# Patient Record
Sex: Male | Born: 1975 | Race: Black or African American | Hispanic: No | Marital: Single | Smoking: Never smoker
Health system: Southern US, Community
[De-identification: ages and names within clinical notes are randomized; demographics above are authoritative.]

## PROBLEM LIST (undated history)

## (undated) DIAGNOSIS — R569 Unspecified convulsions: Secondary | ICD-10-CM

## (undated) DIAGNOSIS — F70 Mild intellectual disabilities: Secondary | ICD-10-CM

## (undated) DIAGNOSIS — I10 Essential (primary) hypertension: Secondary | ICD-10-CM

## (undated) DIAGNOSIS — D332 Benign neoplasm of brain, unspecified: Secondary | ICD-10-CM

## (undated) HISTORY — PX: BRAIN SURGERY: SHX531

## (undated) HISTORY — DX: Essential (primary) hypertension: I10

## (undated) HISTORY — DX: Mild intellectual disabilities: F70

## (undated) HISTORY — DX: Unspecified convulsions: R56.9

## (undated) HISTORY — DX: Benign neoplasm of brain, unspecified: D33.2

---

## 1998-02-04 ENCOUNTER — Encounter: Admission: RE | Admit: 1998-02-04 | Discharge: 1998-02-04 | Payer: Self-pay | Admitting: Family Medicine

## 1998-06-10 ENCOUNTER — Encounter: Admission: RE | Admit: 1998-06-10 | Discharge: 1998-06-10 | Payer: Self-pay | Admitting: Sports Medicine

## 1998-07-05 ENCOUNTER — Emergency Department (HOSPITAL_COMMUNITY): Admission: EM | Admit: 1998-07-05 | Discharge: 1998-07-05 | Payer: Self-pay | Admitting: Emergency Medicine

## 1998-07-09 ENCOUNTER — Inpatient Hospital Stay (HOSPITAL_COMMUNITY): Admission: RE | Admit: 1998-07-09 | Discharge: 1998-07-12 | Payer: Self-pay | Admitting: Otolaryngology

## 1998-10-16 ENCOUNTER — Emergency Department (HOSPITAL_COMMUNITY): Admission: EM | Admit: 1998-10-16 | Discharge: 1998-10-16 | Payer: Self-pay | Admitting: Emergency Medicine

## 1998-11-26 ENCOUNTER — Encounter: Admission: RE | Admit: 1998-11-26 | Discharge: 1998-11-26 | Payer: Self-pay | Admitting: Family Medicine

## 1999-04-17 ENCOUNTER — Encounter: Admission: RE | Admit: 1999-04-17 | Discharge: 1999-04-17 | Payer: Self-pay | Admitting: Family Medicine

## 1999-06-02 ENCOUNTER — Encounter: Admission: RE | Admit: 1999-06-02 | Discharge: 1999-06-02 | Payer: Self-pay | Admitting: Family Medicine

## 1999-06-07 ENCOUNTER — Emergency Department (HOSPITAL_COMMUNITY): Admission: EM | Admit: 1999-06-07 | Discharge: 1999-06-07 | Payer: Self-pay | Admitting: Emergency Medicine

## 1999-06-09 ENCOUNTER — Encounter: Admission: RE | Admit: 1999-06-09 | Discharge: 1999-06-09 | Payer: Self-pay | Admitting: Family Medicine

## 1999-07-21 ENCOUNTER — Encounter: Admission: RE | Admit: 1999-07-21 | Discharge: 1999-07-21 | Payer: Self-pay | Admitting: Family Medicine

## 1999-08-12 ENCOUNTER — Encounter: Payer: Self-pay | Admitting: Pediatrics

## 1999-08-12 ENCOUNTER — Ambulatory Visit (HOSPITAL_COMMUNITY): Admission: RE | Admit: 1999-08-12 | Discharge: 1999-08-12 | Payer: Self-pay | Admitting: Pediatrics

## 2001-10-31 ENCOUNTER — Emergency Department (HOSPITAL_COMMUNITY): Admission: EM | Admit: 2001-10-31 | Discharge: 2001-10-31 | Payer: Self-pay | Admitting: Emergency Medicine

## 2002-02-06 ENCOUNTER — Emergency Department (HOSPITAL_COMMUNITY): Admission: EM | Admit: 2002-02-06 | Discharge: 2002-02-06 | Payer: Self-pay

## 2002-05-02 ENCOUNTER — Emergency Department (HOSPITAL_COMMUNITY): Admission: EM | Admit: 2002-05-02 | Discharge: 2002-05-02 | Payer: Self-pay | Admitting: Emergency Medicine

## 2004-11-11 ENCOUNTER — Ambulatory Visit: Payer: Self-pay | Admitting: Family Medicine

## 2004-11-13 ENCOUNTER — Ambulatory Visit (HOSPITAL_COMMUNITY): Admission: RE | Admit: 2004-11-13 | Discharge: 2004-11-13 | Payer: Self-pay | Admitting: Family Medicine

## 2004-11-25 ENCOUNTER — Ambulatory Visit: Payer: Self-pay | Admitting: Family Medicine

## 2005-05-06 ENCOUNTER — Inpatient Hospital Stay (HOSPITAL_COMMUNITY): Admission: EM | Admit: 2005-05-06 | Discharge: 2005-05-09 | Payer: Self-pay | Admitting: Emergency Medicine

## 2005-05-06 ENCOUNTER — Emergency Department (HOSPITAL_COMMUNITY): Admission: EM | Admit: 2005-05-06 | Discharge: 2005-05-06 | Payer: Self-pay | Admitting: Emergency Medicine

## 2005-05-12 ENCOUNTER — Ambulatory Visit: Payer: Self-pay | Admitting: Infectious Diseases

## 2005-05-12 ENCOUNTER — Inpatient Hospital Stay (HOSPITAL_COMMUNITY): Admission: EM | Admit: 2005-05-12 | Discharge: 2005-06-08 | Payer: Self-pay | Admitting: Emergency Medicine

## 2006-07-01 ENCOUNTER — Emergency Department (HOSPITAL_COMMUNITY): Admission: EM | Admit: 2006-07-01 | Discharge: 2006-07-01 | Payer: Self-pay | Admitting: Emergency Medicine

## 2006-10-21 ENCOUNTER — Emergency Department (HOSPITAL_COMMUNITY): Admission: EM | Admit: 2006-10-21 | Discharge: 2006-10-21 | Payer: Self-pay | Admitting: Emergency Medicine

## 2006-10-27 ENCOUNTER — Emergency Department (HOSPITAL_COMMUNITY): Admission: EM | Admit: 2006-10-27 | Discharge: 2006-10-27 | Payer: Self-pay | Admitting: Emergency Medicine

## 2006-11-16 ENCOUNTER — Emergency Department (HOSPITAL_COMMUNITY): Admission: EM | Admit: 2006-11-16 | Discharge: 2006-11-16 | Payer: Self-pay | Admitting: Emergency Medicine

## 2006-11-16 ENCOUNTER — Encounter: Payer: Self-pay | Admitting: Internal Medicine

## 2006-12-14 ENCOUNTER — Emergency Department (HOSPITAL_COMMUNITY): Admission: EM | Admit: 2006-12-14 | Discharge: 2006-12-14 | Payer: Self-pay | Admitting: Emergency Medicine

## 2006-12-16 ENCOUNTER — Emergency Department (HOSPITAL_COMMUNITY): Admission: EM | Admit: 2006-12-16 | Discharge: 2006-12-16 | Payer: Self-pay | Admitting: Emergency Medicine

## 2007-01-28 ENCOUNTER — Emergency Department (HOSPITAL_COMMUNITY): Admission: EM | Admit: 2007-01-28 | Discharge: 2007-01-28 | Payer: Self-pay | Admitting: Family Medicine

## 2007-01-28 ENCOUNTER — Encounter: Payer: Self-pay | Admitting: Internal Medicine

## 2007-02-08 ENCOUNTER — Ambulatory Visit: Payer: Self-pay | Admitting: Family Medicine

## 2007-02-28 ENCOUNTER — Encounter: Payer: Self-pay | Admitting: Internal Medicine

## 2007-02-28 ENCOUNTER — Encounter (INDEPENDENT_AMBULATORY_CARE_PROVIDER_SITE_OTHER): Payer: Self-pay | Admitting: Specialist

## 2007-02-28 ENCOUNTER — Observation Stay (HOSPITAL_COMMUNITY): Admission: RE | Admit: 2007-02-28 | Discharge: 2007-02-28 | Payer: Self-pay | Admitting: Neurosurgery

## 2007-05-05 ENCOUNTER — Ambulatory Visit: Payer: Self-pay | Admitting: Family Medicine

## 2007-05-13 ENCOUNTER — Encounter: Payer: Self-pay | Admitting: Internal Medicine

## 2007-05-20 ENCOUNTER — Encounter: Payer: Self-pay | Admitting: Internal Medicine

## 2007-05-26 ENCOUNTER — Ambulatory Visit: Payer: Self-pay | Admitting: Internal Medicine

## 2007-06-17 ENCOUNTER — Encounter: Payer: Self-pay | Admitting: Internal Medicine

## 2007-06-22 DIAGNOSIS — T85738A Infection and inflammatory reaction due to other nervous system device, implant or graft, initial encounter: Secondary | ICD-10-CM | POA: Insufficient documentation

## 2007-06-23 ENCOUNTER — Ambulatory Visit: Payer: Self-pay | Admitting: Family Medicine

## 2007-06-28 ENCOUNTER — Ambulatory Visit: Payer: Self-pay | Admitting: Internal Medicine

## 2007-06-28 DIAGNOSIS — G40209 Localization-related (focal) (partial) symptomatic epilepsy and epileptic syndromes with complex partial seizures, not intractable, without status epilepticus: Secondary | ICD-10-CM

## 2007-06-28 DIAGNOSIS — I619 Nontraumatic intracerebral hemorrhage, unspecified: Secondary | ICD-10-CM

## 2007-06-28 DIAGNOSIS — Z9889 Other specified postprocedural states: Secondary | ICD-10-CM | POA: Insufficient documentation

## 2007-06-28 DIAGNOSIS — G911 Obstructive hydrocephalus: Secondary | ICD-10-CM

## 2007-07-07 ENCOUNTER — Inpatient Hospital Stay (HOSPITAL_COMMUNITY): Admission: RE | Admit: 2007-07-07 | Discharge: 2007-07-08 | Payer: Self-pay | Admitting: Neurosurgery

## 2007-07-07 ENCOUNTER — Encounter: Payer: Self-pay | Admitting: Internal Medicine

## 2007-07-15 ENCOUNTER — Encounter: Payer: Self-pay | Admitting: Internal Medicine

## 2007-07-21 ENCOUNTER — Ambulatory Visit (HOSPITAL_COMMUNITY): Admission: RE | Admit: 2007-07-21 | Discharge: 2007-07-21 | Payer: Self-pay | Admitting: Neurosurgery

## 2007-07-29 ENCOUNTER — Encounter: Payer: Self-pay | Admitting: Internal Medicine

## 2007-08-12 ENCOUNTER — Encounter: Payer: Self-pay | Admitting: Internal Medicine

## 2007-09-05 DIAGNOSIS — H5310 Unspecified subjective visual disturbances: Secondary | ICD-10-CM | POA: Insufficient documentation

## 2007-09-05 DIAGNOSIS — I10 Essential (primary) hypertension: Secondary | ICD-10-CM | POA: Insufficient documentation

## 2007-09-05 DIAGNOSIS — F70 Mild intellectual disabilities: Secondary | ICD-10-CM | POA: Insufficient documentation

## 2007-09-05 DIAGNOSIS — H4720 Unspecified optic atrophy: Secondary | ICD-10-CM | POA: Insufficient documentation

## 2007-10-11 ENCOUNTER — Encounter: Payer: Self-pay | Admitting: Infectious Diseases

## 2008-04-13 ENCOUNTER — Emergency Department (HOSPITAL_COMMUNITY): Admission: EM | Admit: 2008-04-13 | Discharge: 2008-04-13 | Payer: Self-pay | Admitting: Emergency Medicine

## 2008-05-04 ENCOUNTER — Ambulatory Visit: Payer: Self-pay | Admitting: Family Medicine

## 2008-05-04 LAB — CONVERTED CEMR LAB
Albumin: 4.6 g/dL (ref 3.5–5.2)
BUN: 14 mg/dL (ref 6–23)
Calcium: 9.6 mg/dL (ref 8.4–10.5)
Chloride: 100 meq/L (ref 96–112)
Creatinine, Ser: 0.97 mg/dL (ref 0.40–1.50)
Eosinophils Absolute: 0 10*3/uL (ref 0.0–0.7)
Glucose, Bld: 85 mg/dL (ref 70–99)
HDL: 47 mg/dL (ref >39)
Hemoglobin: 13.7 g/dL (ref 13.0–17.0)
Lymphs Abs: 1.3 10*3/uL (ref 0.7–4.0)
MCHC: 32.4 g/dL (ref 30.0–36.0)
MCV: 88.5 fL (ref 78.0–100.0)
Monocytes Absolute: 0.3 10*3/uL (ref 0.1–1.0)
Monocytes Relative: 7 % (ref 3–12)
Neutro Abs: 2.5 10*3/uL (ref 1.7–7.7)
Neutrophils Relative %: 61 % (ref 43–77)
Potassium: 3.8 meq/L (ref 3.5–5.3)
RBC: 4.78 M/uL (ref 4.22–5.81)
Total CHOL/HDL Ratio: 3.9
Triglycerides: 103 mg/dL (ref <150)
WBC: 4.2 10*3/uL (ref 4.0–10.5)

## 2008-05-30 ENCOUNTER — Ambulatory Visit (HOSPITAL_BASED_OUTPATIENT_CLINIC_OR_DEPARTMENT_OTHER): Admission: RE | Admit: 2008-05-30 | Discharge: 2008-05-30 | Payer: Self-pay | Admitting: Orthopedic Surgery

## 2008-11-09 ENCOUNTER — Ambulatory Visit: Payer: Self-pay | Admitting: Family Medicine

## 2009-07-15 ENCOUNTER — Emergency Department (HOSPITAL_COMMUNITY): Admission: EM | Admit: 2009-07-15 | Discharge: 2009-07-15 | Payer: Self-pay | Admitting: Family Medicine

## 2010-07-23 ENCOUNTER — Emergency Department (HOSPITAL_COMMUNITY): Admission: EM | Admit: 2010-07-23 | Discharge: 2010-07-23 | Payer: Self-pay | Admitting: Family Medicine

## 2010-08-08 ENCOUNTER — Ambulatory Visit (HOSPITAL_COMMUNITY): Admission: RE | Admit: 2010-08-08 | Discharge: 2010-08-09 | Payer: Self-pay | Admitting: Otolaryngology

## 2010-08-08 ENCOUNTER — Encounter (INDEPENDENT_AMBULATORY_CARE_PROVIDER_SITE_OTHER): Payer: Self-pay | Admitting: Otolaryngology

## 2010-12-03 ENCOUNTER — Inpatient Hospital Stay (INDEPENDENT_AMBULATORY_CARE_PROVIDER_SITE_OTHER)
Admission: RE | Admit: 2010-12-03 | Discharge: 2010-12-03 | Disposition: A | Discharge status: Home / Self Care | Payer: Medicare Other | Source: Ambulatory Visit | Attending: Emergency Medicine | Admitting: Emergency Medicine

## 2010-12-03 DIAGNOSIS — L259 Unspecified contact dermatitis, unspecified cause: Secondary | ICD-10-CM

## 2011-01-07 LAB — BASIC METABOLIC PANEL
CO2: 32 mEq/L (ref 19–32)
Chloride: 99 mEq/L (ref 96–112)
GFR calc non Af Amer: >60 mL/min (ref >60)
Glucose, Bld: 92 mg/dL (ref 70–99)
Potassium: 4.4 mEq/L (ref 3.5–5.1)
Sodium: 139 mEq/L (ref 135–145)

## 2011-01-07 LAB — CBC
HCT: 39 % (ref 39.0–52.0)
MCH: 29 pg (ref 26.0–34.0)
MCV: 87.1 fL (ref 78.0–100.0)
RBC: 4.48 MIL/uL (ref 4.22–5.81)
WBC: 3.6 10*3/uL — ABNORMAL LOW (ref 4.0–10.5)

## 2011-01-07 LAB — DIFFERENTIAL
Eosinophils Relative: 1 % (ref 0–5)
Lymphocytes Relative: 40 % (ref 12–46)
Lymphs Abs: 1.4 10*3/uL (ref 0.7–4.0)
Monocytes Absolute: 0.4 10*3/uL (ref 0.1–1.0)
Monocytes Relative: 10 % (ref 3–12)

## 2011-01-07 LAB — SURGICAL PCR SCREEN: Staphylococcus aureus: NEGATIVE

## 2011-03-10 NOTE — Op Note (Signed)
NAMEADRIN, JULIAN              ACCOUNT NO.:  [id number]   MEDICAL RECORD NO.:  [id number]          PATIENT TYPE:  AMB   LOCATION:  DSC                          FACILITY:  [facility]   PHYSICIAN:  Artist Pais. 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   PROCEDURE:  Right carpal tunnel release and right index and long finger  A1 pulley releases.   SURGEON:  Artist Pais. Mina Marble, MD   ASSISTANT:  None.   ANESTHESIA:  General.   TOURNIQUET TIME:  25 minutes.   No complications.   No drains.   OPERATIVE REPORT:  The patient was taken to the operating suite.  After  the induction of adequate general anesthesia, right upper extremity was  prepped and draped in the usual sterile fashion.  An Esmarch was used to  exsanguinate the limb.  Tourniquet was then inflated to 250 mmHg.  At  this point in time, a 2-cm incision was made on the palmar aspect of the  right hand in line along the metacarpal starting Kaplan cardinal line.  Skin was incised.  Palmar fascia was identified and split.  The distal  edge of the transverse carpal ligament was identified and was split with  a #15 blade.  Median nerve was identified and protected with a Market researcher.  Remaining aspects of the transverse carpal ligament was  divided under direct vision using curved blunt scissors.  Canal was  inspected.  There were no osseous lesions or ganglions present.  It was  irrigated and loosely closed with a 3-0 Prolene subcuticular stitch.  A  second and third incisions were then made over the A1 pulley areas of  the index and long finger transversely over the A1 pulley.  Neurovascular bundles were identified and retracted.  The A1 pulley was  split, both the index and long finger.  The FDS and FDP tendons were  lysed all adhesions.  These wounds were irrigated and  loosely closed with 3-0 Prolene horizontal mattress sutures x2 for this  incision.  Steri-Strips, 4x4 fluffs, and a compressive dressing was  applied.  The patient tolerated all three procedures well and went to  recovery room in stable fashion.      Artist Pais Mina Marble, M.D.  Electronically Signed     MAW/MEDQ  D:  05/30/2008  T:  05/30/2008  Job:  [id number]

## 2011-03-10 NOTE — Op Note (Signed)
[name], [name]              ACCOUNT NO.:  [id number]   MEDICAL RECORD NO.:  [id number]          PATIENT TYPE:  INP   LOCATION:  3041                         FACILITY:  [facility]   PHYSICIAN:  Cristi Loron, M.D.DATE OF BIRTH:  06-Mar-1976   DATE OF PROCEDURE:  07/07/2007  DATE OF DISCHARGE:                               OPERATIVE REPORT   BRIEF HISTORY:  The patient is a 35 year old black male who was born  with an intracerebral bleed.  He had multiple shunts placed with  infections.  He has an old shunt in place that has been in there for  many years, placed by another physician.  This shunt has eroded through  the skin and is infected.  His more recent shunt is functioning fine.  We discussed various treatment options.  I had him see the infectious  disease doctors, they did not think there was anyway to clear the  infection with removal of the old shunt.  I therefore discussed that  surgery with him and he is aware of the risks, benefits, and  alternatives of surgery and wished to proceed with removal of the old  shunt.   PREOPERATIVE DIAGNOSIS:  Infected nonfunctioning residual peritoneal  catheter.   POSTOPERATIVE DIAGNOSIS:  Infected nonfunctioning residual peritoneal  catheter.   PROCEDURE:  Removal of an old infected peritoneal catheter.   ASSISTANT:  None.   ANESTHESIA:  General endotracheal.   ESTIMATED BLOOD LOSS:  Minimal.   SPECIMENS:  None.   CULTURES:  None.   DRAINS:  None.   COMPLICATIONS:  None.   DESCRIPTION OF PROCEDURE:  The patient was brought to operating room by  anesthesia team with general endotracheal anesthesia was induced.  The  patient remained in a supine position.  A roll was placed under his left  shoulder and his head turned towards the right.  His left occipital and  suboccipital regions were then shaved.  This region as well as his neck,  thorax, and abdomen was then prepared with Betadine and scrub with  Betadine solution.   Sterile drapes were applied and then injected it to  be incised with Marcaine with epinephrine solution.  The patient's old  shunt had erode through the skin and there was granulated tissue around  it.  I then made an incision just proximal to this along the track of  the old shunt and used electrocautery to expose the old shunt.  As we  found with other operations, the patient's shunt catheter was quite  calcified, and was quite adherent to the surrounding scar tissue.  I  dissected it free following the proximal course of the old catheter.  We  began approaching the area where his new functioning shunt, peritoneal  catheter crossed over his old catheter.  Therefore, we stopped at that  point and made an incision in the patient's upper neck suboccipital  region, i.e., cephalad to the spot where the new catheter crossed over  and then I used Metzenbaum scissors and electrocautery to expose the  more proximal old catheter.  Again, I dissected it free and we removed  this quite adherent calcified and scarred shunt.  I think we got the  most of the shunt both proximally and distally to where the new shunt  catheter crossed over and it was possible that there was some shunt  catheter underneath the new catheter and for fear of the infecting the  new catheter, we decided at that point it was the time to quit.  We then  obtained hemostasis, using electrocautery, irrigated the wound with  bacitracin solution and then reapproximated the patient's subcutaneous  tissue with interrupted 2-0 Vicryl suture, the skin with stainless steel  staples.  The wound was then coated with bacitracin ointment, sterile  dressings were applied.  Drapes were removed.  The patient was  subsequently extubated by the anesthesia team and transported to post  anesthesia care unit in a stable condition.  All sponge, instrument, and  needle counts were correct at the end of the case.      Cristi Loron, M.D.   Electronically Signed     JDJ/MEDQ  D:  07/07/2007  T:  07/08/2007  Job:  [id number]

## 2011-03-13 NOTE — Op Note (Signed)
NAMECAROLINA, ANTOLIN NO.:  [id number]   MEDICAL RECORD NO.:  [id number]          PATIENT TYPE:  INP   LOCATION:  3108                         FACILITY:  [facility]   PHYSICIAN:  Hewitt Shorts, M.D.DATE OF BIRTH:  September 04, 1976   DATE OF PROCEDURE:  05/13/2005  DATE OF DISCHARGE:                                 OPERATIVE REPORT   PREOPERATIVE DIAGNOSIS:  Shunt infection.   POSTOPERATIVE DIAGNOSIS:  Shunt infection.   PROCEDURE:  Externalization of distal ventriculoperitoneal shunt catheter.   SURGEON:  Hewitt Shorts, M.D.   ANESTHESIA:  Xylocaine 1% local anesthetic.   INDICATIONS FOR PROCEDURE:  The patient is a 35 year old male 6 days status  post placement of a new ventriculoperitoneal shunt by Dr. Delma Officer. The  patient presented to the emergency room yesterday with shunt malfunction.  CSF was aspirated from the reservoir and laboratories were consistent with  shunt infection. Antibiotic therapy was initiated and a decision was made to  externalize the distal catheter.   DESCRIPTION OF PROCEDURE:  At the patient's bedside, the left side of his  neck and chest were prepped with Betadine solution. The previous intervening  incision of Dr. Lovell Sheehan was reopened to the subcutaneous tissue and the  shunt catheter was identified and gently mobilized from the peritoneal  cavity and brought out through the incision. The incision, itself, was about  3-4 mm in length. The distal catheter was connected to an external drainage  collection system (closed). It was secured with an 0-silk tie. A single 3-0  nylon suture was placed to close the incision and a sterile dressing with  gauze and Tegaderm was applied. Good CSF flow was noted and the procedure  was tolerated well. Estimated blood loss was nil.       RWN/MEDQ  D:  05/13/2005  T:  05/14/2005  Job:  [id number]

## 2011-03-13 NOTE — Op Note (Signed)
NAMEGEORDAN, XU NO.:  [id number]   MEDICAL RECORD NO.:  [id number]          PATIENT TYPE:  INP   LOCATION:  1829                         FACILITY:  [facility]   PHYSICIAN:  Hewitt Shorts, M.D.DATE OF BIRTH:  1975/11/10   DATE OF PROCEDURE:  05/12/2005  DATE OF DISCHARGE:                                 OPERATIVE REPORT   PREOPERATIVE DIAGNOSIS:  Hydrocephalus   POSTOPERATIVE DIAGNOSIS:  Hydrocephalus   PROCEDURE:  Ventriculoperitoneal shunt tap.   SURGEON:  Hewitt Shorts, M.D.   ANESTHESIA:  None.   INDICATION:  The patient is a 35 year old man, 5 days status post recreation  of a new ventriculoperitoneal shunt.  The patient is having clinical  difficulty suggestive of inadequate shunt function and possible shunt  malfunction and a decision was made to tap the ventriculoperitoneal shunt.   PROCEDURE:  The scalp was examined and the area of the shunt reservoir was  identified.  The area was prepped with Betadine solution and draped in  sterile fashion, and then a 23-gauge butterfly needle was passed into the  ventriculoperitoneal shunt reservoir.  We easily aspirated xanthochromic  CSF; however, the pressure was low.  We gently aspirated a total of 30 mL of  mildly xanthochromic CSF.  The patient tolerated the procedure well and the  area of the scalp which had been bulging through a craniectomy defect became  decompressed and gently sunken.  The cerebrospinal fluid was sent for  laboratories including glucose, protein, cell count, culture and Gram's  stain.       RWN/MEDQ  D:  05/12/2005  T:  05/13/2005  Job:  [id number]

## 2011-03-13 NOTE — H&P (Signed)
[name], [name] NO.:  [id number]   MEDICAL RECORD NO.:  [id number]          PATIENT TYPE:  INP   LOCATION:  1829                         FACILITY:  [facility]   PHYSICIAN:  Hewitt Shorts, M.D.DATE OF BIRTH:  [birth year]   DATE OF ADMISSION:  05/12/2005  DATE OF DISCHARGE:                                HISTORY & PHYSICAL   HISTORY OF PRESENT ILLNESS:  The patient is a 35 year old black male who  presents with nausea, vomiting, and bulging of his scalp.   His history is that he was born with intracerebral hemorrhage and underwent  surgery at [age] day of age. He eventually had a ventricular peritoneal shunt  placed and apparently had a number of further surgeries with complications  of infections, shunt externalizations, and shunt replacements. His care was  initially by Dr. Ples Specter and then subsequently by Dr. Dalene Seltzer.   The patient had done well up until last week when he had a history of  headache for several days. He then developed nausea and vomiting but he  presented on May 06, 2005 to the emergency room because his mother had  noticed bulging of his scalp. CT scan of the brain was obtained and revealed  enlarged left posterior area of porencephaly with bulging of the  porencephaly through a left lateral cranial defect. A previous CT scan done  on January of 2006 was compared and showed significant increase in  ventricular size consistent with hydrocephalus consistent with ventricular  peritoneal shunt malfunction.   The patient was seen in neurosurgical consultation by Dr. Cristi Loron  who admitted the patient and dictated a complete admission note. The  following day, Dr. Lovell Sheehan created a new left occipital ventricular  peritoneal shunt using a Codman programmable valve set at 100 mmHg. The old  shunt valve was removed. Surgery was done on May 07, 2005. The patient was  subsequently discharged by Dr. Danielle Dess on May 09, 2005.   The patient's mother called the office today concerned that the shunt was  not functioning well and the patient was having difficulties. He has been  having difficulties with nausea and vomiting again and again bulging of the  scalp in the area of the craniectomy defect. We requested that the patient  and his mom come in to the emergency room so that we can evaluate him.  Laboratories, CT scan, and shunt series were obtained.   PAST MEDICAL HISTORY:  Significant for seizure disorder. He had been  followed by Dr. Deanna Artis. Hickling for an extended period of time. At this  point, he is followed by [facility].   PAST SURGICAL HISTORY:  1.  Craniectomy for intracerebral hemorrhage.  2.  Multiple shunt procedures; the most recent five days ago by Dr. Lovell Sheehan.   MEDICATIONS:  1.  Tegretol XL 400 mg b.i.d.  2.  Neurontin __________ mg t.i.d.  3.  Depakote 500 mg b.i.d.   ALLERGIES:  No allergies to medications.   FAMILY HISTORY:  His mother is age 56. She has diabetes and hypertension.  The patient's father died in  his 37s from prostate cancer.   SOCIAL HISTORY:  The patient is unmarried. He is employed. He [place] He has two sons. He does not smoke  tobacco, drink alcoholic beverages, or have a history of substance abuse.   REVIEW OF SYSTEMS:  Notable for what was described in history of present  illness, past medical history but is otherwise unremarkable.   PHYSICAL EXAMINATION:  GENERAL:  The patient is a well-developed, well-  nourished black male in no acute distress.  VITAL SIGNS:  Temperature 97.9, pulse 87, blood pressure 138/86.  LUNGS:  Clear to auscultation. He has symmetrical respiratory excursion.  HEART:  Regular rate and rhythm. S1 and S2, no murmur.  ABDOMEN:  Soft, nontender, and nondistended. Bowel sounds are present.  SKIN:  Shows incisions that are clean, dry and healing well.  NEUROLOGICAL:  Mental status:   The patient is awake and alert. He is  oriented to his name, [facility], and July 2006. He is  following commands. Speech is fluent. He has good comprehension. Cranial  nerves:  Right exotropia. Extraocular movements intact. He has right facial  weakness. Motor examination:  Shows 5/5 strength in the upper and lower  extremities. He has no drift to the upper extremities. Sensation:  Intact to  pinprick throughout.   CT scan of the brain without contrast shows a large left posterior  porencephaly bulging through a left lateral craniectomy defect. The  hydrocephalus is slightly less prominent than that seen preoperatively on  May 06, 2005. There is a small epidural hematoma at the burr hole site for  the new ventricular shunt catheter.   IMPRESSION:  Hydrocephalus not fully decompressed, possibly due to the  setting of the programmable shunt which may need to be lower because of the  area of porencephaly as well as the area of the cranial defect may cause the  pressure to be relatively low. On the other hand, it is possible that there  is a ventricular peritoneal shunt malfunctioning. Nausea and vomiting are  present. Laboratories most notably show hyponatremia with a sodium of 126.   PLAN:  We will proceed with tapping of ventricular peritoneal shunt to  decompress the ventricular system and to assess the cerebral spinal fluid.  We will get a glucose, protein, cell count, Gram-stain, and culture.  Intravenous fluids will be initiated and the patient will be kept NPO except  for his seizure medications and we will reprogram the shunt to a lower  opening pressure.       RWN/MEDQ  D:  05/12/2005  T:  05/13/2005  Job:  [id number]   cc:   Patient's chart

## 2011-03-13 NOTE — Op Note (Signed)
NAMETEANDRE, HAMRE NO.:  [id number]   MEDICAL RECORD NO.:  [id number]          PATIENT TYPE:  INP   LOCATION:  3108                         FACILITY:  [facility]   PHYSICIAN:  Hewitt Shorts, M.D.DATE OF BIRTH:  1976-01-29   DATE OF PROCEDURE:  05/15/2005  DATE OF DISCHARGE:                                 OPERATIVE REPORT   PREOPERATIVE DIAGNOSIS:  Shunt malfunction.   POSTOPERATIVE DIAGNOSIS:  Shunt malfunction.   PROCEDURE:  Reprogramming of Codman programmable shunt.   SURGEON:  Hewitt Shorts, M.D.   INDICATIONS:  The patient is a 35 year old male admitted three days ago with  shunt malfunction and was found to have a shunt infection.  The shunt was  externalized.  At this time it was found to be over-draining, and it is  being reprogrammed from its current setting of 60 mmHg opening pressure.   PROCEDURE:  At the patient's bedside, the shunt reservoir and valve were  localized and identified.  The shunt reprogrammer was set to 100 mmHg and  the shunt was reprogrammed.  The procedure was tolerated well.       RWN/MEDQ  D:  05/15/2005  T:  05/15/2005  Job:  [id number]

## 2011-03-13 NOTE — Discharge Summary (Signed)
[name], [name]              ACCOUNT NO.:  [id number]   MEDICAL RECORD NO.:  [id number]          PATIENT TYPE:  INP   LOCATION:  3002                         FACILITY:  [facility]   PHYSICIAN:  Coletta Memos, M.D.     DATE OF BIRTH:  1976/09/18   DATE OF ADMISSION:  05/12/2005  DATE OF DISCHARGE:  06/08/2005                                 DISCHARGE SUMMARY   ADMISSION DIAGNOSES:  1.  Shunt infection.  2.  Hydrocephalus.   DISCHARGE DIAGNOSES:  1.  Shunt infection.  2.  Hydrocephalus.   PROCEDURES:  1.  Reprogramming of Codman programmable ventriculoperitoneal shunt, May 12, 2005.  2.  Externalization of ventriculoperitoneal shunt, May 19, 2005.  3.  Isolate of Gram-negative rods, Citrobacter.   He was treated with 22 days of antibiotics and has his ventriculoperitoneal  shunt revised on June 03, 2005, where he had a creation of a new VP shunt  placed with initial setting of 80 mmHg, and these were the impregnated  catheters with Clindamycin and Rifampin.  Postoperatively, [name] has  done quite well.  He is alert and oriented by 4, speech is clear and fluent.  Physical exam shows he has normal strength in the upper and lower  extremities.  He has a completely sunken flap on the left side of his head.  His wounds are clean and dry without signs of infection.  He will have a  return appointment to see Dr. Lovell Sheehan next week for suture removal.  He is  doing quite well and having finished his antibiotics and continuing to  thrive, he will be discharged home.           ______________________________  Coletta Memos, M.D.     KC/MEDQ  D:  06/08/2005  T:  06/09/2005  Job:  [id number]

## 2011-03-13 NOTE — Op Note (Signed)
NAMEJOH, RAO NO.:  [id number]   MEDICAL RECORD NO.:  [id number]          PATIENT TYPE:  INP   LOCATION:  3108                         FACILITY:  [facility]   PHYSICIAN:  Cristi Loron, M.D.DATE OF BIRTH:  1976/07/29   DATE OF PROCEDURE:  06/03/2005  DATE OF DISCHARGE:                                 OPERATIVE REPORT   BRIEF HISTORY:  The patient is a 35 year old black male who has had a  ventriculoperitoneal shunt all his life.  He has had several revisions and  several infections.  I most recently revised his shunt about a month ago.  He was readmitted with an infection.  The patient was treated with the  appropriate antibiotics at the direction of the ID service.  His CSF is now  clear and I have discussed the treatment options with him and recommended he  undergo replacement of his ventricular peritoneal shunt.  The patient has  weighed the risks, benefits and alternatives of surgery and decided to  proceed with the operation.   PREOPERATIVE DIAGNOSIS:  Ventriculoperitoneal shunt malfunction/infection.   POSTOPERATIVE DIAGNOSIS:  Ventriculoperitoneal shunt malfunction/infection.   PROCEDURE:  Removal of his old ventriculoperitoneal shunt and creation of a  new ventriculoperitoneal shunt (Codman programmable valve with initial  setting of 80 mmHg, and the ventricular and peritoneal catheter were  impregnated with clindamycin and rifampin).   SURGEON:  Dr. Delma Officer.   ASSISTANT:  Dr. Coletta Memos.   ANESTHESIA:  General endotracheal.   ESTIMATED BLOOD LOSS:  50 mL.   SPECIMENS:  None.   DRAINS:  None.   COMPLICATIONS:  None.   DESCRIPTION OF PROCEDURE:  The patient was brought to the operating room by  the anesthesia team, general endotracheal anesthesia was induced.  Patient  remained in the supine position.  The head was turned to the right.  His  left scalp was then shaved with the clippers.  This and his neck and thorax  and  abdomen was prepared with Betadine scrub and Betadine solution.  Sterile  drapes were applied.  I then used a 15 blade scalpel to make a new incision  in the patient's left scalp (I did not want to go through the old incision  as he has had multiple surgeries through this incision.  It was quite sunken  and I thought it best to make a new linear incision).  I used electrocautery  to dissect down to the periosteum and to expose the old shunt and the  proximal ventricular catheter.  We then removed this shunt and then there  was free flow of CSF through the  tract of the dura.  I then plugged this  with some cotton patties.  We then made an incision in his right upper  quadrant of his abdomen (the patient had several incisions on his left upper  quadrant of his abdomen and with the last surgery there was some difficulty  getting in the abdomen because of some adhesions, so I decided to go in the  right side.  I then dissected through the anterior rectus sheath, ligated  the anterior rectus sheath, dissected through the rectus muscle and then  ligated the posterior rectus sheath and the perineum and entered into the  peritoneal cavity.  I could clearly see the liver, indicating we were in the  peritoneal cavity.  I then used a shunt passer to pass the peritoneal  catheter from the scalp incision down to the abdominal incision.  We  connected the peritoneal catheter to the valve and then cut the ventricular  catheter into 5 cm and then connected it to the Rickham reservoir.  We then  fashioned the connection with 0 silk ties.  We then removed the patties  covering over the previous hole in the dura and placed the ventricular  catheter into this hole and seated the valve onto the periosteum.  We did  not get a whole lot of flow of CSF through the distal end of the peritoneal  catheter, but I think this was largely because most of the spinal fluid had  leaked out and it was felt there was no  pressure at this point.  I then,  therefore, used a 26 gauge needle and a TB syringe to puncture the Rickham  reservoir and freely aspirate cerebral fluid through the ventricular  catheter, indicating its patency (I should mention that we had preset the  new valve to 80 mmHg prior to placing the valve).  We then reapproximated  the patient's galea with interrupted 2-0 Vicryl suture, the scalp with  stainless steel staples.  We then reapproximated patient's posterior and  anterior rectus sheath with interrupted #1 Vicryl sutures, the subcutaneous  tissue with 2-0 Vicryl suture and the skin with Steri-Strips with benzoin.  The wounds were then coated with Bacitracin ointment, a sterile dressing  applied, the drapes were removed and the patient was subsequently extubated  and transported to the Post Anesthesia Care Unit in stable condition.  All  sponge, instrument and needle counts were correct at the end of this case.      Cristi Loron, M.D.  Electronically Signed     JDJ/MEDQ  D:  06/03/2005  T:  06/03/2005  Job:  [id number]

## 2011-03-13 NOTE — Consult Note (Signed)
NAMEHAWKINS, [name]              ACCOUNT NO.:  [id number]   MEDICAL RECORD NO.:  [id number]          PATIENT TYPE:  INP   LOCATION:  3025                         FACILITY:  [facility]   PHYSICIAN:  Cristi Loron, M.D.DATE OF BIRTH:  [birth year]   DATE OF CONSULTATION:  05/06/2005  DATE OF DISCHARGE:                                   CONSULTATION   CHIEF COMPLAINT:  Headache, nausea and vomiting.   HISTORY OF PRESENT ILLNESS:  Patient is a 35 year old black male who was  born with intracerebral hemorrhage and according to his mother, underwent  surgery at one-day old.  He subsequently had a ventriculoperitoneal shunt  placed and it sounds like he has had a couple of infections and had the  shunts externalized and replaced.  His most recent revision was either done  by Dr. Vear Clock or Dr. Bruna Potter (patient's mother cannot remember who did the  surgery) approximately 15 years ago.  More recently, the patient had  approximately a five-day history of a slight headache.  He has had  nausea  and vomiting, but what concerned the mother was the fact that she noticed  the patient's scalp flap has begun  bulging.  She brought him to the  [facility] Emergency Department and he was evaluated by Dr. Bruce Donath.  Dr. Freida Busman got a cranial CT scan and requested neurosurgical  consultation that demonstrated worsening hydrocephalus.   PAST MEDICAL HISTORY:  Seizure disorder. Patient is followed by [facility].   PAST SURGICAL HISTORY:  As above.  Craniotomy for intracerebral hemorrhage  and patient had multiple shunt procedures.   MEDICATIONS:  1.  Toprol XL 400 mg b.i.d.  2.  Neurontin 2400 mg p.o. t.i.d.  3.  Depakote 500 mg p.o. b.i.d.   ALLERGIES:  No known drug allergies.   FAMILY HISTORY:  Patient's mother is age 34, she has diabetes mellitus and  hypertension.  Patient's father died in his low 72s secondary to prostate  cancer.   SOCIAL HISTORY:  The patient is single.  He is  employed.  He lives with his  mother.  [place]  He has two sons.  He  denies tobacco, ethanol or drug use.   REVIEW OF SYSTEMS:  Negative except as above.   PHYSICAL EXAMINATION:  GENERAL APPEARANCE:  A pleasant 35 year old black  male with dysarthric speech.  HEENT:  Patient has disconjugate gaze.  His pupils are equal, round and  reactive to light.  Extraocular muscles are intact.  His left posterior  temple paroccipital scalp flap is bulging.  No signs of infection.  NECK:  Supple.  No masses, deformities, tracheal deviation.  CARDIOVASCULAR:  Regular rate and rhythm.  ABDOMEN:  Soft and nontender.  Patient's abdominal shunt incision is well-  healed.  EXTREMITIES:  No obvious deformities.  NEUROLOGIC:  The patient is alert and oriented x3.  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  Motor strength is 5/5 in biceps, triceps,  hand grip, psoas, quadriceps, gastrocnemius, extensor hallucis longus.  Deep  tendon reflexes are somewhat hyperreflexic on the right but otherwise  unremarkable.  Sensory exam intact to light touch in all tested dermatomes  bilaterally.  Cerebellar function is intact to rapid alternating movements  of the upper extremities bilaterally.   IMAGING STUDIES:  I reviewed the patient's cranial CT scan performed without  contrast May 06, 2005, at [facility] and compared it with his  prior study of November 13, 2004.  It demonstrates the patient has an old  left parietal craniectomy with paroccipital encephalomalacia.  His  ventricles are enlarging compared with the prior study and there is more  bulging of the scalp flap.   ASSESSMENT/PLAN:  Ventriculoperitoneal shunt malfunction.  I have discussed  the situation with the patient and his mother.  I told them  I agree with his  mother's concern that this shunt is not working.  I discussed the various  treatment options with them including observation versus shunt revision.  I  will admit the patient and likely do a shunt revision tomorrow if they  consent.      Cristi Loron, M.D.  Electronically Signed     JDJ/MEDQ  D:  05/07/2005  T:  05/07/2005  Job:  [id number]

## 2011-03-13 NOTE — Consult Note (Signed)
NAMEWILMA, [name]              ACCOUNT NO.:  [id number]   MEDICAL RECORD NO.:  [id number]          PATIENT TYPE:  EMS   LOCATION:  MAJO                         FACILITY:  [facility]   PHYSICIAN:  Bevelyn Buckles. Champey, M.D.DATE OF BIRTH:  07/20/1976   DATE OF CONSULTATION:  DATE OF DISCHARGE:                                 CONSULTATION   REQUESTING PHYSICIAN:  Deanna Artis. Sharene Skeans, M.D.   REASON FOR CONSULTATION:  Seizures.   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  The patient had subsequent seizures from this.  He is  currently on Tegretol including Neurontin.  The patient stopped his  medications for several months and presented several days ago with a  seizure, and his medications were restarted.  He was discontinued home  after workup including shunt evaluation appeared intact.  The patient  presents today after 9:30 to 10:00 getting worsening numbness, weakness,  and slurred speech which is usually his aura prior to his seizure.  The  patient's typical seizure starts with these symptoms and then usually  progress to loss of consciousness with generalized aching and  foaming/drooling of the mouth.  He does not have any tongue biting or  incontinence today.  His symptoms did not progress and resolved within  one to two hours.  He is currently on Tegretol 400 mg twice a day,  Neurontin 800 mg t.i.d., and Depakote 500 mg twice a day and tolerating  these medications all without any symptoms.  The patient has seen Dr.  Sharene Skeans from Neurology in the past as an outpatient.  The patient  denies any headaches, new vision changes, vertigo, or episodes of loss  of consciousness today.   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   CURRENT  MEDICATIONS:  Tegretol, Neurontin, and Depakote.   ALLERGIES:  The patient has no known drug allergies.   FAMILY HISTORY:  Negative for seizures.  Positive for diabetes,  hypertension, and heart disease.   SOCIAL HISTORY:  The patient lives with his mom.  Denies any alcohol,  tobacco, or drug use.   REVIEW OF SYSTEMS:  Positive as per HPI.   REVIEW OF SYSTEMS:  Negative as per HPI in greater than eight other  organ systems.   PHYSICAL EXAMINATION:  VITAL SIGNS:  Temperature 97.9, blood pressure  162/95, pulse 96, respirations 16, O2 saturation is 99%.  HEENT:  The patient has a left skull indentation and defect secondary to  prior surgery, has a right exotropion.  NECK:  Supple.  HEART:  Regular.  LUNGS:  Clear.  ABDOMEN:  Soft.  EXTREMITIES:  Good pulses.  NEUROLOGIC:  The patient is awake and alert, following commands  appropriately and slightly dysarthric speech.  Cranial Nerves:  Has  right exotropia.  He has a right facial droop.  Right visual field cut.  Extraocular muscles appear intact.  Motor Examination:  The patient has  very subtle right hemiparesis with a slight right drift.  Sensory  examination is within normal limits to light touch.  Reflexes are 1 to  2+ throughout.  Gait is not assessed secondary to safety.   CT of the head showed old left temporoparietal encephalomalacia, and 2  VP shunts.  There is no acute findings.  MRI of the brain showed no  acute findings or stroke and postoperative changes.   LABORATORY DATA:  WBC is 7.1, hemoglobin 13.1, hematocrit is 39.2.  Platelets 242.  Sodium 142, potassium 4.2, chloride 105, CO2 33, BUN 10,  creatinine 0.87, glucose 94.  LFTs are within normal limits.  Calcium is  9.2.  Tegretol level is 14.1.  Valproic acid level is pending.   IMPRESSION:  This is a 35 year old African-American male with status  post brain injury and seizures.  The patient seems to have had his  typical aura this morning.  MRI did not show  any acute process or  change.  The patient has seen Dr. Sharene Skeans as an outpatient at [facility].  His valproic acid level is currently pending with an  elevated Tegretol.  The patient probably needs some adjustments in his  medications.  With elevated Tegretol level, will decrease his Tegretol  to 300 mg twice a day as he is currently on 400 mg twice a day.  Tegretol level can also decrease the level and the effect of valproic  acid.  Also elevated supratherapeutic drug levels can decrease seizure  threshold as well.  Will continue the Neurontin at 800 mg t.i.d. but  will await the valproic acid level to make decisions about adjusting the  Depakote level.  The patient will need drug levels checked in the next  few weeks.  He can follow up with Dr. Sharene Skeans or myself within the next  few weeks.  He is told to return to the emergency room if he has  increased frequency of seizures or increased duration.  Will follow  valproic acid levels and make adjustments if any if needed.      Bevelyn Buckles. Nash Shearer, M.D.  Electronically Signed     DRC/MEDQ  D:  12/16/2006  T:  12/17/2006  Job:  [id number]

## 2011-03-13 NOTE — Op Note (Signed)
NAMEPERSHING, SKIDMORE NO.:  [id number]   MEDICAL RECORD NO.:  [id number]          PATIENT TYPE:  INP   LOCATION:  3108                         FACILITY:  [facility]   PHYSICIAN:  Hewitt Shorts, M.D.DATE OF BIRTH:  10-31-1975   DATE OF PROCEDURE:  05/19/2005  DATE OF DISCHARGE:                                 OPERATIVE REPORT   DIAGNOSIS:  Shunt infection.   POSTOPERATIVE DIAGNOSIS:  Shunt infection.   PROCEDURE.:  Sampling of CSF from externalized ventriculoperitoneal shunt.   SURGEON:  Hewitt Shorts, M.D.   ANESTHESIA:  None.   INDICATIONS:  The patient is a 35 year old man who was admitted one week ago  with shunt malfunction that was found to the due to shunt infection. The  patient's shunt was subsequently externalized.  The patient has been on  antibiotics and we sampled CSF today to assess the response to antibiotic  therapy.   PROCEDURE:  At the patient's bedside, the external drainage system port was  prepped with isopropyl alcohol.  The distal three-way stopcock was turned  off and we prepped the port with isopropyl alcohol and then drew off 10 mL  of xanthochromic CSF. This was thrown away and then a second 10 mL sample of  the xanthochromic CSF was obtained without difficulty and was sent to the  laboratory for analysis including Gram stain, culture and sensitivity,  glucose, protein, cell count.  The procedure was tolerated well and the  three-way stopcock distally was reopened.       RWN/MEDQ  D:  05/19/2005  T:  05/19/2005  Job:  [id number]

## 2011-03-13 NOTE — Discharge Summary (Signed)
[name], [name] NO.:  [id number]   MEDICAL RECORD NO.:  [id number]          PATIENT TYPE:  INP   LOCATION:  3025                         FACILITY:  [facility]   PHYSICIAN:  Cristi Loron, M.D.DATE OF BIRTH:  1976-09-03   DATE OF ADMISSION:  05/06/2005  DATE OF DISCHARGE:  05/09/2005                                 DISCHARGE SUMMARY   BRIEF HISTORY:  The patient is a 35 year old black male who was born with an  intracerebral hemorrhage.  According to his mother, he underwent surgery at  1 day old.  He subsequently had a ventriculoperitoneal shunt placed.  It  sounds like he had a couple of infections and his shunt was externalized and  replaced.  His most recent revision was either done by Dr. Vear Clock or Dr.  Lynnette Caffey about 15 years ago.  More recently, the patient has developed a  five-day history of a slight headache.  He has had some nausea and vomiting  but the mother was concerned about the fact that she noticed the patient's  scalp flap began bulging.  She brought him to the [facility] Emergency  Department and was evaluated by Dr. Bruce Donath.  Dr. Freida Busman got a cranial CT  scan and requested a neurosurgical consultation.  The CT scan demonstrated  worsening hydrocephalus.  The patient was transferred to [facility]  for further treatment.   For further details of this admission, please refer to history and physical.   HOSPITAL COURSE:  I performed a ventriculoperitoneal shunt revision on the  patient at [facility] on May 07, 2005.  Surgery went well.  For  full details of this operation, please refer to typed operative note.   Postoperative course:  The patient's postoperative course was unremarkable.  He was discharged home on May 09, 2005.   DISCHARGE INSTRUCTIONS:  The patient was instructed to follow up with me in  one week.  He was given written discharge instructions.   DIAGNOSIS PRESCRIPTIONS:  Tylenol No. 3 p.r.n.   FINAL DIAGNOSES:  1.  Hydrocephalus.  2.  Ventriculoperitoneal shunt malfunction.  3.  Hypertension.   PROCEDURE:  Creation of a new ventriculoperitoneal shunt (Codman  programmable valve initially set at 100 mmHg with an approximately 5 cm  ventricular catheter; removal of the old shunt valve).      Cristi Loron, M.D.  Electronically Signed     JDJ/MEDQ  D:  07/02/2005  T:  07/02/2005  Job:  [id number]

## 2011-03-13 NOTE — Op Note (Signed)
NAMEZAKARIYAH, FREIMARK NO.:  [id number]   MEDICAL RECORD NO.:  [id number]          PATIENT TYPE:  INP   LOCATION:  1829                         FACILITY:  [facility]   PHYSICIAN:  Hewitt Shorts, M.D.DATE OF BIRTH:  Dec 23, 1975   DATE OF PROCEDURE:  05/12/2005  DATE OF DISCHARGE:                                 OPERATIVE REPORT   PREOPERATIVE DIAGNOSIS:  Hydrocephalus.   POSTOPERATIVE DIAGNOSIS:  Hydrocephalus.   PROCEDURE:  Reprogramming of CODMAN programmable ventriculoperitoneal shunt.   SURGEON:  Hewitt Shorts, M.D.   INDICATION:  The patient is a 35 year old male, 5 days status post placement  and creation of new left occipital ventriculoperitoneal shunt by Dr. Delma Officer.  The opening pressure was set at the time of surgery to 100 mmHg.  The patient presented with evidence of inadequate shunt function and  possible shunt malfunction and a decision was made to reprogram the shunt to  a lower opening pressure.   PROCEDURE:  At the patient's bedside, the reprogrammer was positioned over  the shunt valve; it was set to 60 mmHg and the shunt was reprogrammed to  open at 60 mmHg.  Procedure was tolerated well.       RWN/MEDQ  D:  05/12/2005  T:  05/13/2005  Job:  [id number]

## 2011-03-13 NOTE — Op Note (Signed)
NAMEADRIC, WREDE NO.:  [id number]   MEDICAL RECORD NO.:  [id number]          PATIENT TYPE:  INP   LOCATION:  3025                         FACILITY:  [facility]   PHYSICIAN:  Cristi Loron, M.D.DATE OF BIRTH:  23-Mar-1976   DATE OF PROCEDURE:  05/07/2005  DATE OF DISCHARGE:                                 OPERATIVE REPORT   BRIEF HISTORY:  The patient is a 35 year old male who had a neonatal  intracerebral hemorrhage, underwent a craniectomy and resection of a blood  clot at one day old.  He subsequently had a ventriculoperitoneal shunt  placed and had multiple infections and revisions.  His last revision was 10  or 15 years ago by another physician.  He was doing well until about five  days ago when he began having some headaches, nausea and vomiting.  His  mother noticed that he was having a lot of swelling at his craniectomy site  which was consistent with his prior shunt malfunctions.  She brought him to  the emergency department and a brain CT scan demonstrated enlargement of his  ventricles consistent with a shunt malfunction.  I discussed the various  treatment options with the patient and his mother and recommended that he  undergo a shunt revision or replacement.  The patient weighed the risks,  benefits and alternatives of surgery and decided to proceed with the  operation.   PREOPERATIVE DIAGNOSIS:  Ventriculoperitoneal shunt malfunction.   POSTOPERATIVE DIAGNOSIS:  Ventriculoperitoneal shunt malfunction.   OPERATION PERFORMED:  Creation of a new ventriculoperitoneal shunt (Codman  programmable valve initially set at 100 with approximately 5 cm ventricular  catheter), removal of the old shunt valve.   SURGEON:  Cristi Loron, M.D.   ASSISTANT:  Coletta Memos, M.D.   ANESTHESIA:  General endotracheal.   ESTIMATED BLOOD LOSS:  200 mL.   SPECIMENS:  None.   DRAINS:  None.   COMPLICATIONS:  None.   DESCRIPTION OF PROCEDURE:  The  patient was brought to the operating room by  the anesthesia team.  General endotracheal anesthesia was induced.  The  patient remained in supine position.  His left scalp, neck, thorax and  abdomen was then shaved and prepared with Betadine scrub and Betadine  solution.  Sterile drapes were applied.  We then used a scalpel to make  incision through the patient's left occipital incision.  We used  electrocautery to expose the previous shunt hardware.  The valve was  disconnected from the distal/peritoneal catheter.  We then attempted to  remove the ventricular catheter.  It was more or less stuck so we thought it  prudent to rather then try to remove it and possibly cause a bleed, to cut  the ventricular catheter.  It retracted back into the skull and there was no  release of CSF consistent with also a ventricular catheter obstruction as  well as the distal disconnection.,   We now decided to place a new ventriculoperitoneal shunt.  We created a  subcutaneous tunnel from the scalp incision to the abdominal incision.  Dr.  Franky Macho incised the abdominal skin,  subcutaneous tissue, rectus sheath to  the rectus muscle, posterior rectus sheath and then incised the peritoneum  entering into the peritoneal cavity.  We then passed the peritoneal cavity  down from the scalp incision down to the abdominal incision, placing a silk  tie.  We then created a left occipital bur hole.  We then coagulated the  dura with bipolar electrocautery and then upon doing this, there was release  of CSF under high pressure.  We then inserted approximately 5 cm catheter  into the porencephalic cyst in the left occipital region and there was free  flow of cerebral spinal fluid under high pressure.  We then connected this  catheter to the valve, secured it with the silk tie.  At this point there  was good spontaneous flow of cerebrospinal fluid through the distal end of  the peritoneal cavity.  We then placed the  peritoneal cavity into the  peritoneum.  We closed the anterior and posterior rectus sheath with an  interrupted 0 Vicryl suture, subcutaneous tissue with interrupted 2-0 Vicryl  suture and the skin with Steri-Strips and Benzoin.  We then secured the  shunt valve.  I should mention that we preset the shunt valve to 100 mm.  We  secured the shunt valve to the periosteum using a 2-0 Vicryl suture.  We  then copiously irrigated the wound out with bacitracin solution and then  reapproximated the patient's galea with interrupted 2-0 Vicryl suture, the  skin with stainless steel staples.  I should also mention that in passing  the catheter through a shunt passer, we made a second small incision in the  patient's neck.  We closed this with 2-0 Vicryl suture and Steri-Strips and  Benzoin as well.  The wounds were then coated with bacitracin ointment.  Sterile dressings were applied.  The patient was subsequently extubated by  the anesthesia team and transported to the post anesthesia care unit in  stable condition.  A sponge, needle and instrument counts were correct at  the end of this case.      Cristi Loron, M.D.  Electronically Signed     JDJ/MEDQ  D:  05/08/2005  T:  05/09/2005  Job:  [id number]

## 2011-03-13 NOTE — Op Note (Signed)
[name], [name] NO.:  [id number]   MEDICAL RECORD NO.:  [id number]          PATIENT TYPE:  INP   LOCATION:  2899                         FACILITY:  [facility]   PHYSICIAN:  Cristi Loron, M.D.DATE OF BIRTH:  07-06-1976   DATE OF PROCEDURE:  02/28/2007  DATE OF DISCHARGE:                               OPERATIVE REPORT   PREOPERATIVE DIAGNOSIS:  Infected, nonfunctioning ventriculoperitoneal  shunt.   POSTOPERATIVE DIAGNOSIS:  Infected, nonfunctioning ventriculoperitoneal  shunt.   PROCEDURE:  Partial removal of old, nonfunctioning ventriculoperitoneal  shunt.   SURGEON:  Cristi Loron, M.D.   ASSISTANT:  None.   ANESTHESIA:  General endotracheal.   ESTIMATED BLOOD LOSS:  Minimal.   SPECIMENS:  The shunt and cultures.   COMPLICATIONS:  None.   BRIEF HISTORY:  The patient is a 35 year old black male, who has  undergone prior ventriculoperitoneal shunting by other physicians.  The  patient presented with a shunt malfunction, and I replaced his  ventriculoperitoneal shunt in July 2006.  The patient has done well  since then, as far as his hydrocephalus goes.  He has, however,  developed an ulcer and an area of purulence over the tract of his old  left ventriculoperitoneal shunt.  His new ventriculoperitoneal shunt,  even when I placed it about 2 years ago, seems to be functioning well.  I discussed the various treatment options with the patient.  I did about  a year ago partially resect the infected shunt via local anesthesia, but  the purulence has recurred.  I discussed the situation with the patient,  and I recommended that we under general anesthesia attempt to remove  more of his calcified old shunt.  The patient weighed the risks,  benefits and alternatives of surgery and decided to proceed with the  operation.   DESCRIPTION OF PROCEDURE:  The patient was brought to the operating room  by the anesthesia team.  General endotracheal  anesthesia was induced.  The patient remained in supine position.  The patient's left  suboccipital region, neck, upper thorax and left upper abdominal  quadrant were then prepared with DuraPrep.  Then, we placed sterile  drapes.  I then injected the area to be incised with Marcaine with  epinephrine solution.  I made a scalpel incision just proximal to the  ulcerated lesion overlying the patient's old ventriculoperitoneal shunt  in the patient's left pectoral region.  I then dissected down to the  shunt using electrocautery.  The shunt was quite calcified, i.e., there  was quite a bit of calcium surrounding the shunt.  I was able to remove  part of the more proximal peritoneal catheter by traction, removing  approximately 10 cm of the more proximal peritoneal catheter.  Distally,  the shunt catheter was quite tethered by this ulcerated area of what  appeared to be calcified granulation tissue.  I used electrocautery to  remove as much of the distal shunt as I could, again, approximately  another 5 cm, and then the distal catheter snapped.  I then obtained  cultures of the wound.  We then also sent off  the shunt and the presumed  granuloma out to the pathologist for identification.  I then obtained  hemostasis using bipolar electrocautery.  I irrigated the wound out with  Bacitracin solution.  I then used interrupted 2-0 Vicryl sutures to  reapproximate the subcutaneous tissue and Steri-Strips with benzoin to  reapproximate the patient's skin.  A sterile dressing was applied.  The  drapes were removed, and the patient was subsequently extubated by the  anesthesia team and transported to the postanesthesia care unit in  stable condition.  All sponge, instrument and needle counts were correct  at the end of this case.      Cristi Loron, M.D.  Electronically Signed     JDJ/MEDQ  D:  02/28/2007  T:  02/28/2007  Job:  [id number]

## 2011-03-13 NOTE — Consult Note (Signed)
NAMEJAKEVIOUS, HOLLISTER NO.:  [id number]   MEDICAL RECORD NO.:  [id number]          PATIENT TYPE:  EMS   LOCATION:  [facility]                         FACILITY:  [facility]   PHYSICIAN:  Cristi Loron, M.D.DATE OF BIRTH:  [birth year]   DATE OF CONSULTATION:  07/01/2005  DATE OF DISCHARGE:                                   CONSULTATION   CHIEF COMPLAINT:  Exposed catheter.   HISTORY OF PRESENT ILLNESS:  The patient is a 35 year old black male who is  well known to me via his shunt difficulties.  He has had a shunt placed at  near birth.  He has had multiple infections and revisions.  More recently he  had a shunt malfunction revision around July of 2006.  He had an infection  and had to have the shunt catheter removed.  He was treated with  antibiotics.  He had another antibiotic impregnated catheter placed on  September 6.  The patient was subsequently treated with antibiotics and  discharged.  He did not keep his follow up appointment and says he was doing  quite well until about a week ago, when he noticed some erythema and  drainage from his left chest along the tract of the catheter.  He comes in  tonight because of this concern.  He denies headaches, nausea, vomiting,  seizures.  He feels both my shunts are working fine.   For details of the patient's past medical history, past surgical history,  medications, drug allergies, family history, social history, etc,...  please  refer to the original consultation of his last consultation of May 02, 2005.   He is a generally, pleasant, healthy-appearing 35 year old black male in no  apparent distress.  HEENT:  The patient's scalp is quite sunken with his  prior cerebellar functions he has quite a bit of swelling at the cranium  site.  He has a large divot at the cranium site.  No signs of infection of  his scalp incision.  There is an opened area along the upper aspect of his  shunt catheter in approximately the mid  thoracic region.  The shunt catheter  is visible through this.  He has a second granulating area down lower in his  abdomen.   Neurologically, the patient's at his baseline.  He is alert and oriented,  moving all extremities well.   IMAGING STUDIES:  I obtained a chest x-ray which demonstrates that the  patient's new catheter, either one goes from his left occipital region into  his right upper quadrant of his abdomen looks fine.  There is no  disconnection.  The area in question is his old catheter, which is  disconnected from the new shunt/valve.   ASSESSMENT/PLAN:  Infected old shunt catheter.  I prepared the patient's  skin with Betadine solution and injected lidocaine in the area around the  exposed catheter.  I then grasped the shunt catheter with a hemostatin and  pulled it out as far as I could get it, approximately 2 cm and then the  catheter broke.   At this point, I recommended that we  treat the patient with antibiotics.  He  has no known drug allergies.  I have given him a prescription for Keflex 500  mg #56, 1 p.o. every 6 hours.  I have asked him to return to see me in the  office in 1 week.  If we can clear this infection with antibiotics, then  great.  If not, we may need to remove the entire shunt catheter.  I think  this would be somewhat difficult as it is quite scarred in.  It would  require quite a long incision.  I have answered all the patient's questions.  I will see him back in the office in a week.      Cristi Loron, M.D.  Electronically Signed     JDJ/MEDQ  D:  07/01/2006  T:  07/01/2006  Job:  [id number]

## 2011-06-30 ENCOUNTER — Ambulatory Visit: Payer: Medicare Other | Attending: Sports Medicine

## 2011-06-30 DIAGNOSIS — R269 Unspecified abnormalities of gait and mobility: Secondary | ICD-10-CM | POA: Insufficient documentation

## 2011-06-30 DIAGNOSIS — M6281 Muscle weakness (generalized): Secondary | ICD-10-CM | POA: Insufficient documentation

## 2011-06-30 DIAGNOSIS — IMO0001 Reserved for inherently not codable concepts without codable children: Secondary | ICD-10-CM | POA: Insufficient documentation

## 2011-06-30 DIAGNOSIS — M25569 Pain in unspecified knee: Secondary | ICD-10-CM | POA: Insufficient documentation

## 2011-07-06 ENCOUNTER — Ambulatory Visit: Payer: Medicare Other | Admitting: Physical Therapy

## 2011-07-08 ENCOUNTER — Ambulatory Visit: Payer: Medicare Other | Admitting: Physical Therapy

## 2011-07-14 ENCOUNTER — Ambulatory Visit: Payer: Medicare Other

## 2011-07-21 ENCOUNTER — Ambulatory Visit: Payer: Medicare Other | Admitting: Rehabilitation

## 2011-07-23 ENCOUNTER — Ambulatory Visit: Payer: Medicare Other | Admitting: Rehabilitation

## 2011-07-24 LAB — BASIC METABOLIC PANEL
BUN: 11
CO2: 34 — ABNORMAL HIGH
Calcium: 9.5
Chloride: 101
Creatinine, Ser: 1.11
GFR calc Af Amer: >60
GFR calc non Af Amer: >60
Glucose, Bld: 100 — ABNORMAL HIGH
Potassium: 3.8
Sodium: 139

## 2011-07-24 LAB — POCT HEMOGLOBIN-HEMACUE: Hemoglobin: 13.6

## 2011-07-29 ENCOUNTER — Encounter: Payer: Medicare Other | Admitting: Rehabilitation

## 2011-07-30 ENCOUNTER — Ambulatory Visit: Payer: Medicare Other | Attending: Sports Medicine

## 2011-07-30 DIAGNOSIS — M6281 Muscle weakness (generalized): Secondary | ICD-10-CM | POA: Insufficient documentation

## 2011-07-30 DIAGNOSIS — M25569 Pain in unspecified knee: Secondary | ICD-10-CM | POA: Insufficient documentation

## 2011-07-30 DIAGNOSIS — IMO0001 Reserved for inherently not codable concepts without codable children: Secondary | ICD-10-CM | POA: Insufficient documentation

## 2011-07-30 DIAGNOSIS — R269 Unspecified abnormalities of gait and mobility: Secondary | ICD-10-CM | POA: Insufficient documentation

## 2011-08-06 ENCOUNTER — Encounter: Payer: Medicare Other | Admitting: Rehabilitation

## 2011-08-06 LAB — BASIC METABOLIC PANEL
BUN: 17
CO2: 26
Calcium: 9.4
Creatinine, Ser: 1.01

## 2011-08-06 LAB — CBC
MCHC: 33
MCV: 87.3
Platelets: 352
RBC: 4.77
WBC: 5.1

## 2011-08-07 LAB — WOUND CULTURE

## 2011-08-07 LAB — GRAM STAIN

## 2011-08-07 LAB — CBC
Hemoglobin: 12.9 — ABNORMAL LOW
MCHC: 33.6
RBC: 4.41
WBC: 4.9

## 2011-08-07 LAB — BASIC METABOLIC PANEL
CO2: 29
Calcium: 9.5
Creatinine, Ser: 0.94
GFR calc Af Amer: >60
Sodium: 139

## 2011-08-07 LAB — ANAEROBIC CULTURE

## 2013-03-06 ENCOUNTER — Other Ambulatory Visit: Payer: Self-pay | Admitting: Neurology

## 2013-05-29 ENCOUNTER — Other Ambulatory Visit: Payer: Self-pay

## 2013-05-29 MED ORDER — DIVALPROEX SODIUM 250 MG PO DR TAB: 1000.0000 mg | DELAYED_RELEASE_TABLET | Freq: Two times a day (BID) | ORAL | Status: DC

## 2013-05-29 NOTE — Telephone Encounter (Signed)
500mg  currently not avail, pharm requesting 250mg  tabs instead.

## 2014-05-15 ENCOUNTER — Other Ambulatory Visit: Payer: Self-pay | Admitting: Neurology

## 2014-10-03 ENCOUNTER — Other Ambulatory Visit: Payer: Self-pay | Admitting: Neurology

## 2014-10-04 NOTE — Telephone Encounter (Signed)
Patient has not been seen in Epic.  

## 2016-02-18 ENCOUNTER — Telehealth: Payer: Self-pay | Admitting: Podiatry

## 2016-02-18 ENCOUNTER — Ambulatory Visit: Payer: Medicare Other | Admitting: Podiatry

## 2016-02-26 ENCOUNTER — Telehealth: Payer: Self-pay | Admitting: Neurology

## 2016-02-26 NOTE — Telephone Encounter (Signed)
Called pt to make appt. Not sure when he was last seen. If he calls please make appt. Thank you

## 2016-02-26 NOTE — Telephone Encounter (Signed)
appt schedule for 03/12/16

## 2016-03-03 ENCOUNTER — Ambulatory Visit: Payer: Medicare Other | Admitting: Podiatry

## 2016-03-04 ENCOUNTER — Encounter: Payer: Self-pay | Admitting: Podiatry

## 2016-03-04 ENCOUNTER — Ambulatory Visit (INDEPENDENT_AMBULATORY_CARE_PROVIDER_SITE_OTHER): Payer: Medicare Other | Admitting: Podiatry

## 2016-03-04 VITALS — BP 142/82 | HR 69 | Resp 12

## 2016-03-04 DIAGNOSIS — M21371 Foot drop, right foot: Secondary | ICD-10-CM

## 2016-03-04 DIAGNOSIS — Q828 Other specified congenital malformations of skin: Secondary | ICD-10-CM | POA: Diagnosis not present

## 2016-03-04 NOTE — Progress Notes (Signed)
   Subjective:    Patient ID: [name], male    DOB: Nov 15, 1975, 40 y.o.   MRN: [id number]  HPI   This patient presents today with sister present in the treatment room who is complaining of a painful plantar callus sub-first MPJ right present for approximately 5 months. Patient more recently has attempted callus remover, however, the lesion persisted and is quite uncomfortable weightbearing and walking    Review of Systems  Musculoskeletal: Positive for gait problem.  Skin: Positive for color change.       Objective:   Physical Exam  Patient is slow responding to questioning, however, appears to be orientated 3  Vascular: DP and PT pulses 1/4 bilaterally Capillary reflex immediate bilaterally  Neurological: Sensation to 10 g monofilament wire intact 4/5 bilaterally Vibratory sensation nonreactive right reactive left Ankle reflexes reactive weekly bilaterally  Dermatological: Hemorrhagic callus sub-first MPJ right that remains closed after debridement  Musculoskeletal: Manual motor testing: Dorsi flexion right 0/5, left 5/5 Plantar flexion right 0/5, left 5/5 Unsteady gait       Assessment & Plan:   Assessment: Gait disturbance Decrease pedal pulses bilaterally 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 I debrided the plantar keratoses in the right without any bleeding. I made aware this lesion would recur and return as needed for debridement

## 2016-03-04 NOTE — Patient Instructions (Signed)
Today I trimmed a corn-like growth off on the bottom of right foot which will recur and need occasional trimming. Also, I attached a felt pad to the insole of your shoe to take pressure off this callus area in the ball of the right foot Return as needed

## 2016-03-12 ENCOUNTER — Ambulatory Visit (INDEPENDENT_AMBULATORY_CARE_PROVIDER_SITE_OTHER): Payer: Medicare Other | Admitting: Neurology

## 2016-03-12 ENCOUNTER — Encounter: Payer: Self-pay | Admitting: Neurology

## 2016-03-12 VITALS — BP 126/78 | HR 106 | Ht 72.0 in | Wt 151.0 lb

## 2016-03-12 DIAGNOSIS — R269 Unspecified abnormalities of gait and mobility: Secondary | ICD-10-CM

## 2016-03-12 DIAGNOSIS — Z9889 Other specified postprocedural states: Secondary | ICD-10-CM

## 2016-03-12 DIAGNOSIS — G40209 Localization-related (focal) (partial) symptomatic epilepsy and epileptic syndromes with complex partial seizures, not intractable, without status epilepticus: Secondary | ICD-10-CM | POA: Diagnosis not present

## 2016-03-12 MED ORDER — DIVALPROEX SODIUM 500 MG PO DR TAB: 1000.0000 mg | DELAYED_RELEASE_TABLET | Freq: Two times a day (BID) | ORAL | Status: DC

## 2016-03-12 NOTE — Progress Notes (Signed)
PATIENT: [name] DOB: 01-May-1976  Chief Complaint  Patient presents with  . Seizures    He is here with his sister, [name].  He was last seen in March 2014.  Reports his last seizure occurred six years ago.  His PCP has been prescribing his anticonvulsants.  He is currently taking Depakote 1000. BID.     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, with residual right hemiparesis, complex partial seizure,  He began to experience seizures since teens, preceded by numbness involving right side of his body, loss of consciousness, tonic-clonic movement, last recurrent seizure was in 2011  He is now taking Depakote DR 500 mg 2 tablet twice a day, tolerating medication well,  Over the years, he had gradually developed worsening gait difficulty, rely on his cane now,  use wheelchair for long-distance travel, he lives with his mother and sister [name]  I have personally reviewed MRI of the brain without contrast in 2008:Postoperative changes of resection in the left occipital and parietal lobe.   REVIEW OF SYSTEMS: Full 14 system review of systems performed and notable only for as above  ALLERGIES: No Known Allergies  HOME MEDICATIONS: Current Outpatient Prescriptions  Medication Sig Dispense Refill  . divalproex (DEPAKOTE) 500 MG DR tablet TAKE 2 TABLETS (1,000 MG TOTAL) BY MOUTH 2 (TWO) TIMES DAILY. 60 tablet 0  . meloxicam (MOBIC) 15 MG tablet Take 15 mg by mouth daily.  1  . traMADol (ULTRAM) 50 MG tablet     . valsartan-hydrochlorothiazide (DIOVAN-HCT) 80-12.5 MG tablet Take 1 tablet by mouth daily.  1   No current facility-administered medications for this visit.     PAST MEDICAL HISTORY: Past Medical History  Diagnosis Date  . Seizures ([place])   . Brain tumor (benign) ([place])   . Hypertension   . Mental retardation, mild (I.Q. 50-70)     PAST SURGICAL HISTORY: Past Surgical History  Procedure Laterality Date  . Brain surgery      FAMILY HISTORY: Family History  Problem Relation Age of Onset  . Prostate cancer Father   . Diabetes Mother   . Hypertension Mother   . Atrial fibrillation Mother   . Multiple sclerosis Sister     Deceased  . Multiple sclerosis Sister     Deceased  . Hypertension Sister     SOCIAL HISTORY:  Social History   Social History  . Marital Status: Single    Spouse Name: N/A  . Number of Children: 2  . Years of Education: HS   Occupational History  . Disabled    Social History Main Topics  . Smoking status: Never Smoker   . Smokeless tobacco: Not on file  . Alcohol Use: No  . Drug Use: No  . Sexual Activity: Not on file   Other Topics Concern  . Not on file   Social History Narrative   Lives at home with his mother and his sister.   Left-handed.   No caffeine use.     PHYSICAL EXAM   Filed Vitals:   03/12/16 0949  BP: 126/78  Pulse: 106  Height: 6' (1.829 m)  Weight: 151 lb (68.493 kg)  Not recorded      Body mass index is 20.47 kg/(m^2).  PHYSICAL EXAMNIATION:  Gen: NAD, conversant, well nourised, obese, well groomed                     Cardiovascular: Regular rate rhythm, no peripheral edema, warm, nontender. Eyes: Conjunctivae clear without exudates or hemorrhage Neck: Supple, no carotid bruise. Pulmonary: Clear to auscultation bilaterally   NEUROLOGICAL EXAM:  MENTAL STATUS: Speech:  Mild slurred speech.  Cognition:     Orientation to time, place and person     Normal recent and remote memory     Normal Attention span and concentration     Normal Language, naming, repeating,spontaneous speech     Fund of knowledge   CRANIAL NERVES: CN II: Right pupil dilated,  not reactive to light, right visual field deficit CN III, IV, VI: extraocular movement are normal. No ptosis. CN V: Facial sensation is intact to pinprick in all 3 divisions bilaterally. Corneal responses are intact.  CN VII: Right lower face weakness CN VIII: Hearing is normal to rubbing fingers CN IX, X: Palate elevates symmetrically. Phonation is normal. CN XI: Head turning and shoulder shrug are intact CN XII: Tongue is midline with normal movements and no atrophy.  MOTOR: Spastic right hemiparesis, right upper/lower extremity proximal and distal muscle strength 4/5  REFLEXES: Reflexes are 2+ and symmetric at the biceps, triceps, knees, and ankles. Plantar responses are flexor.  SENSORY: Intact to light touch, pinprick, positional sensation and vibratory sensation are intact in fingers and toes.  COORDINATION: There is no dysmetria on finger-to-nose and heel-knee-shin.    GAIT/STANCE:  spastic right hemi-circumferential gait,   DIAGNOSTIC DATA (LABS, IMAGING, TESTING) - I reviewed patient records, labs, notes, testing and imaging myself where available.   ASSESSMENT AND PLAN  [name] is a 40 y.o. male    History of left craniotomy Complex partial seizure with secondary generalization  Refill current Depakote DR 500 mg 2 tablets twice a day  Laboratory evaluations Progressive worsening gait difficulty right hemiparesis  Refer him to physical therapy   [name], M.D. Ph.D.  [facility] [address], [place], [place] [address] Ph: [phone] Fax: [phone]  CC: Scott Bickers, MD

## 2016-03-13 LAB — COMPREHENSIVE METABOLIC PANEL
ALK PHOS: 52 IU/L (ref 39–117)
ALT: 6 IU/L (ref 0–44)
AST: 14 IU/L (ref 0–40)
Albumin/Globulin Ratio: 1.5 (ref 1.2–2.2)
Albumin: 4.5 g/dL (ref 3.5–5.5)
BUN/Creatinine Ratio: 10 (ref 9–20)
BUN: 8 mg/dL (ref 6–20)
Bilirubin Total: 0.3 mg/dL (ref 0.0–1.2)
CALCIUM: 9.6 mg/dL (ref 8.7–10.2)
CO2: 29 mmol/L (ref 18–29)
CREATININE: 0.79 mg/dL (ref 0.76–1.27)
Chloride: 96 mmol/L (ref 96–106)
GFR calc Af Amer: 132 mL/min/{1.73_m2} (ref >59)
GFR, EST NON AFRICAN AMERICAN: 114 mL/min/{1.73_m2} (ref >59)
GLUCOSE: 103 mg/dL — AB (ref 65–99)
Globulin, Total: 3.1 g/dL (ref 1.5–4.5)
Potassium: 3.4 mmol/L — ABNORMAL LOW (ref 3.5–5.2)
Sodium: 140 mmol/L (ref 134–144)
Total Protein: 7.6 g/dL (ref 6.0–8.5)

## 2016-03-13 LAB — CBC
Hematocrit: 38.9 % (ref 37.5–51.0)
Hemoglobin: 12.7 g/dL (ref 12.6–17.7)
MCH: 29.7 pg (ref 26.6–33.0)
MCHC: 32.6 g/dL (ref 31.5–35.7)
MCV: 91 fL (ref 79–97)
PLATELETS: 188 10*3/uL (ref 150–379)
RBC: 4.28 x10E6/uL (ref 4.14–5.80)
RDW: 14.8 % (ref 12.3–15.4)
WBC: 5 10*3/uL (ref 3.4–10.8)

## 2016-03-13 LAB — VALPROIC ACID LEVEL: VALPROIC ACID LVL: 82 ug/mL (ref 50–100)

## 2016-03-13 NOTE — Telephone Encounter (Signed)
Please call patient, normal VPA level of 82, no significant abnormality on rest lab evaluations

## 2016-03-13 NOTE — Telephone Encounter (Signed)
Patient's sister answered phone; pt was not home. Informed her this office would call back next week.

## 2016-03-13 NOTE — Telephone Encounter (Addendum)
normal VPA level of 82, no significant abnormality on the rest lab evaluations

## 2016-03-16 NOTE — Telephone Encounter (Signed)
Spoke to patient he is aware of results.

## 2016-04-03 ENCOUNTER — Ambulatory Visit: Payer: Medicare Other | Attending: Neurology | Admitting: Rehabilitation

## 2016-04-03 ENCOUNTER — Telehealth: Payer: Self-pay | Admitting: Rehabilitation

## 2016-04-03 ENCOUNTER — Encounter: Payer: Self-pay | Admitting: Rehabilitation

## 2016-04-03 DIAGNOSIS — M6281 Muscle weakness (generalized): Secondary | ICD-10-CM | POA: Insufficient documentation

## 2016-04-03 DIAGNOSIS — R29898 Other symptoms and signs involving the musculoskeletal system: Secondary | ICD-10-CM | POA: Diagnosis present

## 2016-04-03 DIAGNOSIS — R2689 Other abnormalities of gait and mobility: Secondary | ICD-10-CM

## 2016-04-03 DIAGNOSIS — R2681 Unsteadiness on feet: Secondary | ICD-10-CM | POA: Insufficient documentation

## 2016-04-03 DIAGNOSIS — I69953 Hemiplegia and hemiparesis following unspecified cerebrovascular disease affecting right non-dominant side: Secondary | ICD-10-CM

## 2016-04-03 NOTE — Telephone Encounter (Signed)
I have ordered right AFO.

## 2016-04-03 NOTE — Telephone Encounter (Signed)
Dr. Krista Blue,  [name] has been utilizing a R AFO brace during PT session to address R foot drop. When wearing R AFO, patient demonstrates less frequent losses of balance while ambulating. The patient would benefit from right AFO brace to decrease fall risk.  If you agree, please submit an order for a R AFO.  Thank you, Cameron Sprang, PT, MPT [facility] [address] [place], [place], [address] Phone: [phone]   Fax:  [phone] 04/03/2016, 3:41 PM

## 2016-04-03 NOTE — Therapy (Signed)
[facility] [address] [place], [place], [address] Phone: [phone]   Fax:  [phone]  Physical Therapy Evaluation  Patient Details  Name: [name] MRN: [id number] Date of Birth: 12/31/1975 Referring Provider: Marcial Pacas, MD  Encounter Date: 04/03/2016      PT End of Session - 04/03/16 1458    Visit Number 1   Number of Visits 9   Date for PT Re-Evaluation 05/03/16   PT Start Time 1108  pt late for appt   PT Stop Time 1150   PT Time Calculation (min) 42 min   Activity Tolerance Patient tolerated treatment well   Behavior During Therapy [facility] for tasks assessed/performed      Past Medical History  Diagnosis Date  . Seizures ([place])   . Brain tumor (benign) ([place])   . Hypertension   . Mental retardation, mild (I.Q. 50-70)     Past Surgical History  Procedure Laterality Date  . Brain surgery      There were no vitals filed for this visit.       Subjective Assessment - 04/03/16 1115    Subjective "I had knee surgery on my R knee cap in 2014 and in 2016 it started bothering me really bad.  they took xrays and found that it was messed up again."    Patient is accompained by: Family member  Nadine   Limitations Walking;Standing   Patient Stated Goals "I want to improve my walking ability"   Currently in Pain? Yes   Pain Score 8    Pain Location Knee   Pain Orientation Right   Pain Descriptors / Indicators Aching   Pain Type Chronic pain   Pain Onset More than a month ago   Pain Frequency Constant   Aggravating Factors  walking at home (it stiffens)   Pain Relieving Factors stay off of it            [facility] PT Assessment - 04/03/16 0001    Assessment   Medical Diagnosis Gait, balance   Referring Provider Marcial Pacas, MD   Onset Date/Surgical Date --  gait and balance getting worse over the last year   Precautions   Precautions Fall   Precaution Comments hx of brain tumor s/p crani, R  hemiparesis   Balance Screen   Has the patient fallen in the past 6 months Yes   How many times? 2   Has the patient had a decrease in activity level because of a fear of falling?  Yes   Is the patient reluctant to leave their home because of a fear of falling?  Yes   [place] residence   Living Arrangements Parent   Available Help at Discharge Family;Available 24 hours/day   Type of Home House   Home Access Stairs to enter   Entrance Stairs-Number of Steps 3   Entrance Stairs-Rails None  uses cane and sister helps   Home Layout Two level;Able to live on main level with bedroom/bathroom   [place] - quad;Shower seat   Prior Function   Level of Independence Requires assistive device for independence  does not cook, clean   Vocation Unemployed   Leisure Used to like to play football and basketball    Cognition   Overall Cognitive Status History of cognitive impairments - at baseline   Sensation   Light Touch Appears Intact   Hot/Cold Appears Intact   Coordination   Gross  Motor Movements are Fluid and Coordinated No   Fine Motor Movements are Fluid and Coordinated No   Coordination and Movement Description coordination is decreased due to decreased strength in RLE   ROM / Strength   AROM / PROM / Strength Strength   Strength   Overall Strength Deficits   Overall Strength Comments R LE: hip flex 2+/5, knee ext 3+/5, knee flex 2/5, ankle DF 1/5, ankle PF 0/5; LLE WFL   Palpation   Patella mobility somewhat limited and painful above and below patella   Transfers   Transfers Sit to Stand;Stand to Sit   Sit to Stand 5: Supervision   Sit to Stand Details Verbal cues for sequencing;Verbal cues for technique;Verbal cues for precautions/safety   Stand to Sit 5: Supervision   Stand to Sit Details (indicate cue type and reason) Verbal cues for sequencing;Verbal cues for technique;Verbal cues for precautions/safety   Comments Cues for  pushing from arm rests instead of using cane.    Ambulation/Gait   Ambulation/Gait Yes   Ambulation/Gait Assistance 5: Supervision;4: Min guard;4: Min assist   Ambulation Distance (Feet) 65 Feet  x 2 reps   Assistive device Small based quad cane   Gait Pattern Step-to pattern;Decreased arm swing - right;Decreased stride length;Decreased hip/knee flexion - right;Decreased dorsiflexion - right;Right genu recurvatum;Lateral hip instability;Trunk rotated posteriorly on right;Poor foot clearance - right   Ambulation Surface Level;Indoor   Gait velocity <1.31 indicative of increased fall risk   Gait Comments Trialed use of R ottobock reaction AFO with heel wedge in order to improve R DF assist and to decrease R knee recurvatum during stance phase of gait.  Note improvement with foot clearance however pt continues to go into recurvatum, however feel that it may be due to brace being too short for pts long lever arm (long legs).  Will continue to address during sessions.                            PT Education - 04/03/16 1458    Education provided Yes   Education Details Education on POC, goals, and need for AFO assessment   Person(s) Educated Patient;Other (comment)  sister   Methods Explanation;Demonstration   Comprehension Verbalized understanding             PT Long Term Goals - 04/03/16 1517    PT LONG TERM GOAL #1   Title Pt will be independent with HEP (with handout) in order to indicate improved functional mobility.  (Target Date: 04/30/16)   Baseline dependent with HEP   Status New   PT LONG TERM GOAL #2   Title Pt will improve gait speed to >1.00 ft/sec in order to indicate decreased fall risk and improved efficiency of gait.     Baseline <1.31 ft/sec on 04/03/16   Status New   PT LONG TERM GOAL #3   Title Pt will get AFO assessment and issued appropriate brace to improve gait efficiency and safety.     Baseline has no AFO at this time   PT LONG TERM GOAL #4    Title Pt will ambulate >200' w/ LRAD and AFO at mod I level over indoor and paved outdoor surfaces in order to indicate safe home and limited community mobility.     Baseline min A   PT LONG TERM GOAL #5   Title Will assess BERG and improve score by 4 points in order to indicate decreased fall risk.  Baseline Have not assessed at this time               Plan - 04/03/16 1500    Clinical Impression Statement Pt presents with increasing pain in R knee and decreased strength in RLE causing decreased balance and recurrent falls at home.  Pt states that this has been gradually getting worse since last year.  Upon PT evaluation, note that pt with gait speed <1.31 ft/sec indicative of recurrent fall risk, R foot drop, R knee recurvatum due to weakness causing impaired balance and safety.  Note that he currently uses quad cane but is not facing correct direction (unable to correct due to peg broken) and has not had any formal therapy.  He has history of brain tumor causing R hemi paresis and history of sx on R knee cap, both of which are impacting pts strength, balance and endurance.  Pt is of stable presentation as injury was more than 30 years ago (craniotomy).   He is of moderate complexity with PT POC.  Feel that pt will greatly benefit from skilled OP neuro PT in order to address deficits and have brace assessment to improve R DF assist and control at R knee to decrease pain.     Rehab Potential Good   Clinical Impairments Affecting Rehab Potential chronicity of condition   PT Frequency 2x / week   PT Duration 4 weeks   PT Treatment/Interventions ADLs/Self Care Home Management;Electrical Stimulation;DME Instruction;Gait training;Stair training;Functional mobility training;Therapeutic activities;Therapeutic exercise;Balance training;Neuromuscular re-education;Patient/family education;Orthotic Fit/Training;Manual techniques;Energy conservation   PT Next Visit Plan check to see if order for AFO  in Epic, gait training with varying devices, balance, est HEP for strength and balance, BERG   Recommended Other Services PT to send request for AFO order 04/03/16   Consulted and Agree with Plan of Care Patient;Family member/caregiver   Family Member Consulted Sister Nadine      Patient will benefit from skilled therapeutic intervention in order to improve the following deficits and impairments:  Abnormal gait, Decreased activity tolerance, Decreased balance, Decreased endurance, Decreased knowledge of use of DME, Decreased mobility, Decreased safety awareness, Decreased strength, Difficulty walking, Hypermobility, Impaired perceived functional ability, Impaired UE functional use, Improper body mechanics, Postural dysfunction  Visit Diagnosis: Unsteadiness on feet - Plan: PT plan of care cert/re-cert  Other abnormalities of gait and mobility - Plan: PT plan of care cert/re-cert  Muscle weakness (generalized) - Plan: PT plan of care cert/re-cert  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 Abnormality of gait 03/12/2016  . MENTAL RETARDATION, MILD 09/05/2007  . 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 HYPERTENSION 09/05/2007  . 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 HEMORRHAGE, INTRACEREBRAL 06/28/2007  . Complex partial seizure ([place])  06/28/2007  . H/O craniotomy 06/28/2007  . INFECTION DUE TO NERVOUS SYSTEM DEVICE/GRAFT 06/22/2007  Cameron Sprang, PT, MPT [facility] [address] [place], [place], [address] Phone: [phone]   Fax:  [phone] 04/03/2016, 3:38 PM  Name: JARAMIE BUHL MRN: [id number] Date of Birth: 1975-12-25

## 2016-04-09 ENCOUNTER — Ambulatory Visit: Payer: Medicare Other | Admitting: Rehabilitation

## 2016-04-09 ENCOUNTER — Ambulatory Visit: Payer: Medicare Other | Admitting: Physical Therapy

## 2016-04-09 DIAGNOSIS — R2689 Other abnormalities of gait and mobility: Secondary | ICD-10-CM

## 2016-04-09 DIAGNOSIS — R2681 Unsteadiness on feet: Secondary | ICD-10-CM | POA: Diagnosis not present

## 2016-04-09 DIAGNOSIS — R29898 Other symptoms and signs involving the musculoskeletal system: Secondary | ICD-10-CM

## 2016-04-09 NOTE — Therapy (Signed)
[facility] [address] [place], [place], [address] Phone: [phone]   Fax:  [phone]  Physical Therapy Treatment  Patient Details  Name: [name] MRN: [id number] Date of Birth: [birth year] Referring Provider: Marcial Pacas, MD  Encounter Date: 04/09/2016      PT End of Session - 04/09/16 1503    Visit Number 2   Number of Visits 9   Date for PT Re-Evaluation 05/03/16   PT Start Time [id number]   PT Stop Time 1450   PT Time Calculation (min) 43 min   Equipment Utilized During Treatment Gait belt   Activity Tolerance Patient tolerated treatment well   Behavior During Therapy [facility] for tasks assessed/performed      Past Medical History  Diagnosis Date  . Seizures ([place])   . Brain tumor (benign) ([place])   . Hypertension   . Mental retardation, mild (I.Q. 50-70)     Past Surgical History  Procedure Laterality Date  . Brain surgery      There were no vitals filed for this visit.      Subjective Assessment - 04/09/16 1415    Subjective Pt reports fall on Tuesday coming out of bathroom and R knee gave way   Limitations Walking;Standing   Patient Stated Goals "I want to improve my walking ability"   Currently in Pain? Yes   Pain Score 8    Pain Location Knee   Pain Orientation Right   Pain Descriptors / Indicators Aching   Pain Type Chronic pain   Pain Onset More than a month ago   Pain Frequency Constant   Aggravating Factors  walking   Pain Relieving Factors rest               [facility] Adult PT Treatment/Exercise - 04/09/16 0001    Transfers   Transfers Sit to Stand;Stand to Sit   Sit to Stand 5: Supervision   Sit to Stand Details Verbal cues for sequencing;Verbal cues for technique;Verbal cues for precautions/safety   Stand to Sit 5: Supervision   Stand to Sit Details (indicate cue type and reason) Verbal cues for sequencing;Verbal cues for technique;Verbal cues for precautions/safety   Ambulation/Gait   Ambulation/Gait Yes   Ambulation/Gait Assistance 4: Min guard;4: Min assist   Ambulation/Gait Assistance Details needing assist numerous times to maintain balance due to R foot catching with ambulation and LOB forward   Ambulation Distance (Feet) 100 Feet  twice and 60' x 3   Assistive device Small based quad cane;Large base quad cane   Gait Pattern Step-to pattern;Decreased arm swing - right;Decreased stride length;Decreased hip/knee flexion - right;Decreased dorsiflexion - right;Right genu recurvatum;Lateral hip instability;Trunk rotated posteriorly on right;Poor foot clearance - right   Ambulation Surface Level;Indoor   Gait Comments Trialed R toe off, R Blue Rocker and R ottoback reaction AFO's during session.  Pt with greatest improvement in foot clearance today with Blue Rocker.  Also less R genurecurvatum during stance with Blue Rocker.  Pt continues to need cues for safety and technique as well as increasing step size and clearance on the R.  Trialed [facility] as well with increased stability but pt reports he has RW at home.  Asked pt to bring RW at next visit to assess for most appropriate device.                PT Education - 04/09/16 1459    Education provided Yes   Education Details AFO assessment at next appointment  by Orthotist;bringing RW on next visit   Person(s) Educated Patient   Methods Explanation   Comprehension Verbalized understanding             PT Long Term Goals - 04/03/16 1517    PT LONG TERM GOAL #1   Title Pt will be independent with HEP (with handout) in order to indicate improved functional mobility.  (Target Date: 04/30/16)   Baseline dependent with HEP   Status New   PT LONG TERM GOAL #2   Title Pt will improve gait speed to >1.00 ft/sec in order to indicate decreased fall risk and improved efficiency of gait.     Baseline <1.31 ft/sec on 04/03/16   Status New   PT LONG TERM GOAL #3   Title Pt will get AFO assessment and issued  appropriate brace to improve gait efficiency and safety.     Baseline has no AFO at this time   PT LONG TERM GOAL #4   Title Pt will ambulate >200' w/ LRAD and AFO at mod I level over indoor and paved outdoor surfaces in order to indicate safe home and limited community mobility.     Baseline min A   PT LONG TERM GOAL #5   Title Will assess BERG and improve score by 4 points in order to indicate decreased fall risk.     Baseline Have not assessed at this time               Plan - 04/09/16 1500    Clinical Impression Statement Pt reports having rolling walker at home that he uses normally but has been bringing quad cane to therapy.  Pt encouraged to bring RW on next visit.  Improved R foot clearance with Blue Rocker today with cues for technique.  Pt appears motivated to increase mobility and strength.  Continue PT per POC.     Rehab Potential Good   Clinical Impairments Affecting Rehab Potential chronicity of condition   PT Frequency 2x / week   PT Duration 4 weeks   PT Treatment/Interventions ADLs/Self Care Home Management;Electrical Stimulation;DME Instruction;Gait training;Stair training;Functional mobility training;Therapeutic activities;Therapeutic exercise;Balance training;Neuromuscular re-education;Patient/family education;Orthotic Fit/Training;Manual techniques;Energy conservation   PT Next Visit Plan AFO assessment with Orthotist;check to see if order for AFO in Epic, gait training with patients RW, est HEP for strength and balance, BERG   Consulted and Agree with Plan of Care Patient;Family member/caregiver   Family Member Consulted Sister Nadine      Patient will benefit from skilled therapeutic intervention in order to improve the following deficits and impairments:  Abnormal gait, Decreased activity tolerance, Decreased balance, Decreased endurance, Decreased knowledge of use of DME, Decreased mobility, Decreased safety awareness, Decreased strength, Difficulty walking,  Hypermobility, Impaired perceived functional ability, Impaired UE functional use, Improper body mechanics, Postural dysfunction  Visit Diagnosis: Right leg weakness  Unsteadiness on feet  Other abnormalities of gait and mobility     Problem List Patient Active Problem List   Diagnosis Date Noted  . Right leg weakness 04/03/2016  . Abnormality of gait 03/12/2016  . MENTAL RETARDATION, MILD 09/05/2007  . 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 HYPERTENSION 09/05/2007  . 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 HEMORRHAGE, INTRACEREBRAL 06/28/2007  . Complex partial seizure ([place]) 06/28/2007  . H/O craniotomy 06/28/2007  . INFECTION DUE TO NERVOUS SYSTEM DEVICE/GRAFT 06/22/2007    Narda Bonds 04/09/2016, 3:08 PM  [facility] [address] [place], [place],  [id number] Phone: [phone]   Fax:  [phone]  Name: [name] MRN: [id number] Date of Birth: 09/08/1976    Narda Bonds, [place] 04/09/2016 3:08 PM Phone: [phone] Fax: [phone]

## 2016-04-10 ENCOUNTER — Ambulatory Visit: Payer: Medicare Other | Admitting: Rehabilitation

## 2016-04-10 ENCOUNTER — Encounter: Payer: Self-pay | Admitting: Rehabilitation

## 2016-04-10 DIAGNOSIS — R2689 Other abnormalities of gait and mobility: Secondary | ICD-10-CM

## 2016-04-10 DIAGNOSIS — R29898 Other symptoms and signs involving the musculoskeletal system: Secondary | ICD-10-CM

## 2016-04-10 DIAGNOSIS — R2681 Unsteadiness on feet: Secondary | ICD-10-CM | POA: Diagnosis not present

## 2016-04-10 NOTE — Therapy (Signed)
[facility] [address] [place], [place], [address] Phone: [phone]   Fax:  [phone]  Physical Therapy Treatment  Patient Details  Name: [name] MRN: [id number] Date of Birth: June 23, 1976 Referring Provider: Marcial Pacas, MD  Encounter Date: 04/10/2016      PT End of Session - 04/10/16 1013    Visit Number 3   Number of Visits 9   Date for PT Re-Evaluation 05/03/16   PT Start Time [id number]   PT Stop Time 1100   PT Time Calculation (min) 45 min   Equipment Utilized During Treatment Gait belt   Activity Tolerance Patient tolerated treatment well   Behavior During Therapy [facility] for tasks assessed/performed      Past Medical History  Diagnosis Date  . Seizures ([place])   . Brain tumor (benign) ([place])   . Hypertension   . Mental retardation, mild (I.Q. 50-70)     Past Surgical History  Procedure Laterality Date  . Brain surgery      There were no vitals filed for this visit.      Subjective Assessment - 04/10/16 1021    Subjective Reports no changes since yesterday no falls.  Ambulates into clinic with RW today, note marked improvement in balance and recommend he use this at all times.     Patient is accompained by: Family member   Limitations Walking;Standing   Patient Stated Goals "I want to improve my walking ability"   Currently in Pain? Yes   Pain Score 5    Pain Location Knee   Pain Orientation Right   Pain Descriptors / Indicators Aching   Pain Type Chronic pain   Pain Onset More than a month ago   Pain Frequency Constant   Aggravating Factors  walking   Pain Relieving Factors rest               Orthotic Fit Training:  Chris from [place] present during session to address AFO assessment.  Note that PT had donned blue rocker AFO without heel wedge to better assess success reported yesterday.  Continued to note recurvatum and due to poor shoes (soft heel and no arch support) therefore  added heel wedge (large).  Pt with only mild improvement in knee recurvatum during gait trials.  After discussion with Gerald Stabs, felt that custom may be most appropriate, however was unsure that it would fit appropriately in shoe.  Trialed custom brace from clinic with good fit and marked improvement in knee control with less recurvatum.  Pt to utilize this brace in therapy and further look at quad control to determine if joints can be added at ankle.  Both pt and sister verbalized understanding and are to look for more appropriate shoes.                    PT Education - 04/10/16 1022    Education provided Yes   Education Details Gerald Stabs from [place] present to educate on appropriate bracing to improve gait and balance.  Education on using RW at home at all times vs cane for increased safety.    Person(s) Educated Patient;Other (comment)  sister   Methods Explanation   Comprehension Verbalized understanding             PT Long Term Goals - 04/03/16 1517    PT LONG TERM GOAL #1   Title Pt will be independent with HEP (with handout) in order to indicate improved functional mobility.  (Target  Date: 04/30/16)   Baseline dependent with HEP   Status New   PT LONG TERM GOAL #2   Title Pt will improve gait speed to >1.00 ft/sec in order to indicate decreased fall risk and improved efficiency of gait.     Baseline <1.31 ft/sec on 04/03/16   Status New   PT LONG TERM GOAL #3   Title Pt will get AFO assessment and issued appropriate brace to improve gait efficiency and safety.     Baseline has no AFO at this time   PT LONG TERM GOAL #4   Title Pt will ambulate >200' w/ LRAD and AFO at mod I level over indoor and paved outdoor surfaces in order to indicate safe home and limited community mobility.     Baseline min A   PT LONG TERM GOAL #5   Title Will assess BERG and improve score by 4 points in order to indicate decreased fall risk.     Baseline Have not assessed at this time                Plan - 04/10/16 1013    Clinical Impression Statement Gerald Stabs from [place] present during session for formal AFO assessment.  Note that pt did very well with custom plastic AFO with great control at knee.  Educated on need for different shoe if possible in order to prevent soft heel (increased recurvatum) and over pronation.  Both pt and sister verbalized understanding.  Also educated on need to use RW instead of cane to increase safety at home.    Rehab Potential Good   Clinical Impairments Affecting Rehab Potential chronicity of condition   PT Frequency 2x / week   PT Duration 4 weeks   PT Treatment/Interventions ADLs/Self Care Home Management;Electrical Stimulation;DME Instruction;Gait training;Stair training;Functional mobility training;Therapeutic activities;Therapeutic exercise;Balance training;Neuromuscular re-education;Patient/family education;Orthotic Fit/Training;Manual techniques;Energy conservation   PT Next Visit Plan AFO assessment with Orthotist;check to see if order for AFO in Epic, gait training with patients RW, est HEP for strength and balance, BERG   Consulted and Agree with Plan of Care Patient;Family member/caregiver   Family Member Consulted Sister Nadine      Patient will benefit from skilled therapeutic intervention in order to improve the following deficits and impairments:  Abnormal gait, Decreased activity tolerance, Decreased balance, Decreased endurance, Decreased knowledge of use of DME, Decreased mobility, Decreased safety awareness, Decreased strength, Difficulty walking, Hypermobility, Impaired perceived functional ability, Impaired UE functional use, Improper body mechanics, Postural dysfunction  Visit Diagnosis: Right leg weakness  Unsteadiness on feet  Other abnormalities of gait and mobility     Problem List Patient Active Problem List   Diagnosis Date Noted  . Right leg weakness 04/03/2016  . Abnormality of gait 03/12/2016  .  MENTAL RETARDATION, MILD 09/05/2007  . 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 HYPERTENSION 09/05/2007  . 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 HEMORRHAGE, INTRACEREBRAL 06/28/2007  . Complex partial seizure ([place]) 06/28/2007  . H/O craniotomy 06/28/2007  . INFECTION DUE TO NERVOUS SYSTEM DEVICE/GRAFT 06/22/2007    Cameron Sprang, PT, MPT [facility] [address] [place], [place], [address] Phone: [phone]   Fax:  [phone] 04/10/2016, 12:47 PM  Name: DRAYTON MATLOCK MRN: [id number] Date of Birth: [birth year]

## 2016-04-16 ENCOUNTER — Ambulatory Visit: Payer: Medicare Other | Admitting: Rehabilitation

## 2016-04-16 ENCOUNTER — Encounter: Payer: Self-pay | Admitting: Rehabilitation

## 2016-04-16 DIAGNOSIS — R2681 Unsteadiness on feet: Secondary | ICD-10-CM | POA: Diagnosis not present

## 2016-04-16 DIAGNOSIS — R29898 Other symptoms and signs involving the musculoskeletal system: Secondary | ICD-10-CM

## 2016-04-16 DIAGNOSIS — R2689 Other abnormalities of gait and mobility: Secondary | ICD-10-CM

## 2016-04-16 NOTE — Patient Instructions (Signed)
Functional Quadriceps: Sit to Stand    Pull a chair up to the sink so that the middle of your body lines up with the middle part of the sink.  Sit on edge of chair, feet flat on floor, make sure that your feet are side by side (do not let your Left foot be behind the Right). Stand upright, extending knees fully.  Make sure that your weight is equal.  Use the middle of the sink and ask yourself "Am I in the middle?"  Adjust yourself so that you are putting weight on both legs evenly.   Repeat __10__ times per set. Do __1__ sets per session. Do __2__ sessions per day.  http://orth.exer.us/734   Knee Extension: Quadriceps Double Leg Minisquat (Eccentric)    Still perform this in front of the sink and have the chair behind you for safety.  Standing, slowly bend knees for 3-5 seconds.  Make sure that your weight is on both legs (does your body line up with the middle of the sink). Then extend knees quickly. Bear more weight on affected knee when bending as tolerated.  Repeat 10 times.  Do 2 times per day.   ___ reps per set, ___ sets per day, ___ days per week.  http://ecce.exer.us/128   I want you to wear the brace for 2 hours on and 30 min to 1 hour off.  Check your skin and make sure that are no red areas or areas of broken skin.  Wear it to therapy the next time you come.    Copyright  VHI. All rights reserved.    Copyright  VHI. All rights reserved.

## 2016-04-16 NOTE — Therapy (Signed)
[facility] [address] [place], [place], [address] Phone: [phone]   Fax:  [phone]  Physical Therapy Treatment  Patient Details  Name: [name] MRN: [id number] Date of Birth: 1975-11-24 Referring Provider: Marcial Pacas, MD  Encounter Date: 04/16/2016      PT End of Session - 04/16/16 1111    Visit Number 4   Number of Visits 9   Date for PT Re-Evaluation 05/03/16   PT Start Time 0930   PT Stop Time 1016   PT Time Calculation (min) 46 min   Equipment Utilized During Treatment Gait belt   Activity Tolerance Patient tolerated treatment well   Behavior During Therapy [facility] for tasks assessed/performed      Past Medical History  Diagnosis Date  . Seizures ([place])   . Brain tumor (benign) ([place])   . Hypertension   . Mental retardation, mild (I.Q. 50-70)     Past Surgical History  Procedure Laterality Date  . Brain surgery      There were no vitals filed for this visit.      Subjective Assessment - 04/16/16 0937    Subjective I"m taking my time like you said so I haven't fallen any at home.    Limitations Walking;Standing   Patient Stated Goals "I want to improve my walking ability"   Currently in Pain? Yes   Pain Score 6    Pain Location Knee   Pain Orientation Right   Pain Descriptors / Indicators Aching;Dull   Pain Type Chronic pain   Pain Frequency Intermittent               Self Care:  Provided pt with education on wear schedule for new AFO.  Also made instructions on HEP so that sister could be aware.  She was not present during session nor in lobby for PT to speak with regarding this matter.    NMR:  Remainder of session focused on sit<>stand with equal WB, midline orientation and mini squats to address quad strength.  Assisted with donning brace (in different shoes from previous session, therefore had to place brace in shoe then foot in both, educated that sister would likely  need to assist with this).  Performed x 10-15 reps during session, however noted that pt still with increased difficulty with equal WB, therefore had pt push with B UEs on RLE and also added use of mirror for increased visual feedback.  Provided heavy cues and facilitation for increased forward weight shift from hips/trunk and max cues to keep LLE planted during transition as he tends to slide L foot underneath him to increase LLE use.  Once in standing, focused on equal WB with verbal and visual cues for increased weight shift to the R.  Pt able to state increased weight on RLE, however unable to carryover from each rep without cues.  Then transitioned to mini squats while in standing, again focusing on equal WB.  Note descent quad control during exercise with no buckling, but still tends to increase L lateral lean during reps.  Performed x 10 reps and provided for HEP, see pt instruction.                    PT Education - 04/16/16 1110    Education provided Yes   Education Details Educated on brace wear schedule, skin checks, and HEP, see pt instruction for details.  Did not get to talk to sister today therefore would do  so at next visit.     Person(s) Educated Patient   Methods Explanation   Comprehension Verbalized understanding             PT Long Term Goals - 04/03/16 1517    PT LONG TERM GOAL #1   Title Pt will be independent with HEP (with handout) in order to indicate improved functional mobility.  (Target Date: 04/30/16)   Baseline dependent with HEP   Status New   PT LONG TERM GOAL #2   Title Pt will improve gait speed to >1.00 ft/sec in order to indicate decreased fall risk and improved efficiency of gait.     Baseline <1.31 ft/sec on 04/03/16   Status New   PT LONG TERM GOAL #3   Title Pt will get AFO assessment and issued appropriate brace to improve gait efficiency and safety.     Baseline has no AFO at this time   PT LONG TERM GOAL #4   Title Pt will ambulate  >200' w/ LRAD and AFO at mod I level over indoor and paved outdoor surfaces in order to indicate safe home and limited community mobility.     Baseline min A   PT LONG TERM GOAL #5   Title Will assess BERG and improve score by 4 points in order to indicate decreased fall risk.     Baseline Have not assessed at this time               Plan - 04/16/16 1114    Clinical Impression Statement Skilled session focused on initiation of HEP for RLE strengthening, equal weight bearing and midline orientation, see pt instruction.  Requires max verbal and tactile cues for tasks for carryover.  Provided handout with instructions for sister to assist if needed.  Also provided pt with brace that was adjusted from [facility] to wear at home (gave wear schedule) in order to determine if any skin issues/pressure issues would arrise.  Sister not present for PT to speak with regarding exercises and wear schedule, therefore educated pt to show instructions to sister.  Pt verbalized understanding.     Rehab Potential Good   Clinical Impairments Affecting Rehab Potential chronicity of condition   PT Frequency 2x / week   PT Duration 4 weeks   PT Treatment/Interventions ADLs/Self Care Home Management;Electrical Stimulation;DME Instruction;Gait training;Stair training;Functional mobility training;Therapeutic activities;Therapeutic exercise;Balance training;Neuromuscular re-education;Patient/family education;Orthotic Fit/Training;Manual techniques;Energy conservation   PT Next Visit Plan gait training with patients RW and AFO, est HEP for strength and balance, BERG!!   Consulted and Agree with Plan of Care Patient   Family Member Consulted --      Patient will benefit from skilled therapeutic intervention in order to improve the following deficits and impairments:  Abnormal gait, Decreased activity tolerance, Decreased balance, Decreased endurance, Decreased knowledge of use of DME, Decreased mobility, Decreased safety  awareness, Decreased strength, Difficulty walking, Hypermobility, Impaired perceived functional ability, Impaired UE functional use, Improper body mechanics, Postural dysfunction  Visit Diagnosis: Right leg weakness  Unsteadiness on feet  Other abnormalities of gait and mobility     Problem List Patient Active Problem List   Diagnosis Date Noted  . Right leg weakness 04/03/2016  . Abnormality of gait 03/12/2016  . MENTAL RETARDATION, MILD 09/05/2007  . 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 HYPERTENSION 09/05/2007  . 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 HEMORRHAGE, INTRACEREBRAL 06/28/2007  . Complex partial seizure ([place]) 06/28/2007  . H/O craniotomy 06/28/2007  . INFECTION  DUE TO NERVOUS SYSTEM DEVICE/GRAFT 06/22/2007    Cameron Sprang, PT, MPT [facility] [address] [place], [place], [address] Phone: [phone]   Fax:  [phone] 04/16/2016, 11:19 AM  Name: [name] MRN: [id number] Date of Birth: 1976/10/23

## 2016-04-17 ENCOUNTER — Encounter: Payer: Self-pay | Admitting: Rehabilitation

## 2016-04-17 ENCOUNTER — Ambulatory Visit: Payer: Medicare Other | Admitting: Rehabilitation

## 2016-04-17 DIAGNOSIS — R2681 Unsteadiness on feet: Secondary | ICD-10-CM | POA: Diagnosis not present

## 2016-04-17 DIAGNOSIS — R29898 Other symptoms and signs involving the musculoskeletal system: Secondary | ICD-10-CM

## 2016-04-17 DIAGNOSIS — R2689 Other abnormalities of gait and mobility: Secondary | ICD-10-CM

## 2016-04-17 NOTE — Therapy (Signed)
[facility] [address] [place], [place], [address] Phone: [phone]   Fax:  [phone]  Physical Therapy Treatment  Patient Details  Name: [name] MRN: [id number] Date of Birth: May 19, 1976 Referring Provider: Marcial Pacas, MD  Encounter Date: 04/17/2016      PT End of Session - 04/17/16 1138    Visit Number 5   Number of Visits 9   Date for PT Re-Evaluation 05/03/16   PT Start Time 1016   PT Stop Time 1102   PT Time Calculation (min) 46 min   Equipment Utilized During Treatment Gait belt   Activity Tolerance Patient tolerated treatment well   Behavior During Therapy [name] for tasks assessed/performed      Past Medical History  Diagnosis Date  . Seizures ([place])   . Brain tumor (benign) ([place])   . Hypertension   . Mental retardation, mild (I.Q. 50-70)     Past Surgical History  Procedure Laterality Date  . Brain surgery      There were no vitals filed for this visit.      Subjective Assessment - 04/17/16 1052    Subjective "The brace was hurting my foot some yesterday."    Patient is accompained by: Family member   Limitations Walking;Standing   Patient Stated Goals "I want to improve my walking ability"   Currently in Pain? Yes   Pain Score 3    Pain Location Knee   Pain Orientation Right   Pain Type Chronic pain   Pain Onset More than a month ago   Pain Frequency Intermittent   Aggravating Factors  walking   Pain Relieving Factors rest            NMR:  Went over current HEP in order to ensure compliance and proper technique.  Continue to educate on performing sit<>stand with equal WB, using sink as midline guide, using taller surface and working to shorter surface and using UE for support at counter top.  Pt verbalized understanding.  Also verbally went over mini squats during session.  Remainder of session focused on gait without device and with AFO in order to work on increased  weight shift and WB on RLE, R hip protraction and increasing L step length and slower step on the L to increase time in R stance.  Pt able to perform, however requires mod A for facilitation and mod to max verbal cues throughout.  Performed stairs leading with RLE in order to better assess quad control to further address modifications needed to brace.  Note he is able to elevate to step, but with heavy reliance on UEs and min to mod A from therapist.    FYI:  Briefly discussed with pts sister that we are keeping brace to modify so that pt does not get skin breakdown on area of foot.  Sister verbalized understanding.                      PT Education - 04/17/16 1138    Education provided Yes   Education Details Educated on PT keeping brace so that [facility] can make modification to decrease pain at navicular portion of R foot.    Person(s) Educated Patient;Other (comment)  Sister [name]   Methods Explanation   Comprehension Verbalized understanding             PT Long Term Goals - 04/03/16 1517    PT LONG TERM GOAL #1   Title Pt  will be independent with HEP (with handout) in order to indicate improved functional mobility.  (Target Date: 04/30/16)   Baseline dependent with HEP   Status New   PT LONG TERM GOAL #2   Title Pt will improve gait speed to >1.00 ft/sec in order to indicate decreased fall risk and improved efficiency of gait.     Baseline <1.31 ft/sec on 04/03/16   Status New   PT LONG TERM GOAL #3   Title Pt will get AFO assessment and issued appropriate brace to improve gait efficiency and safety.     Baseline has no AFO at this time   PT LONG TERM GOAL #4   Title Pt will ambulate >200' w/ LRAD and AFO at mod I level over indoor and paved outdoor surfaces in order to indicate safe home and limited community mobility.     Baseline min A   PT LONG TERM GOAL #5   Title Will assess BERG and improve score by 4 points in order to indicate decreased fall risk.      Baseline Have not assessed at this time               Plan - 04/17/16 1139    Clinical Impression Statement Skilled session focused on NMR for RLE with ensuring adequate compliance with HEP, gait training with device and with brace, stair negotiation to continue to assess quad control.     Rehab Potential Good   Clinical Impairments Affecting Rehab Potential chronicity of condition   PT Frequency 2x / week   PT Duration 4 weeks   PT Treatment/Interventions ADLs/Self Care Home Management;Electrical Stimulation;DME Instruction;Gait training;Stair training;Functional mobility training;Therapeutic activities;Therapeutic exercise;Balance training;Neuromuscular re-education;Patient/family education;Orthotic Fit/Training;Manual techniques;Energy conservation   PT Next Visit Plan gait training with patients RW and AFO, BERG!!  Give him bridging maybe?  I'm hoping to have his brace back by the time you see him [name] because it was causing red area where padding was-I [name]).    Consulted and Agree with Plan of Care Patient      Patient will benefit from skilled therapeutic intervention in order to improve the following deficits and impairments:  Abnormal gait, Decreased activity tolerance, Decreased balance, Decreased endurance, Decreased knowledge of use of DME, Decreased mobility, Decreased safety awareness, Decreased strength, Difficulty walking, Hypermobility, Impaired perceived functional ability, Impaired UE functional use, Improper body mechanics, Postural dysfunction  Visit Diagnosis: Right leg weakness  Unsteadiness on feet  Other abnormalities of gait and mobility     Problem List Patient Active Problem List   Diagnosis Date Noted  . Right leg weakness 04/03/2016  . Abnormality of gait 03/12/2016  . MENTAL RETARDATION, MILD 09/05/2007  . 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 HYPERTENSION 09/05/2007  . HYDROCEPHALUS, OBSTRUCTIVE  06/28/2007  . HEMORRHAGE, INTRACEREBRAL 06/28/2007  . Complex partial seizure ([place]) 06/28/2007  . H/O craniotomy 06/28/2007  . INFECTION DUE TO NERVOUS SYSTEM DEVICE/GRAFT 06/22/2007    Cameron Sprang, PT, MPT [facility] [address] [place], [place], [address] Phone: [phone]   Fax:  [phone] 04/17/2016, 11:42 AM  Name: [name] MRN: [id number] Date of Birth: 08-Oct-1976

## 2016-04-21 ENCOUNTER — Ambulatory Visit: Payer: Medicare Other | Admitting: Physical Therapy

## 2016-04-21 DIAGNOSIS — R2681 Unsteadiness on feet: Secondary | ICD-10-CM

## 2016-04-21 DIAGNOSIS — R2689 Other abnormalities of gait and mobility: Secondary | ICD-10-CM

## 2016-04-21 NOTE — Therapy (Signed)
[facility] [address] [place], [place], [address] Phone: [phone]   Fax:  [phone]  Physical Therapy Treatment  Patient Details  Name: [name] MRN: [id number] Date of Birth: Dec 19, 1975 Referring Provider: Marcial Pacas, MD  Encounter Date: 04/21/2016      PT End of Session - 04/21/16 1700    Visit Number 6   Number of Visits 9   Date for PT Re-Evaluation 05/03/16   Authorization Type MCR   PT Start Time 1401   PT Stop Time 1449   PT Time Calculation (min) 48 min   Equipment Utilized During Treatment Gait belt   Activity Tolerance Patient tolerated treatment well   Behavior During Therapy [facility] for tasks assessed/performed      Past Medical History  Diagnosis Date  . Seizures ([place])   . Brain tumor (benign) ([place])   . Hypertension   . Mental retardation, mild (I.Q. 50-70)     Past Surgical History  Procedure Laterality Date  . Brain surgery      There were no vitals filed for this visit.      Subjective Assessment - 04/21/16 1403    Subjective "I noticed since last Thursday...my sister noticed, too. I've been doing my therapy exercises a lot, and I think it's helping me put more weight on this (right) side." Pt reports no falls.   Limitations Walking;Standing   Patient Stated Goals "I want to improve my walking ability"   Currently in Pain? No/denies            [facility] PT Assessment - 04/21/16 0001    Standardized Balance Assessment   Standardized Balance Assessment Berg Balance Test   Berg Balance Test   Sit to Stand Able to stand using hands after several tries   Standing Unsupported Able to stand safely 2 minutes   Sitting with Back Unsupported but Feet Supported on Floor or Stool Able to sit safely and securely 2 minutes   Stand to Sit Needs assistance to sit   Transfers Needs one person to assist   Standing Unsupported with Eyes Closed Able to stand 10 seconds with supervision    Standing Ubsupported with Feet Together Needs help to attain position but able to stand for 30 seconds with feet together   From Standing, Reach Forward with Outstretched Arm Can reach forward >12 cm safely (5")   From Standing Position, Pick up Object from Floor Unable to try/needs assist to keep balance  posterior LOB   From Standing Position, Turn to Look Behind Over each Shoulder Looks behind one side only/other side shows less weight shift   Turn 360 Degrees Needs assistance while turning   Standing Unsupported, Alternately Place Feet on Step/Stool Needs assistance to keep from falling or unable to try   Standing Unsupported, One Foot in Front Able to take small step independently and hold 30 seconds   Standing on One Leg Tries to lift leg/unable to hold 3 seconds but remains standing independently   Total Score 24   Berg comment: < 36/56 indicated high fall risk.  Berg performed while wearing R custom AFO.                     [facility] Adult PT Treatment/Exercise - 04/21/16 0001    Transfers   Transfers Stand to Sit;Sit to Stand   Sit to Stand 5: Supervision   Sit to Stand Details (indicate cue type and reason) --   Stand  to Sit 5: Supervision   Stand to Sit Details --   Ambulation/Gait   Ambulation/Gait Yes   Ambulation/Gait Assistance 4: Min guard   Ambulation/Gait Assistance Details Cueing for decreased RLE step length, increased LLE step length to promote more normalized weight shift.   Ambulation Distance (Feet) 340 Feet  120' x2; 50' x2   Assistive device Rolling walker   Gait Pattern Step-to pattern;Decreased arm swing - right;Decreased stride length;Decreased hip/knee flexion - right;Decreased dorsiflexion - right;Right genu recurvatum;Lateral hip instability;Trunk rotated posteriorly on right;Poor foot clearance - right   Ambulation Surface Level;Indoor   Stairs Yes   Stairs Assistance 4: Min assist;4: Min guard   Stairs Assistance Details (indicate cue type and  reason) Ascended forward-facing with LUE at L rail (to simulate home environment); descended backwards with LUE on L rail. Cueing required for sequencing. Assist required for RLE advancement (backwards) during descent   Stair Management Technique One rail Left;Step to pattern;Forwards;Backwards   Number of Stairs 4   Height of Stairs 6   Gait Comments Following session, inspected R ankle for areas of pressure. Noted increased erythema at R navicular. Marked this area on R AFO.                     PT Long Term Goals - 04/21/16 1704    PT LONG TERM GOAL #1   Title Pt will be independent with HEP (with handout) in order to indicate improved functional mobility.  (Target Date: 04/30/16)   Baseline dependent with HEP   Status New   PT LONG TERM GOAL #2   Title Pt will improve gait speed to >1.00 ft/sec in order to indicate decreased fall risk and improved efficiency of gait.     Baseline <1.31 ft/sec on 04/03/16   Status New   PT LONG TERM GOAL #3   Title Pt will get AFO assessment and issued appropriate brace to improve gait efficiency and safety.     Baseline has no AFO at this time   PT LONG TERM GOAL #4   Title Pt will ambulate >200' w/ LRAD and AFO at mod I level over indoor and paved outdoor surfaces in order to indicate safe home and limited community mobility.     Baseline min A   PT LONG TERM GOAL #5   Title Will assess BERG and improve score by 4 points in order to indicate decreased fall risk.     Baseline 6/27: baseline Berg score = 24/56   Status On-going               Plan - 04/21/16 1701    Clinical Impression Statement Completed Berg with score of 24/56, suggesting high fall risk. Addressed stair negotiation, per pt request to negotiate flight of stairs at home. Recommended pt wait to negotiate stairs at home until PT trains sister on appropriate assist/cueing to provide to pt. Noted area of pressure, erythema on R navicular after standing/ambulationg x45  minutes. Marked area on pt's custom AFO to enable orthotist to adjust AFO accordingly.    Rehab Potential Good   Clinical Impairments Affecting Rehab Potential chronicity of condition   PT Frequency 2x / week   PT Duration 4 weeks   PT Treatment/Interventions ADLs/Self Care Home Management;Electrical Stimulation;DME Instruction;Gait training;Stair training;Functional mobility training;Therapeutic activities;Therapeutic exercise;Balance training;Neuromuscular re-education;Patient/family education;Orthotic Fit/Training;Manual techniques;Energy conservation   PT Next Visit Plan gait training with patients RW and AFO. Give him bridging maybe? Review stair negotiation (1 flight with single  L rail, per patient)   Consulted and Agree with Plan of Care Patient      Patient will benefit from skilled therapeutic intervention in order to improve the following deficits and impairments:  Abnormal gait, Decreased activity tolerance, Decreased balance, Decreased endurance, Decreased knowledge of use of DME, Decreased mobility, Decreased safety awareness, Decreased strength, Difficulty walking, Hypermobility, Impaired perceived functional ability, Impaired UE functional use, Improper body mechanics, Postural dysfunction  Visit Diagnosis: Other abnormalities of gait and mobility  Unsteadiness on feet     Problem List Patient Active Problem List   Diagnosis Date Noted  . Right leg weakness 04/03/2016  . Abnormality of gait 03/12/2016  . MENTAL RETARDATION, MILD 09/05/2007  . 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 HYPERTENSION 09/05/2007  . 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 HEMORRHAGE, INTRACEREBRAL 06/28/2007  . Complex partial seizure ([place]) 06/28/2007  . H/O craniotomy 06/28/2007  . INFECTION DUE TO NERVOUS SYSTEM DEVICE/GRAFT 06/22/2007    Billie Ruddy, PT, DPT [facility] [address] [place], [place],  [address] Phone: [phone]   Fax:  [phone] 04/21/2016, 5:06 PM  Name: [name] MRN: [id number] Date of Birth: Mar 08, 1976

## 2016-04-24 ENCOUNTER — Ambulatory Visit: Payer: Medicare Other | Admitting: Rehabilitation

## 2016-04-24 ENCOUNTER — Encounter: Payer: Self-pay | Admitting: Rehabilitation

## 2016-04-24 DIAGNOSIS — R2689 Other abnormalities of gait and mobility: Secondary | ICD-10-CM

## 2016-04-24 DIAGNOSIS — M6281 Muscle weakness (generalized): Secondary | ICD-10-CM

## 2016-04-24 DIAGNOSIS — R2681 Unsteadiness on feet: Secondary | ICD-10-CM | POA: Diagnosis not present

## 2016-04-24 DIAGNOSIS — R29898 Other symptoms and signs involving the musculoskeletal system: Secondary | ICD-10-CM

## 2016-04-24 NOTE — Therapy (Signed)
[facility] [address] [place], [place], [address] Phone: [phone]   Fax:  [phone]  Physical Therapy Treatment  Patient Details  Name: [name] MRN: [id number] Date of Birth: 10/30/1975 Referring Provider: Marcial Pacas, MD  Encounter Date: 04/24/2016      PT End of Session - 04/24/16 1538    Visit Number 7   Number of Visits 9   Date for PT Re-Evaluation 05/03/16   Authorization Type MCR-G code needed every 10th visit   PT Start Time 1025  used restroom at beginning of session, did not bill   PT Stop Time 1103   PT Time Calculation (min) 38 min   Equipment Utilized During Treatment Gait belt   Activity Tolerance Patient tolerated treatment well   Behavior During Therapy [facility] for tasks assessed/performed      Past Medical History  Diagnosis Date  . Seizures ([place])   . Brain tumor (benign) ([place])   . Hypertension   . Mental retardation, mild (I.Q. 50-70)     Past Surgical History  Procedure Laterality Date  . Brain surgery      There were no vitals filed for this visit.      Subjective Assessment - 04/24/16 1045    Subjective "I'm walking a little bit faster."    Limitations Walking;Standing   Patient Stated Goals "I want to improve my walking ability"   Currently in Pain? Yes   Pain Score 2    Pain Location Knee   Pain Orientation Right   Pain Descriptors / Indicators Aching   Pain Type Chronic pain   Pain Onset More than a month ago   Pain Frequency Intermittent   Aggravating Factors  walking   Pain Relieving Factors rest           Gait:  Performed gait x 115' x 1 and another 74' x 1 with RW and custom R AFO at min/guard to min A (for facilitation) with cues and facilitation for upright posture, increased R lateral/forward weight shift during R stance.  Provided cues for increased L step length and keeping RW moving throughout gait while not increasing speed of gait.  Pt  requires constant cues to keep RW moving, however did improve carryover with increased L step length.  Pt also continues to report improved pain in R knee while wearing brace.  Also cues for upright posture throughout.    NMR:  Worked on sit to mini stand (squat position) while reaching for targets to the R to increase weight shift and WB on RLE.  Provided heavy facilitation for increased forward trunk lean to improve standing.  Continue to note marked weakness in RLE and LLE compensations during actiivty.  Progressed task to having pt keep RUE planted on chair arm rest while reaching LUE over to the R for targets and noted marked improvement.  Continued to provide facilitation for forward and R lateral weight shift as well as max verbal cues for controlled descent.                        PT Education - 04/24/16 1514    Education provided Yes   Education Details Educated on brace wear schedule (on two hours, off 1 hour throughout the day)   Person(s) Educated Patient   Methods Explanation   Comprehension Verbalized understanding             PT Long Term Goals - 04/21/16 1704  PT LONG TERM GOAL #1   Title Pt will be independent with HEP (with handout) in order to indicate improved functional mobility.  (Target Date: 04/30/16)   Baseline dependent with HEP   Status New   PT LONG TERM GOAL #2   Title Pt will improve gait speed to >1.00 ft/sec in order to indicate decreased fall risk and improved efficiency of gait.     Baseline <1.31 ft/sec on 04/03/16   Status New   PT LONG TERM GOAL #3   Title Pt will get AFO assessment and issued appropriate brace to improve gait efficiency and safety.     Baseline has no AFO at this time   PT LONG TERM GOAL #4   Title Pt will ambulate >200' w/ LRAD and AFO at mod I level over indoor and paved outdoor surfaces in order to indicate safe home and limited community mobility.     Baseline min A   PT LONG TERM GOAL #5   Title Will assess  BERG and improve score by 4 points in order to indicate decreased fall risk.     Baseline 6/27: baseline Berg score = 24/56   Status On-going               Plan - 04/24/16 1540    Clinical Impression Statement Skilled session focused on gait training with R custom AFO and RW in order to work on safety and improved gait mechanics.  Also addressed exercises to increase RLE WB/activation.  Following session, PT checked skin at R navicular.  Note very mild erythema that improved following discussion about brace wear schedule.     Rehab Potential Good   Clinical Impairments Affecting Rehab Potential chronicity of condition   PT Frequency 2x / week   PT Duration 4 weeks   PT Treatment/Interventions ADLs/Self Care Home Management;Electrical Stimulation;DME Instruction;Gait training;Stair training;Functional mobility training;Therapeutic activities;Therapeutic exercise;Balance training;Neuromuscular re-education;Patient/family education;Orthotic Fit/Training;Manual techniques;Energy conservation   PT Next Visit Plan gait training with patients RW and AFO. Give him bridging maybe? Review stair negotiation (1 flight with single L rail, per patient)   Consulted and Agree with Plan of Care Patient      Patient will benefit from skilled therapeutic intervention in order to improve the following deficits and impairments:  Abnormal gait, Decreased activity tolerance, Decreased balance, Decreased endurance, Decreased knowledge of use of DME, Decreased mobility, Decreased safety awareness, Decreased strength, Difficulty walking, Hypermobility, Impaired perceived functional ability, Impaired UE functional use, Improper body mechanics, Postural dysfunction  Visit Diagnosis: Other abnormalities of gait and mobility  Unsteadiness on feet  Right leg weakness  Muscle weakness (generalized)     Problem List Patient Active Problem List   Diagnosis Date Noted  . Right leg weakness 04/03/2016  .  Abnormality of gait 03/12/2016  . MENTAL RETARDATION, MILD 09/05/2007  . 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 HYPERTENSION 09/05/2007  . 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 HEMORRHAGE, INTRACEREBRAL 06/28/2007  . Complex partial seizure ([place]) 06/28/2007  . H/O craniotomy 06/28/2007  . INFECTION DUE TO NERVOUS SYSTEM DEVICE/GRAFT 06/22/2007    Cameron Sprang, PT, MPT [facility] [address] [place], [place], [address] Phone: [phone]   Fax:  [phone] 04/24/2016, 3:46 PM  Name: [name] MRN: [id number] Date of Birth: 1975-12-10

## 2016-04-27 ENCOUNTER — Ambulatory Visit: Payer: Medicare Other | Attending: Neurology | Admitting: Rehabilitation

## 2016-04-27 DIAGNOSIS — R29898 Other symptoms and signs involving the musculoskeletal system: Secondary | ICD-10-CM | POA: Insufficient documentation

## 2016-04-27 DIAGNOSIS — R2681 Unsteadiness on feet: Secondary | ICD-10-CM | POA: Insufficient documentation

## 2016-04-27 DIAGNOSIS — R2689 Other abnormalities of gait and mobility: Secondary | ICD-10-CM | POA: Insufficient documentation

## 2016-05-01 ENCOUNTER — Encounter: Payer: Self-pay | Admitting: Rehabilitation

## 2016-05-01 ENCOUNTER — Ambulatory Visit: Payer: Medicare Other | Admitting: Rehabilitation

## 2016-05-01 DIAGNOSIS — R2681 Unsteadiness on feet: Secondary | ICD-10-CM | POA: Diagnosis present

## 2016-05-01 DIAGNOSIS — R2689 Other abnormalities of gait and mobility: Secondary | ICD-10-CM

## 2016-05-01 DIAGNOSIS — R29898 Other symptoms and signs involving the musculoskeletal system: Secondary | ICD-10-CM

## 2016-05-01 NOTE — Therapy (Signed)
[facility] [address] [place], [place], [address] Phone: [phone]   Fax:  [phone]  Physical Therapy Treatment and D/C Summary   Patient Details  Name: [name] MRN: [id number] Date of Birth: [birth year] Referring Provider: Marcial Pacas, MD  Encounter Date: 05/01/2016      PT End of Session - 05/01/16 0938    Visit Number 8   Number of Visits 9   Date for PT Re-Evaluation 05/03/16   Authorization Type MCR-G code needed every 10th visit   PT Start Time 0930   PT Stop Time 1015   PT Time Calculation (min) 45 min   Equipment Utilized During Treatment Gait belt   Activity Tolerance Patient tolerated treatment well   Behavior During Therapy [facility] for tasks assessed/performed      Past Medical History  Diagnosis Date  . Seizures ([place])   . Brain tumor (benign) ([place])   . Hypertension   . Mental retardation, mild (I.Q. 50-70)     Past Surgical History  Procedure Laterality Date  . Brain surgery      There were no vitals filed for this visit.      Subjective Assessment - 05/01/16 0937    Subjective I've been working on moving the walker better when I walk and I can tell a difference.    Limitations Walking;Standing   Patient Stated Goals "I want to improve my walking ability"   Currently in Pain? No/denies                         [facility] Adult PT Treatment/Exercise - 05/01/16 1003    Standardized Balance Assessment   Standardized Balance Assessment Berg Balance Test   Berg Balance Test   Sit to Stand Able to stand  independently using hands   Standing Unsupported Able to stand safely 2 minutes   Sitting with Back Unsupported but Feet Supported on Floor or Stool Able to sit safely and securely 2 minutes   Stand to Sit Controls descent by using hands   Transfers Able to transfer with verbal cueing and /or supervision   Standing Unsupported with Eyes Closed Able to stand 10 seconds  with supervision   Standing Ubsupported with Feet Together Needs help to attain position but able to stand for 30 seconds with feet together   From Standing, Reach Forward with Outstretched Arm Reaches forward but needs supervision   From Standing Position, Pick up Object from Floor Unable to pick up and needs supervision   From Standing Position, Turn to Look Behind Over each Shoulder Turn sideways only but maintains balance   Turn 360 Degrees Needs assistance while turning   Standing Unsupported, Alternately Place Feet on Step/Stool Needs assistance to keep from falling or unable to try   Standing Unsupported, One Foot in [employer] balance while stepping or standing   Standing on One Leg Unable to try or needs assist to prevent fall   Total Score 24        NMR:  Performed BERG balance test, see results above.  Note no increase in score since last testing.  Educated on need for RW and AFO at all times when up ambulating to prevent fall.    TA:  Assessed gait speed with 16mwalk test with time of .34 ft/sec, indicative of recurrent fall risk.  Discussed this with pt in length and how increasing speed may increase fall risk, therefore would not recommend  pt increase speed much more than it currently is, however continue to address gait mechanics as we have been in therapy.    Gait:  Continue to educate on posture, increased L step length and continued movement with RW during gait x 100'.  Otherwise pt ambulatory at mod I level.  Performed stair negotiation sideways to ascend and backwards to descend for increased safety.  Requires min/guard for safety with cues for sequencing.          PT Education - 05/01/16 1248    Education provided Yes   Education Details Education on D/C from therapy, increasing wear time of brace by week, and contacting [employer] or [place] as needed.    Person(s) Educated Patient   Methods Explanation;Handout  phone number for [employer] and [place]   Comprehension Verbalized  understanding             PT Long Term Goals - 05/01/16 0939    PT LONG TERM GOAL #1   Title Pt will be independent with HEP (with handout) in order to indicate improved functional mobility.  (Target Date: 04/30/16)   Baseline met    Status Achieved   PT LONG TERM GOAL #2   Title Pt will improve gait speed to >1.00 ft/sec in order to indicate decreased fall risk and improved efficiency of gait.     Baseline .34 ft/sec on 05/01/16   Status Not Met   PT LONG TERM GOAL #3   Title Pt will get AFO assessment and issued appropriate brace to improve gait efficiency and safety.     Baseline Has custom AFO made by [employer]   Status Achieved   PT LONG TERM GOAL #4   Title Pt will ambulate >200' w/ LRAD and AFO at mod I level over indoor and paved outdoor surfaces in order to indicate safe home and limited community mobility.     Baseline met 05/01/16   Status Achieved   PT LONG TERM GOAL #5   Title Will assess BERG and improve score by 4 points in order to indicate decreased fall risk.     Baseline 6/27: baseline Berg score = 24/56,  24/56 on 7/717 as well   Status Not Met               Plan - 05/01/16 0939    Clinical Impression Statement Skilled session focused on addressing LTG's for D/C from therapy.  Pt as met 3/5 LTGs.  Unable to meet gait speed goal and BERG goal, however educated pt that slower gait speed is actually safer for him and the use of a RW and AFO have greatly decreased his fall risk.  Pt agreed and verbalized understanding and is ready for D/C.    Rehab Potential Good   Clinical Impairments Affecting Rehab Potential chronicity of condition   PT Frequency 2x / week   PT Duration 4 weeks   PT Treatment/Interventions ADLs/Self Care Home Management;Electrical Stimulation;DME Instruction;Gait training;Stair training;Functional mobility training;Therapeutic activities;Therapeutic exercise;Balance training;Neuromuscular re-education;Patient/family education;Orthotic  Fit/Training;Manual techniques;Energy conservation   PT Next Visit Plan gait training with patients RW and AFO. Give him bridging maybe? Review stair negotiation (1 flight with single L rail, per patient)   Consulted and Agree with Plan of Care Patient      Patient will benefit from skilled therapeutic intervention in order to improve the following deficits and impairments:  Abnormal gait, Decreased activity tolerance, Decreased balance, Decreased endurance, Decreased knowledge of use of DME, Decreased mobility, Decreased safety awareness, Decreased  strength, Difficulty walking, Hypermobility, Impaired perceived functional ability, Impaired UE functional use, Improper body mechanics, Postural dysfunction  Visit Diagnosis: Other abnormalities of gait and mobility  Unsteadiness on feet  Right leg weakness       G-Codes - May 27, 2016 1252    Functional Assessment Tool Used mod I with RW and R AFO for gait (indoors)   Functional Limitation Mobility: Walking and moving around   Mobility: Walking and Moving Around Current Status [phone]) 0 percent impaired, limited or restricted   Mobility: Walking and Moving Around Goal Status [phone] 0 percent impaired, limited or restricted   Mobility: Walking and Moving Around Discharge Status [phone]) 0 percent impaired, limited or restricted      PHYSICAL THERAPY DISCHARGE SUMMARY  Visits from Start of Care: 8  Current functional level related to goals / functional outcomes: See above   Remaining deficits: See LTGs above   Education / Equipment: HEP  Plan: Patient agrees to discharge.  Patient goals were partially met. Patient is being discharged due to meeting the stated rehab goals.  ?????        Problem List Patient Active Problem List   Diagnosis Date Noted  . Right leg weakness 04/03/2016  . Abnormality of gait 03/12/2016  . MENTAL RETARDATION, MILD 09/05/2007  . 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  HYPERTENSION 09/05/2007  . 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 HEMORRHAGE, INTRACEREBRAL 06/28/2007  . Complex partial seizure ([place]) 06/28/2007  . H/O craniotomy 06/28/2007  . INFECTION DUE TO NERVOUS SYSTEM DEVICE/GRAFT 06/22/2007    Cameron Sprang, PT, MPT [facility] [address] [place], [place], [address] Phone: [phone]   Fax:  [phone] 2016/05/27, 12:54 PM  Name: KRYSTIAN YOUNGLOVE MRN: [id number] Date of Birth: 10-May-1976

## 2017-02-05 ENCOUNTER — Ambulatory Visit (INDEPENDENT_AMBULATORY_CARE_PROVIDER_SITE_OTHER): Payer: Medicare Other | Admitting: Podiatry

## 2017-02-05 ENCOUNTER — Ambulatory Visit (INDEPENDENT_AMBULATORY_CARE_PROVIDER_SITE_OTHER): Payer: Medicare Other

## 2017-02-05 DIAGNOSIS — M79671 Pain in right foot: Secondary | ICD-10-CM

## 2017-02-05 DIAGNOSIS — M779 Enthesopathy, unspecified: Secondary | ICD-10-CM | POA: Diagnosis not present

## 2017-02-05 DIAGNOSIS — M21371 Foot drop, right foot: Secondary | ICD-10-CM | POA: Diagnosis not present

## 2017-02-05 MED ORDER — DICLOFENAC SODIUM 75 MG PO TBEC: 75.0000 mg | DELAYED_RELEASE_TABLET | Freq: Two times a day (BID) | ORAL | 2 refills | Status: DC

## 2017-02-07 NOTE — Progress Notes (Signed)
Subjective:     Patient ID: [name], male   DOB: 1976-10-10, 41 y.o.   MRN: [id number]  HPI patient presents with caregiver in very poor health who is in a wheelchair was just complaining of numbness in his right foot and admits she's not been wearing the brace that he has for foot drop   Review of Systems     Objective:   Physical Exam Neurovascular status was found to be intact with patient having diminished muscle strength and a fixed equinus condition right secondary to numerous mental physical conditions. I did not note any circulatory loss and I did not note any advanced neurological deficit    Assessment:     Patient is difficult to communicate with some it's difficult to make complete determination but it does appear to be more of a ongoing chronic problem    Plan:     I reviewed x-ray and discussed the importance of wearing brace with family. Patient will be seen back for [place] to recheck again as needed and he'll continue to monitor and I offered to make him a new brace at one point in future

## 2017-07-28 ENCOUNTER — Emergency Department (HOSPITAL_COMMUNITY): Payer: Medicare Other

## 2017-07-28 ENCOUNTER — Encounter (HOSPITAL_COMMUNITY): Payer: Self-pay

## 2017-07-28 ENCOUNTER — Emergency Department (HOSPITAL_COMMUNITY)
Admission: EM | Admit: 2017-07-28 | Discharge: 2017-07-29 | Disposition: A | Discharge status: Home / Self Care | Payer: Medicare Other | Attending: Emergency Medicine | Admitting: Emergency Medicine

## 2017-07-28 DIAGNOSIS — M6281 Muscle weakness (generalized): Secondary | ICD-10-CM | POA: Insufficient documentation

## 2017-07-28 DIAGNOSIS — I10 Essential (primary) hypertension: Secondary | ICD-10-CM | POA: Diagnosis not present

## 2017-07-28 DIAGNOSIS — R0789 Other chest pain: Secondary | ICD-10-CM | POA: Diagnosis not present

## 2017-07-28 DIAGNOSIS — R479 Unspecified speech disturbances: Secondary | ICD-10-CM

## 2017-07-28 DIAGNOSIS — R52 Pain, unspecified: Secondary | ICD-10-CM

## 2017-07-28 DIAGNOSIS — E860 Dehydration: Secondary | ICD-10-CM

## 2017-07-28 DIAGNOSIS — Z79899 Other long term (current) drug therapy: Secondary | ICD-10-CM | POA: Insufficient documentation

## 2017-07-28 LAB — BASIC METABOLIC PANEL
Anion gap: 16 — ABNORMAL HIGH (ref 5–15)
BUN: 9 mg/dL (ref 6–20)
CALCIUM: 9.9 mg/dL (ref 8.9–10.3)
CHLORIDE: 97 mmol/L — AB (ref 101–111)
CO2: 24 mmol/L (ref 22–32)
CREATININE: 1 mg/dL (ref 0.61–1.24)
Glucose, Bld: 123 mg/dL — ABNORMAL HIGH (ref 65–99)
Potassium: 3.4 mmol/L — ABNORMAL LOW (ref 3.5–5.1)
SODIUM: 137 mmol/L (ref 135–145)

## 2017-07-28 LAB — HEPATIC FUNCTION PANEL
ALBUMIN: 3.9 g/dL (ref 3.5–5.0)
ALT: 8 U/L — AB (ref 17–63)
AST: 13 U/L — AB (ref 15–41)
Alkaline Phosphatase: 53 U/L (ref 38–126)
TOTAL PROTEIN: 8.6 g/dL — AB (ref 6.5–8.1)
Total Bilirubin: 0.7 mg/dL (ref 0.3–1.2)

## 2017-07-28 LAB — CBC
HCT: 39.5 % (ref 39.0–52.0)
Hemoglobin: 13.5 g/dL (ref 13.0–17.0)
MCH: 30.8 pg (ref 26.0–34.0)
MCHC: 34.2 g/dL (ref 30.0–36.0)
MCV: 90 fL (ref 78.0–100.0)
PLATELETS: 289 10*3/uL (ref 150–400)
RBC: 4.39 MIL/uL (ref 4.22–5.81)
RDW: 13.2 % (ref 11.5–15.5)
WBC: 10.2 10*3/uL (ref 4.0–10.5)

## 2017-07-28 LAB — URINALYSIS, ROUTINE W REFLEX MICROSCOPIC
Bilirubin Urine: NEGATIVE
Glucose, UA: NEGATIVE mg/dL
HGB URINE DIPSTICK: NEGATIVE
Ketones, ur: 5 mg/dL — AB
Leukocytes, UA: NEGATIVE
NITRITE: NEGATIVE
PROTEIN: NEGATIVE mg/dL
SPECIFIC GRAVITY, URINE: 1.015 (ref 1.005–1.030)
pH: 6 (ref 5.0–8.0)

## 2017-07-28 LAB — CK: CK TOTAL: 299 U/L (ref 49–397)

## 2017-07-28 LAB — I-STAT TROPONIN, ED: TROPONIN I, POC: 0 ng/mL (ref 0.00–0.08)

## 2017-07-28 LAB — I-STAT CG4 LACTIC ACID, ED: Lactic Acid, Venous: 3.5 mmol/L (ref 0.5–1.9)

## 2017-07-28 LAB — D-DIMER, QUANTITATIVE: D-Dimer, Quant: 0.83 ug/mL-FEU — ABNORMAL HIGH (ref 0.00–0.50)

## 2017-07-28 MED ORDER — IOPAMIDOL (ISOVUE-370) INJECTION 76%
INTRAVENOUS | Status: AC
  Administered 2017-07-28: 100 mL
  Filled 2017-07-28: qty 100

## 2017-07-28 MED ORDER — SODIUM CHLORIDE 0.9 % IV BOLUS (SEPSIS)
1000.0000 mL | Freq: Once | INTRAVENOUS | Status: AC
  Administered 2017-07-28: 1000 mL via INTRAVENOUS

## 2017-07-28 NOTE — ED Provider Notes (Signed)
[facility] DEPT Provider Note   CSN: [id number] Arrival date & time: 07/28/17  1549     History   Chief Complaint Chief Complaint  Patient presents with  . generalized pain/ right leg pain    HPI [name] is a 41 y.o. male.  41yo M w/ PMH including previous craniotomy and brain tumor resection, VP shunt, mild MR, seizures who p/w eneralized body pain and speech problems. The patient has had 2 days of generalized body pain, generalized weakness associated with occasional chills but no fevers. He has had some diarrhea but no vomiting, cough/cold sx, abdominal pain, urinary symptoms, headache or shortness of breath. He does report occasional chest tightness. No sick contacts at home. He is compliant with medications, no recent medication changes. He denies any new weakness/numbness, has hx of chronic R sided weakness.  Family notes slurred/impaired speech since yesterday, they state he normally talks more fluently.       Past Medical History:  Diagnosis Date  . Brain tumor (benign) ([place])   . Hypertension   . Mental retardation, mild (I.Q. 50-70)   . Seizures [facility])     Patient Active Problem List   Diagnosis Date Noted  . Right leg weakness 04/03/2016  . Abnormality of gait 03/12/2016  . MENTAL RETARDATION, MILD 09/05/2007  . 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 HYPERTENSION 09/05/2007  . 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 HEMORRHAGE, INTRACEREBRAL 06/28/2007  . Complex partial seizure ([place]) 06/28/2007  . H/O craniotomy 06/28/2007  . INFECTION DUE TO NERVOUS SYSTEM DEVICE/GRAFT 06/22/2007    Past Surgical History:  Procedure Laterality Date  . BRAIN SURGERY         Home Medications    Prior to Admission medications   Medication Sig Start Date End Date Taking? Authorizing Provider  divalproex (DEPAKOTE) 500 MG DR tablet Take 2 tablets (1,000 mg total) by mouth 2 (two) times daily. 03/12/16  Yes Marcial Pacas, MD    valsartan-hydrochlorothiazide (DIOVAN-HCT) 80-12.5 MG tablet Take 1 tablet by mouth daily. 02/11/16  Yes [provider]  diclofenac (VOLTAREN) 75 MG EC tablet Take 1 tablet (75 mg total) by mouth 2 (two) times daily. Patient not taking: Reported on 07/28/2017 02/05/17   Wallene Huh, DPM    Family History Family History  Problem Relation Age of Onset  . Diabetes Mother   . Hypertension Mother   . Atrial fibrillation Mother   . Prostate cancer Father   . Multiple sclerosis Sister        Deceased  . Multiple sclerosis Sister        Deceased  . Hypertension Sister     Social History Social History  Substance Use Topics  . Smoking status: Never Smoker  . Smokeless tobacco: Not on file  . Alcohol use No     Allergies   Patient has no known allergies.   Review of Systems Review of Systems Unable to obtain 2/2 mental retardation  Physical Exam Updated Vital Signs BP (!) 126/98 (BP Location: Right Arm)   Pulse 81   Temp 98.9 F (37.2 C) (Rectal)   Resp 18   Ht 6\' 1"  (1.854 m)   Wt 80.7 kg (178 lb)   SpO2 100%   BMI 23.48 kg/m   Physical Exam  Constitutional: He appears well-developed and well-nourished. No distress.  HENT:  Head: Normocephalic and atraumatic.  Nose: Nose normal.  Chronic skull defect L parietal skull, dry mucous membranes  Eyes: Conjunctivae and  EOM are normal.  L pupil 2 mm reactive, R pupil 24mm sluggishly reactive; blind R eye  Neck: Neck supple.  Cardiovascular: Regular rhythm and normal heart sounds.  Tachycardia present.   No murmur heard. Pulmonary/Chest: Effort normal and breath sounds normal.  Abdominal: Soft. Bowel sounds are normal. He exhibits no distension. There is no tenderness.  Musculoskeletal: He exhibits no edema.  Neurological: He is alert.  Delayed speech but able to answer questions appropriately, awake and alert; R facial droop at mouth; 4/5 strength RUE, RLE  Skin: Skin is warm and dry.  Nursing note and  vitals reviewed.    ED Treatments / Results  Labs (all labs ordered are listed, but only abnormal results are displayed) Labs Reviewed  BASIC METABOLIC PANEL - Abnormal; Notable for the following:       Result Value   Potassium 3.4 (*)    Chloride 97 (*)    Glucose, Bld 123 (*)    Anion gap 16 (*)    All other components within normal limits  HEPATIC FUNCTION PANEL - Abnormal; Notable for the following:    Total Protein 8.6 (*)    AST 13 (*)    ALT 8 (*)    Bilirubin, Direct <0.1 (*)    All other components within normal limits  URINALYSIS, ROUTINE W REFLEX MICROSCOPIC - Abnormal; Notable for the following:    Ketones, ur 5 (*)    All other components within normal limits  D-DIMER, QUANTITATIVE (NOT AT [facility]) - Abnormal; Notable for the following:    D-Dimer, Quant 0.83 (*)    All other components within normal limits  I-STAT CG4 LACTIC ACID, ED - Abnormal; Notable for the following:    Lactic Acid, Venous 3.50 (*)    All other components within normal limits  CULTURE, BLOOD (ROUTINE X 2)  CULTURE, BLOOD (ROUTINE X 2)  URINE CULTURE  CBC  CK  I-STAT TROPONIN, ED  I-STAT CG4 LACTIC ACID, ED    EKG  EKG Interpretation  Date/Time:  Wednesday July 28 2017 16:04:30 EDT Ventricular Rate:  133 PR Interval:    QRS Duration: 78 QT Interval:  374 QTC Calculation: 556 R Axis:   -5 Text Interpretation:  ** Critical Test Result: Long QTc Sinus tachycardia ST & T wave abnormality, consider inferior ischemia ST & T wave abnormality, consider anterolateral ischemia Abnormal ECG long QT and diffuse T wave inversions new from previous Confirmed by Theotis Burrow [phone]) on 07/28/2017 8:38:56 PM       Radiology Dg Chest 2 View  Result Date: 07/28/2017 CLINICAL DATA:  Generalized weakness and body aches EXAM: CHEST  2 VIEW COMPARISON:  08/08/2010 chest radiograph. FINDINGS: Visualized portions of the left-sided VP shunt appear intact with no kink or discontinuity. Long segment  prominent thoracolumbar dextrocurvature. Stable cardiomediastinal silhouette with normal heart size. No pneumothorax. No pleural effusion. Lungs appear clear, with no acute consolidative airspace disease and no pulmonary edema. IMPRESSION: No active cardiopulmonary disease. Visualized intrathoracic portion of the left-sided VP shunt appears intact . Electronically Signed   By: Ilona Sorrel M.D.   On: 07/28/2017 18:55   Ct Head Wo Contrast  Result Date: 07/28/2017 CLINICAL DATA:  Initial evaluation for acute slurred speech, weakness. History of seizures. EXAM: CT HEAD WITHOUT CONTRAST TECHNIQUE: Contiguous axial images were obtained from the base of the skull through the vertex without intravenous contrast. COMPARISON:  Prior CT and MRI from 12/16/2006. FINDINGS: Brain: Postoperative changes from previous left frontotemporal and parietal craniectomy  with extensive underlying left temporoparietal encephalomalacia is relatively stable from previous. Two shunt catheters remain in place within this region, unchanged. Again, the more posterior of these catheters appears to be orphaned and non functional. Communication with the dilated posterior horn of the left lateral ventricle, unchanged. Ventricular size is within normal limits without hydrocephalus. There is been mildly progressive cerebral atrophy as compared to prior exam. Mild right-to-left shift due to volume loss noted. No acute intracranial hemorrhage. No evidence for acute large vessel territory infarct. No extra-axial fluid collection. Vascular: No hyperdense vessel. Scattered vascular calcifications noted within the carotid siphons. Skull: Scalp soft tissues demonstrate no acute abnormality. Large calvarial defect due to prior left craniectomy. Shunt catheter within the left posterior scalp. Sinuses/Orbits: Globes and orbital soft tissues demonstrate no acute abnormality. Chronic left maxillary sinusitis noted. Paranasal sinuses otherwise clear. No  mastoid effusion. Other: None. IMPRESSION: 1. No acute intracranial process. 2. Stable postoperative changes from prior left frontotemporal and parietal craniectomy with underlying encephalomalacia and shunt catheters in place. No hydrocephalus. 3. Chronic left maxillary sinusitis. Electronically Signed   By: Jeannine Boga M.D.   On: 07/28/2017 20:27   Ct Angio Chest Pe W/cm &/or Wo Cm  Result Date: 07/28/2017 CLINICAL DATA:  Positive D-dimer. EXAM: CT ANGIOGRAPHY CHEST WITH CONTRAST TECHNIQUE: Multidetector CT imaging of the chest was performed using the standard protocol during bolus administration of intravenous contrast. Multiplanar CT image reconstructions and MIPs were obtained to evaluate the vascular anatomy. CONTRAST:  100 mL Isovue 370 COMPARISON:  None. FINDINGS: Cardiovascular: Satisfactory opacification of the pulmonary arteries to the segmental level. No evidence of pulmonary embolism. Normal heart size. No pericardial effusion. Normal caliber thoracic aorta. Mediastinum/Nodes: No enlarged mediastinal, hilar, or axillary lymph nodes. Thyroid gland, trachea, and esophagus demonstrate no significant findings. Lungs/Pleura: Atelectasis in the lung bases. No consolidation or airspace disease. No pleural effusions. No pneumothorax. Airways are patent. Upper Abdomen: No acute process demonstrated in the visualized upper abdominal organs. Musculoskeletal: Thoracic scoliosis convex towards the right. No destructive bone lesions. Review of the MIP images confirms the above findings. IMPRESSION: No evidence of significant pulmonary embolus. No evidence of active pulmonary disease. Thoracic scoliosis. Electronically Signed   By: Lucienne Capers M.D.   On: 07/28/2017 22:14    Procedures Procedures (including critical care time)  Medications Ordered in ED Medications  sodium chloride 0.9 % bolus 1,000 mL (0 mLs Intravenous Stopped 07/28/17 2009)  iopamidol (ISOVUE-370) 76 % injection (100 mLs   Contrast Given 07/28/17 2153)     Initial Impression / Assessment and Plan / ED Course  I have reviewed the triage vital signs and the nursing notes.  Pertinent labs & imaging results that were available during my care of the patient were reviewed by me and considered in my medical decision making (see chart for details).     Pt w/ 2 d of generalized weakness and body aches, no fevers or vomiting. Family also concerned about change in speech today. He was comfortable on exam, vital signs notable for mild tachycardia but afebrile, normal O2 saturation, reassuring BP. Aside from his speech difficulty, the remainder of his neurologic exam was his baseline according to family with chronic right-sided weakness. Labs notable for initial lactate of 3.5, AG 16, normal CBC, negative troponin, unremarkable UA with only a few ketones. Gave 2L IVF and repeat lactate was normal. chest x-ray negative acute. Obtained a head CT because of speech problems which was not changed from previous.  He continued  to be mildly tachycardic therefore added d-dimer because he mentioned intermittent chest pain. D-dimer elevated however CTA chest negative for PE or other acute process. With tachycardia and initially elevated lactate, blood cultures added however pt with no other symptoms concerning for infection and his work up has been reassuring. Discussed his speech problems with neurology, Dr. Leonel Ramsay, who felt that sx may be recrudescence of his original symptoms because of his current illness. He recommended outpatient f/u with his neurologist.  The patient remains well-appearing on reexamination and continues to have normal vital signs, his heart rate has normalized after fluids. Given reassuring workup I feel he is safe for discharge but I have recommended close follow-up with PCP as well as his neurologist. Extensively reviewed return precautions with his family. They voiced understanding and patient was discharged in  satisfactory condition.  Final Clinical Impressions(s) / ED Diagnoses   Final diagnoses:  Generalized body aches  Dehydration  Speech problem    New Prescriptions New Prescriptions   No medications on file     Little, Wenda Overland, MD 07/29/17 [phone]

## 2017-07-28 NOTE — ED Notes (Signed)
Patient transported to CT 

## 2017-07-28 NOTE — ED Triage Notes (Signed)
Patient complains of generalized body pain and family member reports an episode of slurred speech earlier today. On assessment patient alert to baseline and answers all questions appropriately. States that he feels weak. Hx of seizures but none today

## 2017-07-28 NOTE — ED Notes (Signed)
Patient transported to x-ray. ?

## 2017-07-29 DIAGNOSIS — M6281 Muscle weakness (generalized): Secondary | ICD-10-CM | POA: Diagnosis not present

## 2017-07-29 LAB — I-STAT CG4 LACTIC ACID, ED: Lactic Acid, Venous: 1.64 mmol/L (ref 0.5–1.9)

## 2017-07-29 NOTE — Discharge Instructions (Signed)
RETURN TO ER IF ANY NEW SUDDEN CHANGES IN SYMPTOMS, SEVERE VOMITING, CONCERNS FOR DEHYDRATION, LETHARGY, OR BREATHING PROBLEMS.

## 2017-07-30 LAB — URINE CULTURE
CULTURE: NO GROWTH
Special Requests: NORMAL

## 2017-08-02 LAB — CULTURE, BLOOD (ROUTINE X 2)
CULTURE: NO GROWTH
SPECIAL REQUESTS: ADEQUATE

## 2017-08-03 LAB — CULTURE, BLOOD (ROUTINE X 2)
Culture: NO GROWTH
Special Requests: ADEQUATE

## 2018-12-27 ENCOUNTER — Other Ambulatory Visit: Payer: Self-pay

## 2018-12-27 ENCOUNTER — Emergency Department (HOSPITAL_COMMUNITY): Payer: Medicare Other

## 2018-12-27 ENCOUNTER — Emergency Department (HOSPITAL_COMMUNITY)
Admission: EM | Admit: 2018-12-27 | Discharge: 2018-12-27 | Disposition: A | Discharge status: Home / Self Care | Payer: Medicare Other | Attending: Emergency Medicine | Admitting: Emergency Medicine

## 2018-12-27 ENCOUNTER — Encounter (HOSPITAL_COMMUNITY): Payer: Self-pay

## 2018-12-27 DIAGNOSIS — I10 Essential (primary) hypertension: Secondary | ICD-10-CM | POA: Diagnosis not present

## 2018-12-27 DIAGNOSIS — J189 Pneumonia, unspecified organism: Secondary | ICD-10-CM | POA: Diagnosis not present

## 2018-12-27 DIAGNOSIS — J181 Lobar pneumonia, unspecified organism: Secondary | ICD-10-CM

## 2018-12-27 DIAGNOSIS — F70 Mild intellectual disabilities: Secondary | ICD-10-CM | POA: Insufficient documentation

## 2018-12-27 DIAGNOSIS — Z79899 Other long term (current) drug therapy: Secondary | ICD-10-CM | POA: Diagnosis not present

## 2018-12-27 DIAGNOSIS — R05 Cough: Secondary | ICD-10-CM | POA: Diagnosis present

## 2018-12-27 LAB — COMPREHENSIVE METABOLIC PANEL
ALT: 12 U/L (ref 0–44)
AST: 50 U/L — AB (ref 15–41)
Albumin: 3.1 g/dL — ABNORMAL LOW (ref 3.5–5.0)
Alkaline Phosphatase: 40 U/L (ref 38–126)
Anion gap: 9 (ref 5–15)
BUN: 46 mg/dL — ABNORMAL HIGH (ref 6–20)
CO2: 29 mmol/L (ref 22–32)
Calcium: 8.3 mg/dL — ABNORMAL LOW (ref 8.9–10.3)
Chloride: 106 mmol/L (ref 98–111)
Creatinine, Ser: 1.31 mg/dL — ABNORMAL HIGH (ref 0.61–1.24)
GFR calc Af Amer: >60 mL/min (ref >60)
GFR calc non Af Amer: >60 mL/min (ref >60)
Glucose, Bld: 108 mg/dL — ABNORMAL HIGH (ref 70–99)
Potassium: 3.9 mmol/L (ref 3.5–5.1)
Sodium: 144 mmol/L (ref 135–145)
Total Bilirubin: 0.2 mg/dL — ABNORMAL LOW (ref 0.3–1.2)
Total Protein: 7.7 g/dL (ref 6.5–8.1)

## 2018-12-27 LAB — CBC WITH DIFFERENTIAL/PLATELET
Abs Immature Granulocytes: 0.04 10*3/uL (ref 0.00–0.07)
Basophils Absolute: 0 10*3/uL (ref 0.0–0.1)
Basophils Relative: 0 %
Eosinophils Absolute: 0 10*3/uL (ref 0.0–0.5)
Eosinophils Relative: 0 %
HEMATOCRIT: 31.7 % — AB (ref 39.0–52.0)
Hemoglobin: 9.9 g/dL — ABNORMAL LOW (ref 13.0–17.0)
Immature Granulocytes: 0 %
Lymphocytes Relative: 10 %
Lymphs Abs: 1 10*3/uL (ref 0.7–4.0)
MCH: 32.9 pg (ref 26.0–34.0)
MCHC: 31.2 g/dL (ref 30.0–36.0)
MCV: 105.3 fL — ABNORMAL HIGH (ref 80.0–100.0)
MONO ABS: 1.8 10*3/uL — AB (ref 0.1–1.0)
Monocytes Relative: 19 %
Neutro Abs: 6.7 10*3/uL (ref 1.7–7.7)
Neutrophils Relative %: 71 %
Platelets: 125 10*3/uL — ABNORMAL LOW (ref 150–400)
RBC: 3.01 MIL/uL — AB (ref 4.22–5.81)
RDW: 14.3 % (ref 11.5–15.5)
WBC: 9.5 10*3/uL (ref 4.0–10.5)
nRBC: 0 % (ref 0.0–0.2)

## 2018-12-27 LAB — URINALYSIS, ROUTINE W REFLEX MICROSCOPIC
Bilirubin Urine: NEGATIVE
Glucose, UA: NEGATIVE mg/dL
Ketones, ur: 20 mg/dL — AB
Leukocytes,Ua: NEGATIVE
Nitrite: NEGATIVE
Protein, ur: NEGATIVE mg/dL
Specific Gravity, Urine: 1.026 (ref 1.005–1.030)
pH: 5 (ref 5.0–8.0)

## 2018-12-27 LAB — LACTIC ACID, PLASMA
LACTIC ACID, VENOUS: 2.3 mmol/L — AB (ref 0.5–1.9)
Lactic Acid, Venous: 3.2 mmol/L (ref 0.5–1.9)
Lactic Acid, Venous: 1.1 mmol/L (ref 0.5–1.9)

## 2018-12-27 LAB — INFLUENZA PANEL BY PCR (TYPE A & B)
Influenza A By PCR: NEGATIVE
Influenza B By PCR: NEGATIVE

## 2018-12-27 LAB — I-STAT TROPONIN, ED: TROPONIN I, POC: 0.01 ng/mL (ref 0.00–0.08)

## 2018-12-27 MED ORDER — AMOXICILLIN-POT CLAVULANATE 875-125 MG PO TABS: 1.0000 | ORAL_TABLET | Freq: Two times a day (BID) | ORAL | 0 refills | Status: AC

## 2018-12-27 MED ORDER — GUAIFENESIN ER 600 MG PO TB12: 600.0000 mg | ORAL_TABLET | Freq: Two times a day (BID) | ORAL | 0 refills | Status: DC

## 2018-12-27 MED ORDER — SODIUM CHLORIDE 0.9 % IV BOLUS
1000.0000 mL | Freq: Once | INTRAVENOUS | Status: AC
  Administered 2018-12-27: 1000 mL via INTRAVENOUS

## 2018-12-27 MED ORDER — AMOXICILLIN-POT CLAVULANATE 875-125 MG PO TABS: 1.0000 | ORAL_TABLET | Freq: Two times a day (BID) | ORAL | 0 refills | Status: DC

## 2018-12-27 MED ORDER — SODIUM CHLORIDE (PF) 0.9 % IJ SOLN
INTRAMUSCULAR | Status: AC
  Filled 2018-12-27: qty 50

## 2018-12-27 MED ORDER — SODIUM CHLORIDE 0.9 % IV SOLN
500.0000 mg | INTRAVENOUS | Status: DC
  Administered 2018-12-27: 500 mg via INTRAVENOUS
  Filled 2018-12-27: qty 500

## 2018-12-27 MED ORDER — SODIUM CHLORIDE 0.9 % IV SOLN
2.0000 g | INTRAVENOUS | Status: DC
  Administered 2018-12-27: 2 g via INTRAVENOUS
  Filled 2018-12-27: qty 20

## 2018-12-27 MED ORDER — IOHEXOL 350 MG/ML SOLN
100.0000 mL | Freq: Once | INTRAVENOUS | Status: AC | PRN
  Administered 2018-12-27: 100 mL via INTRAVENOUS

## 2018-12-27 MED ORDER — GUAIFENESIN ER 600 MG PO TB12: 600.0000 mg | ORAL_TABLET | Freq: Two times a day (BID) | ORAL | 0 refills | Status: AC

## 2018-12-27 MED ORDER — ACETAMINOPHEN 325 MG PO TABS
650.0000 mg | ORAL_TABLET | Freq: Once | ORAL | Status: AC
  Administered 2018-12-27: 650 mg via ORAL
  Filled 2018-12-27: qty 2

## 2018-12-27 NOTE — Discharge Instructions (Addendum)
You were given a prescription for antibiotics. Please take the antibiotic prescription fully.   Please take mucinex as directed.   Please follow up with your primary care provider within 5-7 days for re-evaluation of your symptoms. Please return to the emergency department for any new or worsening symptoms.  It is also recommended that you stop taking your blood pressure medication for the next week since it may be causing some dehydration.  Follow-up with your primary care provider during this time.

## 2018-12-27 NOTE — ED Notes (Signed)
PO challenge- pt able to tolerate saltine crackers and ice water

## 2018-12-27 NOTE — ED Notes (Signed)
He is receiving IV fluids via the IV started by EMS at left A/C. I have been able to obtain all blood tests except the blood cultures. I have phoned main lab phlebotomy, who will be attempting these soon. His mom is with him and he (pt.) is in no distress.

## 2018-12-27 NOTE — Consult Note (Signed)
[facility] Initial Consultation Note   Patient Name: [name]    MEQ:[id number] PCP: Patient, No Pcp Per     DOB: Mar 19, 1976  DOA: 12/27/2018 DOS: the patient was seen and examined on 12/27/2018   Referring physician: Dr Della Goo Reason for consult: Pneumonia, evaluate for admission  HPI: [name] is a 43 y.o. male with Past medical history of childhood brain tumor, S/P left craniotomy with residual right hemiparesis and complex partial seizures, HTN. Patient is coming from home Patient was brought in from home.  History was obtained from patient as well as patient's sister who is at bedside and primary caregiver for the patient. Patient was at his baseline last night. He woke up this morning with severe fatigue as well as shortness of breath. He also has some cough. No fever no chills. No chest pain no abdominal pain. No nausea no vomiting no diarrhea reported. No change in medications reported as well. Patient is reportedly compliant with his medication. No seizures since last 7 years. No difficulty swallowing as well no sick contacts.  Review of Systems: as mentioned in the history of present illness.  All other systems reviewed and are negative.  Past Medical History:  Diagnosis Date  . Brain tumor (benign) ([place])   . Hypertension   . Mental retardation, mild (I.Q. 50-70)   . Seizures ([place])    Past Surgical History:  Procedure Laterality Date  . BRAIN SURGERY     Social History:  reports that he has never smoked. He has never used smokeless tobacco. He reports that he does not drink alcohol or use drugs.  No Known Allergies  Family History  Problem Relation Age of Onset  . Diabetes Mother   . Hypertension Mother   . Atrial fibrillation Mother   . Prostate cancer Father   . Multiple sclerosis Sister        Deceased  . Multiple sclerosis Sister        Deceased  . Hypertension Sister     Prior to Admission medications   Medication Sig Start  Date End Date Taking? Authorizing Provider  divalproex (DEPAKOTE) 500 MG DR tablet Take 2 tablets (1,000 mg total) by mouth 2 (two) times daily. 03/12/16  Yes Marcial Pacas, MD  olmesartan-hydrochlorothiazide (BENICAR HCT) 20-12.5 MG tablet Take 1 tablet by mouth daily. 12/23/18  Yes [provider]  diclofenac (VOLTAREN) 75 MG EC tablet Take 1 tablet (75 mg total) by mouth 2 (two) times daily. Patient not taking: Reported on 12/27/2018 02/05/17   Wallene Huh, [place]    Physical Exam: Vitals:   12/27/18 1230 12/27/18 1429 12/27/18 1500 12/27/18 1530  BP: 100/72  109/73 106/77  Pulse: 97 88 85   Resp: (!) 27 18 18 12   Temp:  98.2 F (36.8 C)    TempSrc:  Oral    SpO2: 100% 95% 97%   Weight:        General: Alert, Awake and Oriented to Time, Place and Person. Appear in mild distress, affect appropriate Eyes: PERRL, Conjunctiva normal ENT: Oral Mucosa clear mois. Neck: no JVD, no Abnormal Mass Or lumps Cardiovascular: S1 and S2 Present, no Murmur, Peripheral Pulses Present Respiratory: Bilateral Air entry equal and Decreased, no use of accessory muscle, left basal Crackles, no wheezes Abdomen: Bowel Sound present, Soft and no tenderness Skin: redness no, no Rash, no induration Extremities: no Pedal edema, no calf tenderness Neurologic: Grossly no focal neuro deficit. Bilaterally Equal motor strength  Labs:  CBC: Recent Labs  Lab 12/27/18 1022  WBC 9.5  NEUTROABS 6.7  HGB 9.9*  HCT 31.7*  MCV 105.3*  PLT 976*   Basic Metabolic Panel: Recent Labs  Lab 12/27/18 1022  NA 144  K 3.9  CL 106  CO2 29  GLUCOSE 108*  BUN 46*  CREATININE 1.31*  CALCIUM 8.3*   Liver Function Tests: Recent Labs  Lab 12/27/18 1022  AST 50*  ALT 12  ALKPHOS 40  BILITOT 0.2*  PROT 7.7  ALBUMIN 3.1*   No results for input(s): LIPASE, AMYLASE in the last 168 hours. No results for input(s): AMMONIA in the last 168 hours.  Cardiac Enzymes: No results for input(s): CKTOTAL, CKMB,  CKMBINDEX, TROPONINI in the last 168 hours. No results for input(s): PROBNP in the last 8760 hours.  CBG: No results for input(s): GLUCAP in the last 168 hours.  Radiological Exams: Ct Angio Chest Pe W And/or Wo Contrast  Result Date: 12/27/2018 CLINICAL DATA:  Hypoxia. Clinical concern for pulmonary embolism. EXAM: CT ANGIOGRAPHY CHEST WITH CONTRAST TECHNIQUE: Multidetector CT imaging of the chest was performed using the standard protocol during bolus administration of intravenous contrast. Multiplanar CT image reconstructions and MIPs were obtained to evaluate the vascular anatomy. CONTRAST:  161mL OMNIPAQUE IOHEXOL 350 MG/ML SOLN COMPARISON:  Portable chest obtained earlier today. Chest CTA dated 07/28/2017. FINDINGS: Cardiovascular: Satisfactory opacification of the pulmonary arteries to the segmental level. No evidence of pulmonary embolism. Normal heart size. No pericardial effusion. Minimal aortic atheromatous calcification. Mediastinum/Nodes: No enlarged mediastinal, hilar, or axillary lymph nodes. Thyroid gland, trachea, and esophagus demonstrate no significant findings. Lungs/Pleura: Mild bilateral dependent atelectasis. Additional patchy and confluent density in the inferior aspect of the left lower lobe. No pleural fluid. Upper Abdomen: Unremarkable. Musculoskeletal: Moderate dextroconvex scoliosis. Review of the MIP images confirms the above findings. IMPRESSION: 1. No evidence of pulmonary embolism. 2. Mild bilateral dependent atelectasis and additional patchy and confluent atelectasis or pneumonia in the left lower lobe. Aortic Atherosclerosis (ICD10-I70.0). Electronically Signed   By: Claudie Revering M.D.   On: 12/27/2018 13:17   Dg Chest Portable 1 View  Result Date: 12/27/2018 CLINICAL DATA:  Pt's mother reported the pt was breathing fast this morning and not acted like his normal self. Medical hx of HTN, brain tumor and mental retardation. cough EXAM: PORTABLE CHEST 1 VIEW COMPARISON:   Radiograph 07/28/2017 FINDINGS: Dextroscoliosis of the thoracic spine. VP shunt extends along the LEFT anterior chest wall medially. No effusion, infiltrate pneumothorax. No acute osseous findings. IMPRESSION: No acute cardiopulmonary process. Electronically Signed   By: Suzy Bouchard M.D.   On: 12/27/2018 10:46   Assessment/Plan 1.  Left lower lobe pneumonia. Presents with complaints of cough and shortness of breath.  Chest x-ray was negative. No leukocytosis. Patient reportedly was hypoxic at 70% with EMS. Currently the time of my evaluation for last 1 hour the patient is on room air and saturating around 98 200%. Does not appear in acute distress right now. CT scan of the chest was performed with PE protocol which is negative for pulmonary embolism or any other acute abnormality other than left lower lobe pneumonia. Lactic acid is mildly elevated. Patient was started on IV ceftriaxone and azithromycin. Patient does not have any known allergies to medication. Currently in the ER I would recommend to treat the patient with another IV normal saline bolus.  Also recommend to attempt a p.o. challenge to ensure the patient is able to take medication and diet orally. We  will recommend a repeat lactic acid and if the trend is down, At present would recommend patient to be treated outpatient.   I will recommend to treat with oral Augmentin 875 mg twice daily or 500 mg twice daily for total of 5 days. I will also treat along with Mucinex 600 mg twice daily for a total of 7 days. Recommend to use incentive spirometry at home. Follow-up with PCP in 1 week.  2.  Essential hypertension. Blood pressure soft. Patient is on Benicar and HCTZ at home. At present I would recommend to hold Benicar HCTZ medication for at least 1 week until seen by PCP and while the patient is on antibiotic.  3.  History of seizure disorder. Continue Depakote.  Family Communication: Patient sister was at bedside.  Who  is primary caregiver.  Opportunity was given to ask questions.  All questions answered satisfactorily. Primary team communication: Discussed with the EDP Dr. Francia Greaves as well as ED PA Cortni  Thank you very much for involving [place] in care of your patient.  We will sign off at present, please call us again as needed.   Author: Berle Mull, MD [facility] 12/27/2018 3:52 PM    If 7PM-7AM, please contact night-coverage [email or web address]

## 2018-12-27 NOTE — ED Notes (Signed)
Pt's family here and pt being transported in to family car.

## 2018-12-27 NOTE — ED Notes (Signed)
Date and time results received: 12/27/18 10:51 AM  Test: Lactic Acid Critical Value: 2.3  Name of Provider Notified: Courtni PA  Orders Received? Or Actions Taken?: Awaiting further orders

## 2018-12-27 NOTE — ED Provider Notes (Signed)
[facility] DEPT Provider Note   CSN: [id number] Arrival date & time: 12/27/18  6606    History   Chief Complaint Chief Complaint  Patient presents with  . Fever    Fever, lethargic, cough for >2 days    HPI [name] is a 43 y.o. male.     HPI   Patient is a 43 year old male with a history of brain tumor, hypertension, mild mental retardation, seizures, who presents the emergency department today for evaluation of a cough.  Patient states the cough is been present for at least a week.  He has been coughing up sputum.  He also reports feeling short of breath.  He does not have any chest pain.  He denies abdominal pain.  Denies diarrhea or constipation.  Denies urinary symptoms.  States he tried cough medication at home without significant relief.  Patient sister is at bedside and she assists with the history.  She confirms the above history and states that patient has been very fatigued lately and she thought he was breathing heavier this morning so she brought him to the emergency department.  Patient currently lives at home and has help from the family.  Past Medical History:  Diagnosis Date  . Brain tumor (benign) ([place])   . Hypertension   . Mental retardation, mild (I.Q. 50-70)   . Seizures [facility])     Patient Active Problem List   Diagnosis Date Noted  . Right leg weakness 04/03/2016  . Abnormality of gait 03/12/2016  . MENTAL RETARDATION, MILD 09/05/2007  . 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 HYPERTENSION 09/05/2007  . 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 HEMORRHAGE, INTRACEREBRAL 06/28/2007  . Complex partial seizure ([place]) 06/28/2007  . H/O craniotomy 06/28/2007  . INFECTION DUE TO NERVOUS SYSTEM DEVICE/GRAFT 06/22/2007    Past Surgical History:  Procedure Laterality Date  . BRAIN SURGERY          Home Medications    Prior to Admission medications   Medication Sig Start Date End Date  Taking? Authorizing Provider  divalproex (DEPAKOTE) 500 MG DR tablet Take 2 tablets (1,000 mg total) by mouth 2 (two) times daily. 03/12/16  Yes Marcial Pacas, MD  olmesartan-hydrochlorothiazide (BENICAR HCT) 20-12.5 MG tablet Take 1 tablet by mouth daily. 12/23/18  Yes [provider]  amoxicillin-clavulanate (AUGMENTIN) 875-125 MG tablet Take 1 tablet by mouth 2 (two) times daily for 5 days. 12/27/18 01/01/19  Felita Bump S, PA-C  diclofenac (VOLTAREN) 75 MG EC tablet Take 1 tablet (75 mg total) by mouth 2 (two) times daily. Patient not taking: Reported on 12/27/2018 02/05/17   Wallene Huh, DPM  guaiFENesin (MUCINEX) 600 MG 12 hr tablet Take 1 tablet (600 mg total) by mouth 2 (two) times daily for 5 days. 12/27/18 01/01/19  Ragina Fenter S, PA-C    Family History Family History  Problem Relation Age of Onset  . Diabetes Mother   . Hypertension Mother   . Atrial fibrillation Mother   . Prostate cancer Father   . Multiple sclerosis Sister        Deceased  . Multiple sclerosis Sister        Deceased  . Hypertension Sister     Social History Social History   Tobacco Use  . Smoking status: Never Smoker  . Smokeless tobacco: Never Used  Substance Use Topics  . Alcohol use: No  . Drug use: No     Allergies   Patient  has no known allergies.   Review of Systems Review of Systems  Constitutional: Positive for activity change and fever.  HENT: Positive for sneezing. Negative for congestion, ear pain, rhinorrhea and sore throat. Sinus pressure: resolved.   Eyes: Negative for visual disturbance.  Respiratory: Positive for cough and shortness of breath.   Cardiovascular: Negative for chest pain and leg swelling.  Gastrointestinal: Negative for abdominal pain, constipation, diarrhea, nausea and vomiting.  Genitourinary: Negative for dysuria and hematuria.  Musculoskeletal: Negative for back pain.  Skin: Negative for rash.  Neurological: Negative for headaches.  All other  systems reviewed and are negative.    Physical Exam Updated Vital Signs BP 106/77   Pulse 85   Temp 98.2 F (36.8 C) (Oral)   Resp 12   Wt 80 kg   SpO2 97%   BMI 23.27 kg/m   Physical Exam Vitals signs and nursing note reviewed.  Constitutional:      Appearance: He is well-developed.     Comments: Chronically ill-appearing.  HENT:     Head: Normocephalic and atraumatic.     Nose: No congestion or rhinorrhea.     Mouth/Throat:     Mouth: Mucous membranes are dry.  Eyes:     Conjunctiva/sclera: Conjunctivae normal.     Comments: Pupils are not symmetric.  Neck:     Musculoskeletal: Neck supple.  Cardiovascular:     Rate and Rhythm: Regular rhythm. Tachycardia present.     Heart sounds: No murmur.  Pulmonary:     Effort: Pulmonary effort is normal.     Breath sounds: Normal breath sounds.     Comments: Decreased breath sounds throughout.  No wheezing noted.  No obvious rales or rhonchi.  Patient has wet cough on exam. Abdominal:     General: Bowel sounds are normal.     Palpations: Abdomen is soft.     Tenderness: There is no abdominal tenderness. There is no rebound.  Musculoskeletal:        General: No tenderness.     Right lower leg: No edema.     Left lower leg: No edema.  Skin:    General: Skin is warm and dry.  Neurological:     Mental Status: He is alert.      ED Treatments / Results  Labs (all labs ordered are listed, but only abnormal results are displayed) Labs Reviewed  LACTIC ACID, PLASMA - Abnormal; Notable for the following components:      Result Value   Lactic Acid, Venous 2.3 (*)    All other components within normal limits  LACTIC ACID, PLASMA - Abnormal; Notable for the following components:   Lactic Acid, Venous 3.2 (*)    All other components within normal limits  COMPREHENSIVE METABOLIC PANEL - Abnormal; Notable for the following components:   Glucose, Bld 108 (*)    BUN 46 (*)    Creatinine, Ser 1.31 (*)    Calcium 8.3 (*)     Albumin 3.1 (*)    AST 50 (*)    Total Bilirubin 0.2 (*)    All other components within normal limits  CBC WITH DIFFERENTIAL/PLATELET - Abnormal; Notable for the following components:   RBC 3.01 (*)    Hemoglobin 9.9 (*)    HCT 31.7 (*)    MCV 105.3 (*)    Platelets 125 (*)    Monocytes Absolute 1.8 (*)    All other components within normal limits  URINALYSIS, ROUTINE W REFLEX MICROSCOPIC - Abnormal; Notable  for the following components:   APPearance HAZY (*)    Hgb urine dipstick SMALL (*)    Ketones, ur 20 (*)    Bacteria, UA RARE (*)    All other components within normal limits  CULTURE, BLOOD (ROUTINE X 2)  CULTURE, BLOOD (ROUTINE X 2)  INFLUENZA PANEL BY PCR (TYPE A & B)  I-STAT TROPONIN, ED    EKG EKG Interpretation  Date/Time:  Tuesday December 27 2018 09:49:21 EST Ventricular Rate:  100 PR Interval:    QRS Duration: 97 QT Interval:  306 QTC Calculation: 395 R Axis:   47 Text Interpretation:  Sinus tachycardia RSR' in V1 or V2, right VCD or RVH Abnormal T, consider ischemia, diffuse leads Confirmed by Dene Gentry [phone]) on 12/27/2018 12:50:52 PM Also confirmed by Dene Gentry [phone]), editor Philomena Doheny [phone])  on 12/27/2018 2:53:39 PM   Radiology Ct Angio Chest Pe W And/or Wo Contrast  Result Date: 12/27/2018 CLINICAL DATA:  Hypoxia. Clinical concern for pulmonary embolism. EXAM: CT ANGIOGRAPHY CHEST WITH CONTRAST TECHNIQUE: Multidetector CT imaging of the chest was performed using the standard protocol during bolus administration of intravenous contrast. Multiplanar CT image reconstructions and MIPs were obtained to evaluate the vascular anatomy. CONTRAST:  158mL OMNIPAQUE IOHEXOL 350 MG/ML SOLN COMPARISON:  Portable chest obtained earlier today. Chest CTA dated 07/28/2017. FINDINGS: Cardiovascular: Satisfactory opacification of the pulmonary arteries to the segmental level. No evidence of pulmonary embolism. Normal heart size. No pericardial effusion. Minimal aortic  atheromatous calcification. Mediastinum/Nodes: No enlarged mediastinal, hilar, or axillary lymph nodes. Thyroid gland, trachea, and esophagus demonstrate no significant findings. Lungs/Pleura: Mild bilateral dependent atelectasis. Additional patchy and confluent density in the inferior aspect of the left lower lobe. No pleural fluid. Upper Abdomen: Unremarkable. Musculoskeletal: Moderate dextroconvex scoliosis. Review of the MIP images confirms the above findings. IMPRESSION: 1. No evidence of pulmonary embolism. 2. Mild bilateral dependent atelectasis and additional patchy and confluent atelectasis or pneumonia in the left lower lobe. Aortic Atherosclerosis (ICD10-I70.0). Electronically Signed   By: Claudie Revering M.D.   On: 12/27/2018 13:17   Dg Chest Portable 1 View  Result Date: 12/27/2018 CLINICAL DATA:  Pt's mother reported the pt was breathing fast this morning and not acted like his normal self. Medical hx of HTN, brain tumor and mental retardation. cough EXAM: PORTABLE CHEST 1 VIEW COMPARISON:  Radiograph 07/28/2017 FINDINGS: Dextroscoliosis of the thoracic spine. VP shunt extends along the LEFT anterior chest wall medially. No effusion, infiltrate pneumothorax. No acute osseous findings. IMPRESSION: No acute cardiopulmonary process. Electronically Signed   By: Suzy Bouchard M.D.   On: 12/27/2018 10:46    Procedures Procedures (including critical care time)  Medications Ordered in ED Medications  cefTRIAXone (ROCEPHIN) 2 g in sodium chloride 0.9 % 100 mL IVPB (0 g Intravenous Stopped 12/27/18 1200)  azithromycin (ZITHROMAX) 500 mg in sodium chloride 0.9 % 250 mL IVPB (0 mg Intravenous Stopped 12/27/18 1305)  sodium chloride (PF) 0.9 % injection (has no administration in time range)  acetaminophen (TYLENOL) tablet 650 mg (650 mg Oral Given 12/27/18 1125)  sodium chloride 0.9 % bolus 1,000 mL (0 mLs Intravenous Stopped 12/27/18 1200)  iohexol (OMNIPAQUE) 350 MG/ML injection 100 mL (100 mLs  Intravenous Contrast Given 12/27/18 1244)  sodium chloride 0.9 % bolus 1,000 mL (1,000 mLs Intravenous New Bag/Given 12/27/18 1539)     Initial Impression / Assessment and Plan / ED Course  I have reviewed the triage vital signs and the nursing notes.  Pertinent labs &  imaging results that were available during my care of the patient were reviewed by me and considered in my medical decision making (see chart for details).        Final Clinical Impressions(s) / ED Diagnoses   Final diagnoses:  Community acquired pneumonia of left lower lobe of lung [facility])   Patient presents the emergency department today for evaluation of a cough that has been present for the last week.  On arrival his sats were noted to be 75% on room air by EMS.  He was initially placed on nonrebreather by EMS however in the ED he was placed on 4 L and has been maintaining his oxygen status at 97-100 % on room air.  He does feel improved with oxygen.  He is not tachypneic.  He is febrile with a temp of 100.8.  Blood pressure is within normal limits.  Heart rate was initially slightly elevated at 106.  Based on patient's vital signs and suspected source of infection being pneumonia, a code sepsis was called and patient was given abx to cover CAP. He was given initial 1L fluid bolus as his BP is WNL and lactic acid is below 4.   CBC shows no leukocytosis. hbg is lower than prior at 9.9 CMP shows elevated BUN and creatinine at 46 and 1.31 respectively.  No gross electrolyte derangement that would require intervention at this time.  AST is slightly elevated at 50, ALT is normal.  No elevated anion gap. Troponin is negative UA shows hematuria, ketonuria, no leukocytes or nitrites.  Rare bacteria noted.  Doubt UTI. Initial lactic acid is 2.3.  Repeat lactic acid was obtained 1 hour later prior to completion of fluids and was elevated at 3.2.  Will complete current fluid bolus and follow-up on repeat lactic acid. Influenza panel is  negative.  EKG Sinus tachycardia RSR' in V1 or V2, right VCD or RVH Abnormal T, consider ischemia, diffuse leads   Chest x-ray is negative  CTA of the chest was ordered to rule out PE or occult pneumonia, which shows no evidence of PE but does show LLL pneumonia  Pt was trialed without O2 and he maintained O2 sats at 94% on RA without any distress. His VS have improved and he is no longer tachycardic. Normal respirations and normal blood pressure have been maintained throughout his stay.  Case was discussed with Dr. Posey Pronto with hospitalist service who evaluated the pt at the bedside and feels that he is safe for discharge given that he is not hypoxic. He recommends giving additional fluid bolus and repeating lactic acid. If lactic acid is improving he feels that the patient does not require admission and would recommend discharging patient with augmentin x5 days, mucinex, incentive spirometry, and f/u with pcp in 1 week for re-evaluation. He spoke with the family at bedside who is comfortable with this plan.  Case was signed out to Georga Kaufmann, PA-C at shift change to f/u on above plan.  ED Discharge Orders         Ordered    amoxicillin-clavulanate (AUGMENTIN) 875-125 MG tablet  2 times daily     12/27/18 1557    guaiFENesin (MUCINEX) 600 MG 12 hr tablet  2 times daily     12/27/18 1557           Lenzy Kerschner S, PA-C 12/27/18 1559    Valarie Merino, MD 12/29/18 2024

## 2018-12-27 NOTE — ED Provider Notes (Signed)
[name] is a 43 y.o. male, presenting to the ED with a cough.   HPI from [employer], PA-C: "Patient is a 43 year old male with a history of brain tumor, hypertension, mild mental retardation, seizures, who presents the emergency department today for evaluation of a cough.  Patient states the cough is been present for at least a week.  He has been coughing up sputum.  He also reports feeling short of breath.  He does not have any chest pain.  He denies abdominal pain.  Denies diarrhea or constipation.  Denies urinary symptoms.  States he tried cough medication at home without significant relief.  Patient sister is at bedside and she assists with the history.  She confirms the above history and states that patient has been very fatigued lately and she thought he was breathing heavier this morning so she brought him to the emergency department.  Patient currently lives at home and has help from the family."  Past Medical History:  Diagnosis Date  . Brain tumor (benign) ([place])   . Hypertension   . Mental retardation, mild (I.Q. 50-70)   . Seizures ([facility])      Physical Exam  BP 106/77   Pulse 85   Temp 98.2 F (36.8 C) (Oral)   Resp 12   Wt 80 kg   SpO2 97%   BMI 23.27 kg/m   Physical Exam Vitals signs and nursing note reviewed.  Constitutional:      General: He is not in acute distress.    Appearance: He is well-developed. He is not diaphoretic.  HENT:     Head: Normocephalic and atraumatic.     Mouth/Throat:     Mouth: Mucous membranes are moist.     Pharynx: Oropharynx is clear.  Eyes:     Conjunctiva/sclera: Conjunctivae normal.  Neck:     Musculoskeletal: Neck supple.  Cardiovascular:     Rate and Rhythm: Normal rate and regular rhythm.     Pulses: Normal pulses.     Heart sounds: Normal heart sounds.     Comments: Tactile temperature in the extremities appropriate and equal bilaterally. Pulmonary:     Effort: Pulmonary effort is normal. No respiratory  distress.     Breath sounds: Normal breath sounds.     Comments: No increased work of breathing. Abdominal:     Palpations: Abdomen is soft.     Tenderness: There is no abdominal tenderness. There is no guarding.  Musculoskeletal:     Right lower leg: No edema.     Left lower leg: No edema.  Lymphadenopathy:     Cervical: No cervical adenopathy.  Skin:    General: Skin is warm and dry.  Neurological:     Mental Status: He is alert.  Psychiatric:        Mood and Affect: Mood and affect normal.        Speech: Speech normal.        Behavior: Behavior normal.     ED Course/Procedures     Procedures  Abnormal Labs Reviewed  LACTIC ACID, PLASMA - Abnormal; Notable for the following components:      Result Value   Lactic Acid, Venous 2.3 (*)    All other components within normal limits  LACTIC ACID, PLASMA - Abnormal; Notable for the following components:   Lactic Acid, Venous 3.2 (*)    All other components within normal limits  COMPREHENSIVE METABOLIC PANEL - Abnormal; Notable for the following components:   Glucose, Bld  108 (*)    BUN 46 (*)    Creatinine, Ser 1.31 (*)    Calcium 8.3 (*)    Albumin 3.1 (*)    AST 50 (*)    Total Bilirubin 0.2 (*)    All other components within normal limits  CBC WITH DIFFERENTIAL/PLATELET - Abnormal; Notable for the following components:   RBC 3.01 (*)    Hemoglobin 9.9 (*)    HCT 31.7 (*)    MCV 105.3 (*)    Platelets 125 (*)    Monocytes Absolute 1.8 (*)    All other components within normal limits  URINALYSIS, ROUTINE W REFLEX MICROSCOPIC - Abnormal; Notable for the following components:   APPearance HAZY (*)    Hgb urine dipstick SMALL (*)    Ketones, ur 20 (*)    Bacteria, UA RARE (*)    All other components within normal limits    Ct Angio Chest Pe W And/or Wo Contrast  Result Date: 12/27/2018 CLINICAL DATA:  Hypoxia. Clinical concern for pulmonary embolism. EXAM: CT ANGIOGRAPHY CHEST WITH CONTRAST TECHNIQUE:  Multidetector CT imaging of the chest was performed using the standard protocol during bolus administration of intravenous contrast. Multiplanar CT image reconstructions and MIPs were obtained to evaluate the vascular anatomy. CONTRAST:  156mL OMNIPAQUE IOHEXOL 350 MG/ML SOLN COMPARISON:  Portable chest obtained earlier today. Chest CTA dated 07/28/2017. FINDINGS: Cardiovascular: Satisfactory opacification of the pulmonary arteries to the segmental level. No evidence of pulmonary embolism. Normal heart size. No pericardial effusion. Minimal aortic atheromatous calcification. Mediastinum/Nodes: No enlarged mediastinal, hilar, or axillary lymph nodes. Thyroid gland, trachea, and esophagus demonstrate no significant findings. Lungs/Pleura: Mild bilateral dependent atelectasis. Additional patchy and confluent density in the inferior aspect of the left lower lobe. No pleural fluid. Upper Abdomen: Unremarkable. Musculoskeletal: Moderate dextroconvex scoliosis. Review of the MIP images confirms the above findings. IMPRESSION: 1. No evidence of pulmonary embolism. 2. Mild bilateral dependent atelectasis and additional patchy and confluent atelectasis or pneumonia in the left lower lobe. Aortic Atherosclerosis (ICD10-I70.0). Electronically Signed   By: Claudie Revering M.D.   On: 12/27/2018 13:17   Dg Chest Portable 1 View  Result Date: 12/27/2018 CLINICAL DATA:  Pt's mother reported the pt was breathing fast this morning and not acted like his normal self. Medical hx of HTN, brain tumor and mental retardation. cough EXAM: PORTABLE CHEST 1 VIEW COMPARISON:  Radiograph 07/28/2017 FINDINGS: Dextroscoliosis of the thoracic spine. VP shunt extends along the LEFT anterior chest wall medially. No effusion, infiltrate pneumothorax. No acute osseous findings. IMPRESSION: No acute cardiopulmonary process. Electronically Signed   By: Suzy Bouchard M.D.   On: 12/27/2018 10:46     MDM    Clinical Course as of Dec 28 51  Tue  Dec 27, 2018  1642 Patient states he feels good. No increased work of breathing. Lungs clear. Tolerating PO.  Proposed plan for discharge reviewed   [SJ]    Clinical Course User Index [SJ] Lorayne Bender, PA-C   Patient care handoff report received from [employer], [place]. Plan: Patient was seen by Dr. Posey Pronto, hospitalist, in consultation.  He recommended giving the patient another liter bolus, repeating the lactic acid, and if improving, discharged with Augmentin, Mucinex, and a spirometer.  He also recommends the patient hold his Benicar for 1 week to avoid dehydration.  Patient has pneumonia noted on CT.  Upon my evaluation, patient is nontoxic appearing, afebrile, not tachycardic, not tachypneic, not hypotensive, maintains excellent SPO2 on room air, and  is in no apparent distress.  He did have anemia noted today, however, it is not at a level that would require transfusion and he does not seem to be symptomatic to it. He will follow-up with his PCP for reevaluation in a few things, including his pneumonia and anemia.  Patient and family were given instructions for home care as well as return precautions. All parties voice understanding of these instructions, accept the plan, and are comfortable with discharge.   Vitals:   12/27/18 1230 12/27/18 1429 12/27/18 1500 12/27/18 1530  BP: 100/72  109/73 106/77  Pulse: 97 88 85   Resp: (!) 27 18 18 12   Temp:  98.2 F (36.8 C)    TempSrc:  Oral    SpO2: 100% 95% 97%   Weight:       Vitals:   12/27/18 1630 12/27/18 1800 12/27/18 1830 12/27/18 1930  BP: 105/75 102/69 110/81 113/68  Pulse: 88 84 90 86  Resp: 13 17 17 18   Temp:      TempSrc:      SpO2: 100% 90% 98% 100%  Weight:           Lorayne Bender, PA-C 12/28/18 0100    Daleen Bo, MD 12/28/18 1112

## 2018-12-27 NOTE — ED Notes (Addendum)
Pt and visitor verbalized discharge instructions and follow up care. Alert and leaving with family. Visitor's husband will be driving pt home. IV removed. Pt changed back into clothes by staff members and put into a brief per pt request

## 2018-12-27 NOTE — ED Notes (Signed)
Bed: WA14 Expected date:  Expected time:  Means of arrival:  Comments: EMS-fever 

## 2018-12-27 NOTE — ED Notes (Signed)
Pt taken off nasal cannula at this time. Current O2 sat is 94% on room air.

## 2019-01-01 LAB — CULTURE, BLOOD (ROUTINE X 2)
Culture: NO GROWTH
Culture: NO GROWTH

## 2019-07-24 ENCOUNTER — Inpatient Hospital Stay (HOSPITAL_COMMUNITY)
Admission: EM | Admit: 2019-07-24 | Discharge: 2019-08-02 | DRG: 640 | Disposition: A | Discharge status: Home Health | Payer: Medicare Other | Attending: Family Medicine | Admitting: Family Medicine

## 2019-07-24 ENCOUNTER — Emergency Department (HOSPITAL_COMMUNITY): Payer: Medicare Other

## 2019-07-24 ENCOUNTER — Other Ambulatory Visit: Payer: Self-pay

## 2019-07-24 DIAGNOSIS — L89012 Pressure ulcer of right elbow, stage 2: Secondary | ICD-10-CM | POA: Diagnosis present

## 2019-07-24 DIAGNOSIS — D649 Anemia, unspecified: Secondary | ICD-10-CM | POA: Diagnosis present

## 2019-07-24 DIAGNOSIS — R5081 Fever presenting with conditions classified elsewhere: Secondary | ICD-10-CM | POA: Diagnosis not present

## 2019-07-24 DIAGNOSIS — D496 Neoplasm of unspecified behavior of brain: Secondary | ICD-10-CM | POA: Diagnosis present

## 2019-07-24 DIAGNOSIS — M25561 Pain in right knee: Secondary | ICD-10-CM | POA: Diagnosis not present

## 2019-07-24 DIAGNOSIS — Z20828 Contact with and (suspected) exposure to other viral communicable diseases: Secondary | ICD-10-CM | POA: Diagnosis present

## 2019-07-24 DIAGNOSIS — I444 Left anterior fascicular block: Secondary | ICD-10-CM | POA: Diagnosis present

## 2019-07-24 DIAGNOSIS — Z79899 Other long term (current) drug therapy: Secondary | ICD-10-CM

## 2019-07-24 DIAGNOSIS — H547 Unspecified visual loss: Secondary | ICD-10-CM | POA: Diagnosis present

## 2019-07-24 DIAGNOSIS — L89226 Pressure-induced deep tissue damage of left hip: Secondary | ICD-10-CM | POA: Diagnosis present

## 2019-07-24 DIAGNOSIS — Z8679 Personal history of other diseases of the circulatory system: Secondary | ICD-10-CM

## 2019-07-24 DIAGNOSIS — T7601XA Adult neglect or abandonment, suspected, initial encounter: Secondary | ICD-10-CM | POA: Diagnosis present

## 2019-07-24 DIAGNOSIS — E876 Hypokalemia: Secondary | ICD-10-CM | POA: Diagnosis not present

## 2019-07-24 DIAGNOSIS — G9389 Other specified disorders of brain: Secondary | ICD-10-CM | POA: Diagnosis present

## 2019-07-24 DIAGNOSIS — R471 Dysarthria and anarthria: Secondary | ICD-10-CM | POA: Diagnosis present

## 2019-07-24 DIAGNOSIS — Z820 Family history of epilepsy and other diseases of the nervous system: Secondary | ICD-10-CM

## 2019-07-24 DIAGNOSIS — G40209 Localization-related (focal) (partial) symptomatic epilepsy and epileptic syndromes with complex partial seizures, not intractable, without status epilepticus: Secondary | ICD-10-CM | POA: Diagnosis present

## 2019-07-24 DIAGNOSIS — L89001 Pressure ulcer of unspecified elbow, stage 1: Secondary | ICD-10-CM | POA: Diagnosis not present

## 2019-07-24 DIAGNOSIS — Z681 Body mass index (BMI) 19 or less, adult: Secondary | ICD-10-CM | POA: Diagnosis not present

## 2019-07-24 DIAGNOSIS — Z833 Family history of diabetes mellitus: Secondary | ICD-10-CM

## 2019-07-24 DIAGNOSIS — L89152 Pressure ulcer of sacral region, stage 2: Secondary | ICD-10-CM | POA: Diagnosis present

## 2019-07-24 DIAGNOSIS — R64 Cachexia: Secondary | ICD-10-CM | POA: Diagnosis present

## 2019-07-24 DIAGNOSIS — L8990 Pressure ulcer of unspecified site, unspecified stage: Secondary | ICD-10-CM | POA: Insufficient documentation

## 2019-07-24 DIAGNOSIS — Z982 Presence of cerebrospinal fluid drainage device: Secondary | ICD-10-CM | POA: Diagnosis not present

## 2019-07-24 DIAGNOSIS — J189 Pneumonia, unspecified organism: Secondary | ICD-10-CM | POA: Diagnosis not present

## 2019-07-24 DIAGNOSIS — G911 Obstructive hydrocephalus: Secondary | ICD-10-CM | POA: Diagnosis present

## 2019-07-24 DIAGNOSIS — R509 Fever, unspecified: Secondary | ICD-10-CM | POA: Diagnosis not present

## 2019-07-24 DIAGNOSIS — E860 Dehydration: Secondary | ICD-10-CM | POA: Diagnosis present

## 2019-07-24 DIAGNOSIS — E870 Hyperosmolality and hypernatremia: Principal | ICD-10-CM | POA: Diagnosis present

## 2019-07-24 DIAGNOSIS — R630 Anorexia: Secondary | ICD-10-CM | POA: Diagnosis present

## 2019-07-24 DIAGNOSIS — F70 Mild intellectual disabilities: Secondary | ICD-10-CM | POA: Diagnosis present

## 2019-07-24 DIAGNOSIS — N179 Acute kidney failure, unspecified: Secondary | ICD-10-CM | POA: Diagnosis present

## 2019-07-24 DIAGNOSIS — Z86011 Personal history of benign neoplasm of the brain: Secondary | ICD-10-CM

## 2019-07-24 DIAGNOSIS — Z8249 Family history of ischemic heart disease and other diseases of the circulatory system: Secondary | ICD-10-CM

## 2019-07-24 DIAGNOSIS — R9389 Abnormal findings on diagnostic imaging of other specified body structures: Secondary | ICD-10-CM | POA: Diagnosis not present

## 2019-07-24 DIAGNOSIS — R1312 Dysphagia, oropharyngeal phase: Secondary | ICD-10-CM | POA: Diagnosis present

## 2019-07-24 DIAGNOSIS — I10 Essential (primary) hypertension: Secondary | ICD-10-CM | POA: Diagnosis present

## 2019-07-24 DIAGNOSIS — R627 Adult failure to thrive: Secondary | ICD-10-CM | POA: Diagnosis present

## 2019-07-24 DIAGNOSIS — R7401 Elevation of levels of liver transaminase levels: Secondary | ICD-10-CM | POA: Diagnosis not present

## 2019-07-24 LAB — TSH: TSH: 0.653 u[IU]/mL (ref 0.350–4.500)

## 2019-07-24 LAB — CBC WITH DIFFERENTIAL/PLATELET
Abs Immature Granulocytes: 0.02 10*3/uL (ref 0.00–0.07)
Basophils Absolute: 0 10*3/uL (ref 0.0–0.1)
Basophils Relative: 0 %
Eosinophils Absolute: 0 10*3/uL (ref 0.0–0.5)
Eosinophils Relative: 0 %
HCT: 42.5 % (ref 39.0–52.0)
Hemoglobin: 12.7 g/dL — ABNORMAL LOW (ref 13.0–17.0)
Immature Granulocytes: 0 %
Lymphocytes Relative: 27 %
Lymphs Abs: 1.7 10*3/uL (ref 0.7–4.0)
MCH: 31.4 pg (ref 26.0–34.0)
MCHC: 29.9 g/dL — ABNORMAL LOW (ref 30.0–36.0)
MCV: 105.2 fL — ABNORMAL HIGH (ref 80.0–100.0)
Monocytes Absolute: 0.6 10*3/uL (ref 0.1–1.0)
Monocytes Relative: 9 %
Neutro Abs: 4.1 10*3/uL (ref 1.7–7.7)
Neutrophils Relative %: 64 %
Platelets: 139 10*3/uL — ABNORMAL LOW (ref 150–400)
RBC: 4.04 MIL/uL — ABNORMAL LOW (ref 4.22–5.81)
RDW: 16.8 % — ABNORMAL HIGH (ref 11.5–15.5)
WBC: 6.4 10*3/uL (ref 4.0–10.5)
nRBC: 1.1 % — ABNORMAL HIGH (ref 0.0–0.2)

## 2019-07-24 LAB — URINALYSIS, ROUTINE W REFLEX MICROSCOPIC
Bilirubin Urine: NEGATIVE
Glucose, UA: NEGATIVE mg/dL
Ketones, ur: 5 mg/dL — AB
Nitrite: NEGATIVE
Protein, ur: 30 mg/dL — AB
Specific Gravity, Urine: 1.028 (ref 1.005–1.030)
WBC, UA: >50 WBC/hpf — ABNORMAL HIGH (ref 0–5)
pH: 5 (ref 5.0–8.0)

## 2019-07-24 LAB — BASIC METABOLIC PANEL
BUN: 74 mg/dL — ABNORMAL HIGH (ref 6–20)
BUN: 77 mg/dL — ABNORMAL HIGH (ref 6–20)
CO2: 32 mmol/L (ref 22–32)
CO2: 28 mmol/L (ref 22–32)
Calcium: 8.1 mg/dL — ABNORMAL LOW (ref 8.9–10.3)
Calcium: 7.9 mg/dL — ABNORMAL LOW (ref 8.9–10.3)
Chloride: >130 mmol/L (ref 98–111)
Chloride: >130 mmol/L (ref 98–111)
Creatinine, Ser: 1.9 mg/dL — ABNORMAL HIGH (ref 0.61–1.24)
Creatinine, Ser: 1.7 mg/dL — ABNORMAL HIGH (ref 0.61–1.24)
GFR calc Af Amer: 49 mL/min — ABNORMAL LOW (ref >60)
GFR calc Af Amer: 56 mL/min — ABNORMAL LOW (ref >60)
GFR calc non Af Amer: 43 mL/min — ABNORMAL LOW (ref >60)
GFR calc non Af Amer: 49 mL/min — ABNORMAL LOW (ref >60)
Glucose, Bld: 102 mg/dL — ABNORMAL HIGH (ref 70–99)
Glucose, Bld: 101 mg/dL — ABNORMAL HIGH (ref 70–99)
Potassium: 3.4 mmol/L — ABNORMAL LOW (ref 3.5–5.1)
Potassium: 3.7 mmol/L (ref 3.5–5.1)
Sodium: 170 mmol/L (ref 135–145)
Sodium: 169 mmol/L (ref 135–145)

## 2019-07-24 LAB — COMPREHENSIVE METABOLIC PANEL
ALT: 23 U/L (ref 0–44)
AST: 73 U/L — ABNORMAL HIGH (ref 15–41)
Albumin: 3.3 g/dL — ABNORMAL LOW (ref 3.5–5.0)
Alkaline Phosphatase: 47 U/L (ref 38–126)
Anion gap: 13 (ref 5–15)
BUN: 81 mg/dL — ABNORMAL HIGH (ref 6–20)
CO2: 31 mmol/L (ref 22–32)
Calcium: 9.2 mg/dL (ref 8.9–10.3)
Chloride: 127 mmol/L — ABNORMAL HIGH (ref 98–111)
Creatinine, Ser: 2.5 mg/dL — ABNORMAL HIGH (ref 0.61–1.24)
GFR calc Af Amer: 35 mL/min — ABNORMAL LOW (ref >60)
GFR calc non Af Amer: 31 mL/min — ABNORMAL LOW (ref >60)
Glucose, Bld: 116 mg/dL — ABNORMAL HIGH (ref 70–99)
Potassium: 4 mmol/L (ref 3.5–5.1)
Sodium: 171 mmol/L (ref 135–145)
Total Bilirubin: 0.6 mg/dL (ref 0.3–1.2)
Total Protein: 7.7 g/dL (ref 6.5–8.1)

## 2019-07-24 LAB — SODIUM: Sodium: 171 mmol/L (ref 135–145)

## 2019-07-24 LAB — SODIUM, URINE, RANDOM: Sodium, Ur: 57 mmol/L

## 2019-07-24 LAB — VITAMIN B12: Vitamin B-12: 749 pg/mL (ref 180–914)

## 2019-07-24 LAB — LACTIC ACID, PLASMA
Lactic Acid, Venous: 2.2 mmol/L (ref 0.5–1.9)
Lactic Acid, Venous: 1.6 mmol/L (ref 0.5–1.9)

## 2019-07-24 LAB — PROTIME-INR
INR: 1.2 (ref 0.8–1.2)
Prothrombin Time: 15.1 seconds (ref 11.4–15.2)

## 2019-07-24 LAB — OSMOLALITY: Osmolality: 384 mOsm/kg (ref 275–295)

## 2019-07-24 LAB — SARS CORONAVIRUS 2 BY RT PCR (HOSPITAL ORDER, PERFORMED IN ~~LOC~~ HOSPITAL LAB): SARS Coronavirus 2: NEGATIVE

## 2019-07-24 LAB — APTT: aPTT: 32 seconds (ref 24–36)

## 2019-07-24 MED ORDER — ENOXAPARIN SODIUM 40 MG/0.4ML ~~LOC~~ SOLN
40.0000 mg | Freq: Every day | SUBCUTANEOUS | Status: DC
  Administered 2019-07-25 – 2019-08-02 (×9): 40 mg via SUBCUTANEOUS
  Filled 2019-07-24 (×8): qty 0.4

## 2019-07-24 MED ORDER — SODIUM CHLORIDE 0.9 % IV BOLUS
1000.0000 mL | Freq: Once | INTRAVENOUS | Status: AC
  Administered 2019-07-24: 13:00:00 1000 mL via INTRAVENOUS

## 2019-07-24 MED ORDER — VALPROATE SODIUM 500 MG/5ML IV SOLN
750.0000 mg | Freq: Three times a day (TID) | INTRAVENOUS | Status: DC
  Administered 2019-07-25 (×2): 750 mg via INTRAVENOUS
  Filled 2019-07-24 (×4): qty 7.5

## 2019-07-24 MED ORDER — DIVALPROEX SODIUM 250 MG PO DR TAB: 1000.0000 mg | DELAYED_RELEASE_TABLET | Freq: Two times a day (BID) | ORAL | Status: DC

## 2019-07-24 MED ORDER — SODIUM CHLORIDE 0.9% FLUSH: 10.0000 mL | INTRAVENOUS | Status: DC | PRN

## 2019-07-24 MED ORDER — SODIUM CHLORIDE 0.9 % IV SOLN
INTRAVENOUS | Status: DC
  Administered 2019-07-24 – 2019-07-25 (×3): via INTRAVENOUS

## 2019-07-24 MED ORDER — SODIUM CHLORIDE 0.9% FLUSH
10.0000 mL | Freq: Two times a day (BID) | INTRAVENOUS | Status: DC
  Administered 2019-07-25 – 2019-08-02 (×7): 10 mL

## 2019-07-24 NOTE — ED Notes (Signed)
IV team at bedside 

## 2019-07-24 NOTE — H&P (Addendum)
[facility] Admission History and Physical Service Pager: [phone]  Patient name: [name] Medical record number: [id number] Date of birth: 29-Aug-1976 Age: 43 y.o. Gender: male  Primary Care Provider: Patient, No Pcp Per Consultants: none Code Status: FULL Preferred Emergency Contact: Janett Billow, patient's sister, at [phone]  Chief Complaint: Failure to thrive  Assessment and Plan: [name] is a 43 y.o. male presenting with hypernatremia, anorexia. PMH is significant for brain tumor, hypertension, intellectual disability, seizures, and visual impairment.  Hypernatremia  Dehydration  Failure to Thrive Report from caregiver includes ~10 days of decreased thirst and hunger.  His sister reports that in the last 2 years patient has gradually declined, with steadily worsening mentation and steady weight loss.  In 2018 patient weighed 68 kg, which reveals an overall weight loss of about 30 pounds in the interim.  Patient states that he frequently is not hungry, but he cannot explain why that is.  He denies any acid reflux, pain with eating, nausea, and vomiting.  He has no prior complaints with moving his bowels, certainly no diarrhea, last BM was normal per his sister's report and on Thursday.  Patient's sister reports one episode of brief choking that quickly resolved several weeks ago.  His sister corroborates patient's entire history.  Patient has a history of brain tumor as a child that had multiple surgeries.  He has a VP shunt in place, no signs of infection or swelling. Patient appears cachectic with a scaphoid abdomen, pale with dry mucous membranes.  He has multiple areas of skin breakdown and tears to right elbow, right hip, and beginning stages of skin breakdown to sacral region.  None of his sites of skin breakdown appear infected, no heat, no swelling.  Report from ED provider revealed that a beetle fell out of his clothes on exam.  The  combination of all these factors indicates a much more chronic decline than several days without p.o. intake.  Labs reveal hypernatremia to 171, repeat also at 171, creatinine 2.50 (baseline 1.5).  CBC and CMP otherwise in normal limits, no signs of infection, afebrile, and vital signs stable.  Lactic acid 2.2, most likely due to dehydration, with calculated 7.5L of fluid deficit.  UA more indicative for dehydration than UTI: Nitrites negative, leukocytes large, ketones 5, hemoglobin small, patient also denies any urinary complaints.  Patient is s/p 2 L normal saline in the ED, both ED provider and sister report that he perked up after fluid resuscitation.  Consequently, differential diagnosis remains quite broad at this time, considering possible VP shunt malfunction, malignancy, DI, SIADH, thyroid disorders, neglect. We will continue with fluid resuscitation, 200 mL/h NS, with frequent BMP checks.  Obtain a CT head at this time to assess VP shunt, any other explanations of abnormalities that may be occurring.  We will consider MRI brain if necessary after CT.  Will consider referral to neurology, neurosurgery depending on results from imaging.  We will consult speech to assess for dysphagia, he will be n.p.o. until assessed for dysphagia.  When we do reinstitute a diet, we will obtain refeeding labs..  Depending on initial work-up and imaging, can consider possible GI source of anorexia, may consider consulting GI. - Admit to FPTS, med-surg, attending Dr. Ardelia Mems - IVF @ 200 mL/hr NS - Consult SLP - NPO until SLP assessment  - Obtain refeeding labs when diet reinstated - CT head, pending results may consider MRI brain - Will consider Neurology referral, neurosurgery if problem  with VP shunt  - PT/OT - Q4H BMP - AM CBC, BMP, TSH - Check mg, phos for refeeding syndrome - Lovenox for DVT ppx - consult PT, OT, nutrition - follow up urine sodium, urine osmolality, serum osmolality  Pressure ulcers To  right elbow, right hip, sacrum.  Very superficial, beginning of stage II.  Bandages applied.  Will consult wound nursing. - Consult wound care  AKI On admission 2.50.  Previous labs revealed baseline around 1.5 approx. six months ago.  - Continue fluid resuscitation - Daily BMP  HTN Home meds include Benicar. -We will monitor, hold home meds for now  Seizures Home medication includes Depakote.  Seizures are sometimes tonic-clonic and sometimes absence in nature.  His last seizure was approximately 9 years ago, he has not been to see neurology in 3 years. -Continue home medications when able to take PO  FEN/GI: N.p.o. until SLP evaluation Prophylaxis: Lovenox  Disposition: Patient requires inpatient medical care for hypernatremia, further work-up of underlying disorder  History of Present Illness:  [name] is a 43 y.o. male presenting with hypernatremia, dehydration, and altered mental status.  His sister reports that he has had poor PO intake over the past four days.  She says that he normally likes to drink sodas and juice, and she bought two bottles of Gatorade on Friday and says he drank those.  He denies fevers, chills, runny nose, cough, dysuria, or changes in his bowel function.  He lives with his mother, sister, and two sons (ages 65 and 36).  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  He has always had some cognitive impairment but acutely worsened one week ago.  Sometimes, he would not respond when his name was called at home and appearing more fatigued.  He was breathing more heavily over the past week as well.  He has gradually lost weight and weighed 68.5 kg in 2017.  His sister and brother in law feed him usually and he has eaten about 75% of his food, but over the last week and a half, he has been eating only about 15% of his meals.  He has choked once  recently but denies any difficulty swallowing.  He cannot identify a reason for his poor appetite and denies abdominal pain.  He lost the ability to walk about one year ago and now cannot stand on his own.  Maudie Flakes at [address] is his primary provider.  His mother is his decision maker but is having memory difficulty, so his sister, Janett Billow, at [id number] is making decisions for him now.  Review Of Systems: Per HPI with the following additions:  Review of Systems  Constitutional: Negative for chills and fever.  HENT: Negative for congestion.   Respiratory: Negative for cough.   Gastrointestinal: Negative for constipation, diarrhea, nausea and vomiting.  Genitourinary: Negative for dysuria.    Patient Active Problem List   Diagnosis Date Noted  . Right leg weakness 04/03/2016  . Abnormality of gait 03/12/2016  . MENTAL RETARDATION, MILD 09/05/2007  . 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 HYPERTENSION 09/05/2007  . 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 HEMORRHAGE, INTRACEREBRAL 06/28/2007  . Complex partial seizure ([place]) 06/28/2007  . H/O craniotomy 06/28/2007  . INFECTION DUE TO NERVOUS SYSTEM DEVICE/GRAFT 06/22/2007    Past Medical History: Past  Medical History:  Diagnosis Date  . Brain tumor (benign) ([place])   . Hypertension   . Mental retardation, mild (I.Q. 50-70)   . Seizures ([place])     Past Surgical History: Past Surgical History:  Procedure Laterality Date  . BRAIN SURGERY      Social History: Social History   Tobacco Use  . Smoking status: Never Smoker  . Smokeless tobacco: Never Used  Substance Use Topics  . Alcohol use: No  . Drug use: No   Additional social history: Patient lives with his sister, her husband, his 2 children who are teens and 67s, and his mother, who has dementia.  Family History: Family History  Problem Relation Age of Onset  . Diabetes Mother   . Hypertension Mother   . Atrial  fibrillation Mother   . Prostate cancer Father   . Multiple sclerosis Sister        Deceased  . Multiple sclerosis Sister        Deceased  . Hypertension Sister     Allergies and Medications: No Known Allergies No current facility-administered medications on file prior to encounter.    Current Outpatient Medications on File Prior to Encounter  Medication Sig Dispense Refill  . divalproex (DEPAKOTE) 500 MG DR tablet Take 2 tablets (1,000 mg total) by mouth 2 (two) times daily. 360 tablet 4  . olmesartan-hydrochlorothiazide (BENICAR HCT) 20-12.5 MG tablet Take 1 tablet by mouth daily.    . diclofenac (VOLTAREN) 75 MG EC tablet Take 1 tablet (75 mg total) by mouth 2 (two) times daily. (Patient not taking: Reported on 12/27/2018) 50 tablet 2    Objective: BP 91/64   Pulse 88   Temp 98.6 F (37 C) (Rectal)   Resp (!) 9   Ht 5\' 11"  (1.803 m)   Wt 54.4 kg   SpO2 100%   BMI 16.74 kg/m  Exam: General: Cachectic, pale, ill-appearing man lying in bed, NAD Eyes: Sunken appearance, anicteric ENTM: Dry mucous membranes, clear oropharynx Neck: Supple, no LAD Cardiovascular: RRR, no M/R/G Respiratory: Clear to auscultation bilaterally Gastrointestinal: Scaphoid appearance, soft, nontender, nondistended, normal bowel sounds present MSK: Globally weak, difficulty participating in exam Derm: Stage II skin breakdown present at right elbow, right hip, sacrum Neuro: Patient has intellectual disability at baseline, he can speak and engage in conversation, he does clearly follow most of conversation, however his speech is difficult to decipher, he is globally weak and has difficulty participating in exam Psych: Patient is not anxious, states that he is in a good mood  Labs and Imaging: CBC BMET  Recent Labs  Lab 07/24/19 1014  WBC 6.4  HGB 12.7*  HCT 42.5  PLT 139*   Recent Labs  Lab 07/24/19 1014  NA 171*  K 4.0  CL 127*  CO2 31  BUN 81*  CREATININE 2.50*  GLUCOSE 116*  CALCIUM  9.2     EKG: Sinus tachycardia Pulmonary disease pattern Left anterior fascicular block ST & T wave abnormality, consider anterolateral ischemia Abnormal ECG No significant change since last tracing Confirmed by Isla Pence [phone])  Ct Head Wo Contrast  Result Date: 07/24/2019 CLINICAL DATA:  Altered level of consciousness EXAM: CT HEAD WITHOUT CONTRAST TECHNIQUE: Contiguous axial images were obtained from the base of the skull through the vertex without intravenous contrast. 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 There has been interval increase  in caliber of the lateral and third ventricles when compared to the prior examination dated 07/28/2017. Incidental note of cavum septum pellucidum variant of the lateral ventricles. Vascular: No hyperdense vessel or unexpected calcification. Skull: Left hemicraniectomy.  Negative for fracture or focal lesion. Sinuses/Orbits: No acute finding. Other: None. IMPRESSION: 1. 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 There has been interval increase in caliber of the lateral and third ventricles when compared to the prior examination dated 07/28/2017, concerning for shunt catheter malfunction. No obvious obstructing lesion along the aqueduct or within the fourth ventricle. Chronic global cerebral volume loss and/or acute fluid status may exaggerate this appearance given time interval from prior examination. 2.  Status post left hemicraniectomy. Electronically Signed   By: Eddie Candle M.D.   On: 07/24/2019 16:28   Dg Chest Port 1 View  Result Date: 07/24/2019 CLINICAL DATA:  Hypotension EXAM: PORTABLE CHEST 1 VIEW COMPARISON:  December 27, 2018. FINDINGS: No edema or  consolidation. Heart size and pulmonary vascularity are normal. No adenopathy. Shunt extends along the left hemithorax. There is lower thoracic dextroscoliosis with thoracolumbar levoscoliosis. IMPRESSION: No edema or consolidation. Stable cardiac silhouette. Scoliosis, grossly stable in appearance. Electronically Signed   By: Lowella Grip III M.D.   On: 07/24/2019 11:23    Gladys Damme, MD 07/24/2019, 3:11 PM PGY-1, [place] Intern pager: [phone], text pages welcome  FPTS Upper-Level Resident Addendum   I have independently interviewed and examined the patient. I have discussed the above with the original author and agree with their documentation. My edits for correction/addition/clarification are in blue. Please see also any attending notes.    Kathrene Alu, MD PGY-3, [facility] 07/24/2019 7:02 PM  [facility] Service pager: [phone] (text pages welcome through [place])

## 2019-07-24 NOTE — ED Notes (Signed)
Patient transported to CT 

## 2019-07-24 NOTE — ED Notes (Signed)
Patient denies pain and is resting comfortably.  

## 2019-07-24 NOTE — ED Notes (Signed)
Critical values reported to attending physician. Pt to be upgraded to progressive.

## 2019-07-24 NOTE — ED Triage Notes (Signed)
Per EMS- pt here from home where he lives with mother. Pt bed bound at  Baseline. Mother reports failure to thrive. Hypotensive with EMS.

## 2019-07-24 NOTE — ED Provider Notes (Signed)
[facility] EMERGENCY DEPARTMENT Provider Note   CSN: [id number] Arrival date & time: 07/24/19  1000     History   Chief Complaint Failure to thrive  HPI [name] is a 43 y.o. male.  [name] is a 43 y.o. male with past medical history significant for brain tumor, hypertension, mental retardation, seizures, visual impairment presents for evaluation of failure to thrive.  Per EMS family states patient has not wanted to eat and drink over the last 4 days. Family states he has been fatigued more so he normally is. Per family no fever, cough, abdominal pain, malodorous urine, diarrhea, constipation.  Patient voices no pain.  Caregiver in room provides majority of history as well as past medical records. No interpretor was used.  Level 5 caveat- Mental retardation, cranial surgeries     HPI  Past Medical History:  Diagnosis Date   Brain tumor (benign) ([place])    Hypertension    Mental retardation, mild (I.Q. 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 Authorizing Provider  divalproex (DEPAKOTE) 500 MG DR tablet Take 2 tablets (1,000 mg total) by mouth 2 (two) times daily. 03/12/16  Yes Marcial Pacas, MD  olmesartan-hydrochlorothiazide (BENICAR HCT) 20-12.5 MG tablet Take 1 tablet by  mouth daily. 12/23/18  Yes [provider]  diclofenac (VOLTAREN) 75 MG EC tablet Take 1 tablet (75 mg total) by mouth 2 (two) times daily. Patient not taking: Reported on 12/27/2018 02/05/17   Wallene Huh, DPM    Family History Family History  Problem Relation Age of Onset   Diabetes Mother    Hypertension Mother    Atrial fibrillation Mother    Prostate cancer Father    Multiple sclerosis Sister        Deceased   Multiple sclerosis Sister        Deceased   Hypertension Sister     Social History Social History   Tobacco Use   Smoking status: Never Smoker   Smokeless tobacco: Never Used  Substance Use Topics   Alcohol use: No   Drug use: No     Allergies   Patient has no known allergies.   Review of Systems Review of Systems  Unable to perform ROS: Other  Constitutional: Positive for activity change and appetite change.  HENT: Negative.   Respiratory: Negative.   Cardiovascular: Negative.   Gastrointestinal: Negative.   Genitourinary: Negative.   Musculoskeletal: Negative.   Skin: Positive for wound.  Neurological: Negative.   All other systems reviewed and are negative.  Physical Exam Updated Vital Signs BP 102/63    Pulse 87    Temp 98.6 F (37 C) (Rectal)    Resp 12    Ht 5\' 11"  (1.803 m)    Wt 54.4 kg  SpO2 98%    BMI 16.74 kg/m   Physical Exam  Physical Exam Vitals signs and nursing note reviewed.  Constitutional:      General: He is not in acute distress.    Appearance: He is well-developed. He is ill-appearing. He is not diaphoretic.     Comments: Chronically ill-appearing, cachectic.  HENT:     Head:     Comments: Visible scars to scalp post surgical resection on left cradium    Nose:     Comments: Dried rhinorrhea to bilateral nares.    Mouth/Throat:     Comments: Mucous membranes dry.  Crusting and mucous membranes, white scrappable material to the inside of mouth. Eyes:     Pupils: Pupils are equal, round, and  reactive to light.  Neck:     Musculoskeletal: Full passive range of motion without pain, normal range of motion and neck supple.     Comments: Moves neck without difficulty. Cardiovascular:     Rate and Rhythm: Normal rate and regular rhythm.     Pulses: Normal pulses.          Radial pulses are 2+ on the right side and 2+ on the left side.       Dorsalis pedis pulses are 2+ on the right side and 2+ on the left side.       Posterior tibial pulses are 2+ on the right side and 2+ on the left side.     Heart sounds: Normal heart sounds.     Comments: Tachycardic to 101 Pulmonary:     Effort: Pulmonary effort is normal. No respiratory distress.     Breath sounds: Normal breath sounds and air entry.     Comments: Clear to auscultation bilaterally without wheeze, rhonchi or rales. Chest:     Comments: No crepitus, edema or step-offs. Abdominal:     General: Abdomen is scaphoid. Bowel sounds are normal. There is no distension.     Palpations: Abdomen is soft.     Tenderness: There is no abdominal tenderness.     Comments: Normoactive bowel sounds.  No focal tenderness Scaphoid abdomen. Old, well-healed surgical incisions without evidence of overlying infection.  GU: chaperone in room: Dried feces to inner groin and penis, Normal rectal tone with light brown stool Musculoskeletal: Normal range of motion.     Comments: Contracture to right upper extremity.  Moves bilateral lower extremities however with weakness.  Wiggles toes.  Patient with skin tear, beginning stages of pressure ulcer to right elbow.  Patient with large skin tear and breakdown to right posterior hip.  Beginning stages of breakdown to posterior lower sacral area.  Feet:     Right foot:     Skin integrity: Dry skin present.     Toenail Condition: Right toenails are abnormally thick and long.     Left foot:     Skin integrity: Dry skin present.     Toenail Condition: Left toenails are abnormally thick and long.     Comments:  Dried, thickened skin to bilateral feet. Skin:    General: Skin is warm and dry.     Comments: Skin break down to multiple areas consistent with skin tears, beginning stages of pressure ulcers.  Cap refill greater than 3 seconds.  Patient with pallor.  Neurological:     Mental Status: He is alert.     Comments: Follows commands.  No facial droop.  Unable to perform heel-to-shin, finger-to-nose secondary to weakness.  Patient with generalized weakness  throughout.       ED Treatments / Results  Labs (all labs ordered are listed, but only abnormal results are displayed) Labs Reviewed  LACTIC ACID, PLASMA - Abnormal; Notable for the following components:      Result Value   Lactic Acid, Venous 2.2 (*)    All other components within normal limits  COMPREHENSIVE METABOLIC PANEL - Abnormal; Notable for the following components:   Sodium 171 (*)    Chloride 127 (*)    Glucose, Bld 116 (*)    BUN 81 (*)    Creatinine, Ser 2.50 (*)    Albumin 3.3 (*)    AST 73 (*)    GFR calc non Af Amer 31 (*)    GFR calc Af Amer 35 (*)    All other components within normal limits  CBC WITH DIFFERENTIAL/PLATELET - Abnormal; Notable for the following components:   RBC 4.04 (*)    Hemoglobin 12.7 (*)    MCV 105.2 (*)    MCHC 29.9 (*)    RDW 16.8 (*)    Platelets 139 (*)    nRBC 1.1 (*)    All other components within normal limits  URINALYSIS, ROUTINE W REFLEX MICROSCOPIC - Abnormal; Notable for the following components:   Color, Urine AMBER (*)    APPearance CLOUDY (*)    Hgb urine dipstick SMALL (*)    Ketones, ur 5 (*)    Protein, ur 30 (*)    Leukocytes,Ua LARGE (*)    WBC, UA >50 (*)    Bacteria, UA FEW (*)    All other components within normal limits  SODIUM - Abnormal; Notable for the following components:   Sodium 171 (*)    All other components within normal limits  SARS CORONAVIRUS 2 (HOSPITAL ORDER, [place] [facility])  CULTURE, BLOOD (ROUTINE X 2)  CULTURE,  BLOOD (ROUTINE X 2)  URINE CULTURE  LACTIC ACID, PLASMA  APTT  PROTIME-INR  SODIUM, URINE, RANDOM  HIV ANTIBODY (ROUTINE TESTING W REFLEX)  BASIC METABOLIC PANEL  BASIC METABOLIC PANEL  BASIC METABOLIC PANEL  VITAMIN 123456  FOLATE    EKG EKG Interpretation  Date/Time:  Monday July 24 2019 10:20:05 EDT Ventricular Rate:  104 PR Interval:  122 QRS Duration: 94 QT Interval:  364 QTC Calculation: 478 R Axis:   -68 Text Interpretation:  Sinus tachycardia Pulmonary disease pattern Left anterior fascicular block ST & T wave abnormality, consider anterolateral ischemia Abnormal ECG No significant change since last tracing Confirmed by Isla Pence [phone]) on 07/24/2019 10:23:06 AM   Radiology Ct Head Wo Contrast  Result Date: 07/24/2019 CLINICAL DATA:  Altered level of consciousness EXAM: CT HEAD WITHOUT CONTRAST TECHNIQUE: Contiguous axial images were obtained from the base of the skull through the vertex without intravenous contrast. 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 There has been interval increase in caliber of the lateral and third ventricles when compared to the prior examination dated 07/28/2017. Incidental note of cavum septum pellucidum variant of the lateral ventricles. Vascular: No hyperdense vessel or unexpected calcification. Skull: Left hemicraniectomy.  Negative for fracture or focal lesion. Sinuses/Orbits: No acute finding. Other: None. IMPRESSION: 1. 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 There has been interval increase in caliber of the lateral and third  ventricles when compared to the prior examination dated 07/28/2017, concerning for shunt catheter malfunction. No  obvious obstructing lesion along the aqueduct or within the fourth ventricle. Chronic global cerebral volume loss and/or acute fluid status may exaggerate this appearance given time interval from prior examination. 2.  Status post left hemicraniectomy. Electronically Signed   By: Eddie Candle M.D.   On: 07/24/2019 16:28   Dg Chest Port 1 View  Result Date: 07/24/2019 CLINICAL DATA:  Hypotension EXAM: PORTABLE CHEST 1 VIEW COMPARISON:  December 27, 2018. FINDINGS: No edema or consolidation. Heart size and pulmonary vascularity are normal. No adenopathy. Shunt extends along the left hemithorax. There is lower thoracic dextroscoliosis with thoracolumbar levoscoliosis. IMPRESSION: No edema or consolidation. Stable cardiac silhouette. Scoliosis, grossly stable in appearance. Electronically Signed   By: Lowella Grip III M.D.   On: 07/24/2019 11:23    Procedures .Critical Care Performed by: Nettie Elm, PA-C Authorized by: Nettie Elm, PA-C   Critical care provider statement:    Critical care time (minutes):  61   Critical care was necessary to treat or prevent imminent or life-threatening deterioration of the following conditions:  Circulatory failure and metabolic crisis (Hypotension systolic 0000000 requiring multiple IVF, severe hypernatremia )   Critical care was time spent personally by me on the following activities:  Discussions with consultants, evaluation of patient's response to treatment, examination of patient, ordering and performing treatments and interventions, ordering and review of laboratory studies, ordering and review of radiographic studies, pulse oximetry, re-evaluation of patient's condition, obtaining history from patient or surrogate and review of old charts   (including critical care time)  Medications Ordered in ED Medications  sodium chloride flush (NS) 0.9 % injection 10-40 mL (10 mLs Intracatheter Not Given 07/24/19 1248)  sodium chloride flush (NS) 0.9 %  injection 10-40 mL (has no administration in time range)  divalproex (DEPAKOTE) DR tablet 1,000 mg (has no administration in time range)  enoxaparin (LOVENOX) injection 30 mg (has no administration in time range)  0.9 %  sodium chloride infusion (has no administration in time range)  sodium chloride 0.9 % bolus 1,000 mL (0 mLs Intravenous Stopped 07/24/19 1409)  sodium chloride 0.9 % bolus 1,000 mL (0 mLs Intravenous Stopped 07/24/19 1409)    Initial Impression / Assessment and Plan / ED Course  I have reviewed the triage vital signs and the nursing notes.  Pertinent labs & imaging results that were available during my care of the patient were reviewed by me and considered in my medical decision making (see chart for details).  43 year old male presents for evaluation of failure to thrive.  Patient cachectic, pallor. Extremely dry mucous membranes with white scrapable coating to mouth.  His heart and lungs are clear.  Scaphoid abdomen without overlying skin changes to abdomen.  He does have multiple areas of skin breakdown and skin tears to right elbow, right hip and beginning  stages ulcers to sacral area.  He is afebrile.  Mild tachycardia to 101.  Labs, EKG, chest x-ray, IV fluids, blood cultures obtained.  Question overall deconditioning vs acute process causing decreased p.o. intake x4 days.  No evidence of gross infectious process on exam.  Will hold on antibiotics. Getting IVF for hypotension to systolic at 90. On initial evaluation patient had a small 1 cm dead roach fall out of his pants. Moves extremities equally and without facial droop. Low suspicion for intracranial process as cause of his decrease PO intake.  Labs and imaging personally  reviewed:  CBC without leukocytosis, Hgb 12.7, at baseline CMP hypernatremia at 171, creatinine 2.50, previous 1.5 Lactic acid 2.2, likely related to dehydration Urinalysis pending, urine sodium pending EKG sinus tachy, No change from previous. No  STEMI INR 1.2 COVID negative DG chest port without acute findings.  1200: Delay in labs, IVF secondary to difficulty IV stick. IV team current at bedside.   1358: Patient BP improvement to [id number] systolic wit multiple Litters with IVF. Will need admission for significant electrolyte abnormalities and AKI.  14: Consulted with family teaching practice who will evaluate patient for admission. Requesting repeat sodium and head CT. Will order. Pending urinalysis, urine sodium at consult call.  Urinalysis positive for infection. No leukocytosis, fever, tachycardia. Low suspicion for sepsis or sirs however blood culture obtained and urine cultured. Will defer to admitting team for ABX given they have already evaluated patient and placed bed request prior to urine resulting.  The patient appears reasonably stabilized for admission considering the current resources, flow, and capabilities available in the ED at this time, and I doubt any other [facility] requiring further screening and/or treatment in the ED prior to admission.  She has been seen eval by attending physician, Dr.-who agrees with above treatment, plan and disposition.      Final Clinical Impressions(s) / ED Diagnoses   Final diagnoses:  Hypernatremia  AKI (acute kidney injury) ([place])  Failure to thrive in adult    ED Discharge Orders    None       Crewe Heathman A, PA-C 07/24/19 1638    Isla Pence, MD 07/28/19 1237

## 2019-07-24 NOTE — ED Notes (Signed)
Unable to obtain IV access or complete blood draw, attempted also by phlebotomist and tech. IV team orders obtained.

## 2019-07-24 NOTE — ED Notes (Addendum)
ED TO INPATIENT HANDOFF REPORT  ED Nurse Name and Phone #:  [phone] Anette Guarneri Name/Age/Gender [name] 43 y.o. male Room/Bed: 013C/013C  Code Status   Code Status: Full Code  Home/SNF/Other Home Patient oriented to: self and place Is this baseline? Yes   Triage Complete: Triage complete  Chief Complaint Hypotensive  Triage Note Per EMS- pt here from home where he lives with mother. Pt bed bound at  Baseline. Mother reports failure to thrive. Hypotensive with EMS.    Allergies No Known Allergies  Level of Care/Admitting Diagnosis ED Disposition    ED Disposition Condition Comment   Admit  Hospital Area: [facility] [[id number]]  Level of Care: Med-Surg [16]  Covid Evaluation: Confirmed COVID Negative  Diagnosis: Hypernatremia [id number]  Admitting Physician: Kathrene Alu [id number]  Attending Physician: Leeanne Rio [[id number]  PT Class (Do Not Modify): Observation [104]  PT Acc Code (Do Not Modify): Observation [10022]       B Medical/Surgery History Past Medical History:  Diagnosis Date  . Brain tumor (benign) ([place])   . Hypertension   . Mental retardation, mild (I.Q. 50-70)   . Seizures ([place])    Past Surgical History:  Procedure Laterality Date  . BRAIN SURGERY       A IV Location/Drains/Wounds Patient Lines/Drains/Airways Status   Active Line/Drains/Airways    Name:   Placement date:   Placement time:   Site:   Days:   Midline Single Lumen 07/24/19 Midline Left Brachial 8 cm 0 cm   07/24/19    1225    Brachial   less than 1   External Urinary Catheter   07/24/19    1237    -   less than 1          Intake/Output Last 24 hours  Intake/Output Summary (Last 24 hours) at 07/24/2019 1723 Last data filed at 07/24/2019 1409 Gross per 24 hour  Intake 2000 ml  Output -  Net 2000 ml    Labs/Imaging Results for orders placed or performed during the hospital encounter of 07/24/19 (from the past 48 hour(s))  Lactic  acid, plasma     Status: Abnormal   Collection Time: 07/24/19 10:14 AM  Result Value Ref Range   Lactic Acid, Venous 2.2 (HH) 0.5 - 1.9 mmol/L    Comment: CRITICAL RESULT CALLED TO, READ BACK BY AND VERIFIED WITH: HUNNICUTT,B RN @1318  ON [id number] BY FLEMINGS Performed at [facility], [address], [place], [place] [address]   Comprehensive metabolic panel     Status: Abnormal   Collection Time: 07/24/19 10:14 AM  Result Value Ref Range   Sodium 171 (HH) 135 - 145 mmol/L    Comment: CRITICAL RESULT CALLED TO, READ BACK BY AND VERIFIED WITH: HUNNICUT,B RN @1336  ON [id number] BY FLEMINGS    Potassium 4.0 3.5 - 5.1 mmol/L   Chloride 127 (H) 98 - 111 mmol/L   CO2 31 22 - 32 mmol/L   Glucose, Bld 116 (H) 70 - 99 mg/dL   BUN 81 (H) 6 - 20 mg/dL   Creatinine, Ser 2.50 (H) 0.61 - 1.24 mg/dL   Calcium 9.2 8.9 - 10.3 mg/dL   Total Protein 7.7 6.5 - 8.1 g/dL   Albumin 3.3 (L) 3.5 - 5.0 g/dL   AST 73 (H) 15 - 41 U/L   ALT 23 0 - 44 U/L   Alkaline Phosphatase 47 38 - 126 U/L   Total Bilirubin 0.6 0.3 -  1.2 mg/dL   GFR calc non Af Amer 31 (L) >60 mL/min   GFR calc Af Amer 35 (L) >60 mL/min   Anion gap 13 5 - 15    Comment: Performed at [facility] [address], [place], [place] [address]  CBC WITH DIFFERENTIAL     Status: Abnormal   Collection Time: 07/24/19 10:14 AM  Result Value Ref Range   WBC 6.4 4.0 - 10.5 K/uL   RBC 4.04 (L) 4.22 - 5.81 MIL/uL   Hemoglobin 12.7 (L) 13.0 - 17.0 g/dL   HCT 42.5 39.0 - 52.0 %   MCV 105.2 (H) 80.0 - 100.0 fL   MCH 31.4 26.0 - 34.0 pg   MCHC 29.9 (L) 30.0 - 36.0 g/dL   RDW 16.8 (H) 11.5 - 15.5 %   Platelets 139 (L) 150 - 400 K/uL    Comment: REPEATED TO VERIFY   nRBC 1.1 (H) 0.0 - 0.2 %   Neutrophils Relative % 64 %   Neutro Abs 4.1 1.7 - 7.7 K/uL   Lymphocytes Relative 27 %   Lymphs Abs 1.7 0.7 - 4.0 K/uL   Monocytes Relative 9 %   Monocytes Absolute 0.6 0.1 - 1.0 K/uL   Eosinophils Relative 0 %   Eosinophils Absolute 0.0 0.0  - 0.5 K/uL   Basophils Relative 0 %   Basophils Absolute 0.0 0.0 - 0.1 K/uL   Immature Granulocytes 0 %   Abs Immature Granulocytes 0.02 0.00 - 0.07 K/uL    Comment: Performed at [facility], [place] [address], [place], [place] [address]  APTT     Status: None   Collection Time: 07/24/19 10:14 AM  Result Value Ref Range   aPTT 32 24 - 36 seconds    Comment: Performed at [facility] [address], [place], [place] [address]  Protime-INR     Status: None   Collection Time: 07/24/19 10:14 AM  Result Value Ref Range   Prothrombin Time 15.1 11.4 - 15.2 seconds   INR 1.2 0.8 - 1.2    Comment: (NOTE) INR goal varies based on device and disease states. Performed at [facility], [place] [address] [place], [place] [address]   SARS Coronavirus 2 [facility] order, Performed in [facility] hospital lab) Nasopharyngeal Nasopharyngeal Swab     Status: None   Collection Time: 07/24/19 12:35 PM   Specimen: Nasopharyngeal Swab  Result Value Ref Range   SARS Coronavirus 2 NEGATIVE NEGATIVE    Comment: (NOTE) If result is NEGATIVE SARS-CoV-2 target nucleic acids are NOT DETECTED. The SARS-CoV-2 RNA is generally detectable in upper and lower  respiratory specimens during the acute phase of infection. The lowest  concentration of SARS-CoV-2 viral copies this assay can detect is 250  copies / mL. A negative result does not preclude SARS-CoV-2 infection  and should not be used as the sole basis for treatment or other  patient management decisions.  A negative result may occur with  improper specimen collection / handling, submission of specimen other  than nasopharyngeal swab, presence of viral mutation(s) within the  areas targeted by this assay, and inadequate number of viral copies  (<250 copies / mL). A negative result must be combined with clinical  observations, patient history, and epidemiological information. If result is POSITIVE SARS-CoV-2 target nucleic acids are  DETECTED. The SARS-CoV-2 RNA is generally detectable in upper and lower  respiratory specimens dur ing the acute phase of infection.  Positive  results are indicative of active infection with SARS-CoV-2.  Clinical  correlation with patient history and other diagnostic information is  necessary to determine patient infection status.  Positive results do  not rule out bacterial infection or co-infection with other viruses. If result is PRESUMPTIVE POSTIVE SARS-CoV-2 nucleic acids MAY BE PRESENT.   A presumptive positive result was obtained on the submitted specimen  and confirmed on repeat testing.  While 2019 novel coronavirus  (SARS-CoV-2) nucleic acids may be present in the submitted sample  additional confirmatory testing may be necessary for epidemiological  and / or clinical management purposes  to differentiate between  SARS-CoV-2 and other Sarbecovirus currently known to infect humans.  If clinically indicated additional testing with an alternate test  methodology [phone]) is advised. The SARS-CoV-2 RNA is generally  detectable in upper and lower respiratory sp ecimens during the acute  phase of infection. The expected result is Negative. Fact Sheet for Patients:  StrictlyIdeas.no Fact Sheet for Healthcare Providers: BankingDealers.co.za This test is not yet approved or cleared by the [place] FDA and has been authorized for detection and/or diagnosis of SARS-CoV-2 by FDA under an Emergency Use Authorization (EUA).  This EUA will remain in effect (meaning this test can be used) for the duration of the COVID-19 declaration under Section 564(b)(1) of the Act, 21 U.S.C. section 360bbb-3(b)(1), unless the authorization is terminated or revoked sooner. Performed at [facility], [place] [address], [place], [place] [address]   Lactic acid, plasma     Status: None   Collection Time: 07/24/19  2:00 PM  Result Value Ref Range    Lactic Acid, Venous 1.6 0.5 - 1.9 mmol/L    Comment: Performed at [facility] [address], [place], [place] [address]  Urinalysis, Routine w reflex microscopic     Status: Abnormal   Collection Time: 07/24/19  2:10 PM  Result Value Ref Range   Color, Urine AMBER (A) YELLOW    Comment: BIOCHEMICALS MAY BE AFFECTED BY COLOR   APPearance CLOUDY (A) CLEAR   Specific Gravity, Urine 1.028 1.005 - 1.030   pH 5.0 5.0 - 8.0   Glucose, UA NEGATIVE NEGATIVE mg/dL   Hgb urine dipstick SMALL (A) NEGATIVE   Bilirubin Urine NEGATIVE NEGATIVE   Ketones, ur 5 (A) NEGATIVE mg/dL   Protein, ur 30 (A) NEGATIVE mg/dL   Nitrite NEGATIVE NEGATIVE   Leukocytes,Ua LARGE (A) NEGATIVE   RBC / HPF 0-5 0 - 5 RBC/hpf   WBC, UA >50 (H) 0 - 5 WBC/hpf   Bacteria, UA FEW (A) NONE SEEN   Squamous Epithelial / LPF 6-10 0 - 5   WBC Clumps PRESENT    Mucus PRESENT    Hyaline Casts, UA PRESENT     Comment: Performed at [facility], [place] [address] [place], [place] [address]  Sodium, urine, random     Status: None   Collection Time: 07/24/19  2:10 PM  Result Value Ref Range   Sodium, Ur 57 mmol/L    Comment: Performed at [facility] [address] [place], [place] [address]  Sodium     Status: Abnormal   Collection Time: 07/24/19  2:23 PM  Result Value Ref Range   Sodium 171 (HH) 135 - 145 mmol/L    Comment: CRITICAL RESULT CALLED TO, READ BACK BY AND VERIFIED WITH: HUNNICUTT,B RN @1527  ON [id number] BY FLEMINGS Performed at [facility], [place] [address], [place], [place] [address]  Basic metabolic panel     Status: Abnormal   Collection Time: 07/24/19  4:27 PM  Result Value Ref Range   Sodium 170 (HH) 135 - 145 mmol/L    Comment: CRITICAL RESULT CALLED TO, READ BACK BY AND VERIFIED WITH: B HONEYCUTT,RN 1708 07/24/2019 WBOND    Potassium 3.4 (L) 3.5 - 5.1 mmol/L   Chloride >130 (HH) 98 - 111 mmol/L    Comment: CRITICAL RESULT CALLED TO, READ BACK BY AND VERIFIED WITH: B  HONEYCUTT,RN 1708 07/24/2019 WBOND    CO2 32 22 - 32 mmol/L   Glucose, Bld 102 (H) 70 - 99 mg/dL   BUN 74 (H) 6 - 20 mg/dL   Creatinine, Ser 1.90 (H) 0.61 - 1.24 mg/dL   Calcium 8.1 (L) 8.9 - 10.3 mg/dL   GFR calc non Af Amer 43 (L) >60 mL/min   GFR calc Af Amer 49 (L) >60 mL/min   Anion gap NOT CALCULATED 5 - 15    Comment: Performed at [facility] [address], [place], [place] [address]   Ct Head Wo Contrast  Result Date: 07/24/2019 CLINICAL DATA:  Altered level of consciousness EXAM: CT HEAD WITHOUT CONTRAST TECHNIQUE: Contiguous axial images were obtained from the base of the skull through the vertex without intravenous contrast. 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 There has been interval increase in caliber of the lateral and third ventricles when compared to the prior examination dated 07/28/2017. Incidental note of cavum septum pellucidum variant of the lateral ventricles. Vascular: No hyperdense vessel or unexpected calcification. Skull: Left hemicraniectomy.  Negative for fracture or focal lesion. Sinuses/Orbits: No acute finding. Other: None. IMPRESSION: 1. 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 There has been interval increase in caliber of the lateral and third ventricles when compared to the prior examination dated 07/28/2017, concerning for shunt catheter malfunction. No obvious obstructing lesion along the aqueduct or within the fourth ventricle. Chronic global cerebral volume loss and/or acute fluid status may exaggerate this appearance given time interval from prior examination. 2.  Status post left hemicraniectomy. Electronically Signed   By: Eddie Candle M.D.   On:  07/24/2019 16:28   Dg Chest Port 1 View  Result Date: 07/24/2019 CLINICAL DATA:  Hypotension EXAM: PORTABLE CHEST 1 VIEW COMPARISON:  December 27, 2018. FINDINGS: No edema or consolidation. Heart size and pulmonary vascularity are normal. No adenopathy. Shunt extends along the left hemithorax. There is lower thoracic dextroscoliosis with thoracolumbar levoscoliosis. IMPRESSION: No edema or consolidation. Stable cardiac silhouette. Scoliosis, grossly stable in appearance. Electronically Signed   By: Lowella Grip III M.D.   On: 07/24/2019 11:23    Pending Labs Unresulted Labs (From admission, onward)    Start     Ordered   07/25/19 0500  Magnesium  Daily,   R     07/24/19 1613   07/25/19 0500  Phosphorus  Daily,   R     07/24/19 1613   07/25/19 0500  CBC  Tomorrow morning,   R     07/24/19 1613   07/24/19 1723  Osmolality, urine  Add-on,   AD     07/24/19 1722   07/24/19 1652  TSH  Once,   STAT     07/24/19 1651   07/24/19 1627  Folate  Once,   STAT     07/24/19 1627   07/24/19 1625  Vitamin B12  Once,   STAT     07/24/19 1624   07/24/19 [id number]  Basic metabolic panel  Now then every 6 hours,   R (with STAT occurrences)     07/24/19 1613   07/24/19 1606  HIV Antibody  (Routine Testing)  Once,   STAT     07/24/19 1613   07/24/19 1014  Blood Culture (routine x 2)  BLOOD CULTURE X 2,   STAT     07/24/19 1014   07/24/19 1014  Urine culture  ONCE - STAT,   STAT     07/24/19 1014          Vitals/Pain Today's Vitals   07/24/19 1445 07/24/19 1515 07/24/19 1623 07/24/19 1709  BP: 91/64 (!) 82/54 102/63   Pulse:   87 77  Resp: (!) 9 15 12 20   Temp:      TempSrc:      SpO2:  98% 98% 97%  Weight:      Height:        Isolation Precautions No active isolations  Medications Medications  sodium chloride flush (NS) 0.9 % injection 10-40 mL (10 mLs Intracatheter Not Given 07/24/19 1248)  sodium chloride flush (NS) 0.9 % injection 10-40 mL (has no administration in time range)   divalproex (DEPAKOTE) DR tablet 1,000 mg (has no administration in time range)  enoxaparin (LOVENOX) injection 30 mg (has no administration in time range)  0.9 %  sodium chloride infusion (has no administration in time range)  sodium chloride 0.9 % bolus 1,000 mL (0 mLs Intravenous Stopped 07/24/19 1409)  sodium chloride 0.9 % bolus 1,000 mL (0 mLs Intravenous Stopped 07/24/19 1409)    Mobility non-ambulatory     Focused Assessments Neuro Assessment Handoff:  Swallow screen pass? No         Neuro Assessment: Exceptions to WDL Neuro Checks:      Last Documented NIHSS Modified Score:   Has TPA been given? No If patient is a Neuro Trauma and patient is going to OR before floor call report to [facility] [place] nurse: [phone] or [phone]     R Recommendations: See Admitting Provider Note  Report given to:   Additional Notes:  43 y.o. male presenting with hypernatremia, anorexia. PMH is significant for brain tumor, hypertension, intellectual disability, seizures, and visual impairment. Report from caregiver includes ~10 days of decreased thirst and hunger.  His sister reports that in the last 2 years patient has gradually declined, with steadily worsening mentation, unable to walk anymore (he previously walked with a walker), and steady weight loss.  In 2018 patient weighed 68 kg, which reveals an overall weight loss of about 30 pounds.  His sister corroborates patient's entire history.  Patient has a history of brain tumor as a child that had multiple surgeries.  He has a VP shunt in place, no signs of infection, swelling, certainly no acute change in his mental status.

## 2019-07-25 DIAGNOSIS — E870 Hyperosmolality and hypernatremia: Principal | ICD-10-CM

## 2019-07-25 DIAGNOSIS — L8990 Pressure ulcer of unspecified site, unspecified stage: Secondary | ICD-10-CM | POA: Insufficient documentation

## 2019-07-25 LAB — BASIC METABOLIC PANEL
BUN: 55 mg/dL — ABNORMAL HIGH (ref 6–20)
BUN: 59 mg/dL — ABNORMAL HIGH (ref 6–20)
BUN: 61 mg/dL — ABNORMAL HIGH (ref 6–20)
BUN: 69 mg/dL — ABNORMAL HIGH (ref 6–20)
BUN: 75 mg/dL — ABNORMAL HIGH (ref 6–20)
CO2: 25 mmol/L (ref 22–32)
CO2: 26 mmol/L (ref 22–32)
CO2: 26 mmol/L (ref 22–32)
CO2: 27 mmol/L (ref 22–32)
CO2: 27 mmol/L (ref 22–32)
Calcium: 7.6 mg/dL — ABNORMAL LOW (ref 8.9–10.3)
Calcium: 7.7 mg/dL — ABNORMAL LOW (ref 8.9–10.3)
Calcium: 7.7 mg/dL — ABNORMAL LOW (ref 8.9–10.3)
Calcium: 7.8 mg/dL — ABNORMAL LOW (ref 8.9–10.3)
Calcium: 7.9 mg/dL — ABNORMAL LOW (ref 8.9–10.3)
Chloride: >130 mmol/L (ref 98–111)
Chloride: >130 mmol/L (ref 98–111)
Chloride: >130 mmol/L (ref 98–111)
Chloride: >130 mmol/L (ref 98–111)
Chloride: >130 mmol/L (ref 98–111)
Creatinine, Ser: 1.17 mg/dL (ref 0.61–1.24)
Creatinine, Ser: 1.31 mg/dL — ABNORMAL HIGH (ref 0.61–1.24)
Creatinine, Ser: 1.34 mg/dL — ABNORMAL HIGH (ref 0.61–1.24)
Creatinine, Ser: 1.46 mg/dL — ABNORMAL HIGH (ref 0.61–1.24)
Creatinine, Ser: 1.57 mg/dL — ABNORMAL HIGH (ref 0.61–1.24)
GFR calc Af Amer: >60 mL/min (ref >60)
GFR calc Af Amer: >60 mL/min (ref >60)
GFR calc Af Amer: >60 mL/min (ref >60)
GFR calc Af Amer: >60 mL/min (ref >60)
GFR calc Af Amer: >60 mL/min (ref >60)
GFR calc non Af Amer: 54 mL/min — ABNORMAL LOW (ref >60)
GFR calc non Af Amer: 58 mL/min — ABNORMAL LOW (ref >60)
GFR calc non Af Amer: >60 mL/min (ref >60)
GFR calc non Af Amer: >60 mL/min (ref >60)
GFR calc non Af Amer: >60 mL/min (ref >60)
Glucose, Bld: 113 mg/dL — ABNORMAL HIGH (ref 70–99)
Glucose, Bld: 80 mg/dL (ref 70–99)
Glucose, Bld: 87 mg/dL (ref 70–99)
Glucose, Bld: 89 mg/dL (ref 70–99)
Glucose, Bld: 94 mg/dL (ref 70–99)
Potassium: 3.5 mmol/L (ref 3.5–5.1)
Potassium: 3.6 mmol/L (ref 3.5–5.1)
Potassium: 3.6 mmol/L (ref 3.5–5.1)
Potassium: 4.7 mmol/L (ref 3.5–5.1)
Potassium: 4.8 mmol/L (ref 3.5–5.1)
Sodium: 163 mmol/L (ref 135–145)
Sodium: 166 mmol/L (ref 135–145)
Sodium: 168 mmol/L (ref 135–145)
Sodium: 168 mmol/L (ref 135–145)
Sodium: 169 mmol/L (ref 135–145)

## 2019-07-25 LAB — CBC
HCT: 33.7 % — ABNORMAL LOW (ref 39.0–52.0)
Hemoglobin: 10.4 g/dL — ABNORMAL LOW (ref 13.0–17.0)
MCH: 32.2 pg (ref 26.0–34.0)
MCHC: 30.9 g/dL (ref 30.0–36.0)
MCV: 104.3 fL — ABNORMAL HIGH (ref 80.0–100.0)
Platelets: 104 10*3/uL — ABNORMAL LOW (ref 150–400)
RBC: 3.23 MIL/uL — ABNORMAL LOW (ref 4.22–5.81)
RDW: 16.7 % — ABNORMAL HIGH (ref 11.5–15.5)
WBC: 5.8 10*3/uL (ref 4.0–10.5)
nRBC: 0.9 % — ABNORMAL HIGH (ref 0.0–0.2)

## 2019-07-25 LAB — HEPATIC FUNCTION PANEL
ALT: 23 U/L (ref 0–44)
AST: 78 U/L — ABNORMAL HIGH (ref 15–41)
Albumin: 2.3 g/dL — ABNORMAL LOW (ref 3.5–5.0)
Alkaline Phosphatase: 36 U/L — ABNORMAL LOW (ref 38–126)
Bilirubin, Direct: 0.1 mg/dL (ref 0.0–0.2)
Indirect Bilirubin: 0.4 mg/dL (ref 0.3–0.9)
Total Bilirubin: 0.5 mg/dL (ref 0.3–1.2)
Total Protein: 5.5 g/dL — ABNORMAL LOW (ref 6.5–8.1)

## 2019-07-25 LAB — MRSA PCR SCREENING: MRSA by PCR: NEGATIVE

## 2019-07-25 LAB — MAGNESIUM: Magnesium: 2.6 mg/dL — ABNORMAL HIGH (ref 1.7–2.4)

## 2019-07-25 LAB — HIV ANTIBODY (ROUTINE TESTING W REFLEX): HIV Screen 4th Generation wRfx: NONREACTIVE

## 2019-07-25 LAB — PHOSPHORUS: Phosphorus: 4 mg/dL (ref 2.5–4.6)

## 2019-07-25 LAB — FOLATE: Folate: 19.2 ng/mL (ref >5.9)

## 2019-07-25 LAB — OSMOLALITY, URINE: Osmolality, Ur: 833 mOsm/kg (ref 300–900)

## 2019-07-25 MED ORDER — RESOURCE THICKENUP CLEAR PO POWD
ORAL | Status: DC | PRN
  Filled 2019-07-25: qty 125

## 2019-07-25 MED ORDER — ENSURE ENLIVE PO LIQD
237.0000 mL | Freq: Three times a day (TID) | ORAL | Status: DC
  Administered 2019-07-25 – 2019-08-02 (×21): 237 mL via ORAL

## 2019-07-25 MED ORDER — CHLORHEXIDINE GLUCONATE 0.12 % MT SOLN
15.0000 mL | Freq: Two times a day (BID) | OROMUCOSAL | Status: DC
  Administered 2019-07-25 – 2019-08-02 (×17): 15 mL via OROMUCOSAL
  Filled 2019-07-25 (×16): qty 15

## 2019-07-25 MED ORDER — DIVALPROEX SODIUM 500 MG PO DR TAB
1000.0000 mg | DELAYED_RELEASE_TABLET | Freq: Two times a day (BID) | ORAL | Status: DC
  Administered 2019-07-25 – 2019-07-28 (×6): 1000 mg via ORAL
  Filled 2019-07-25 (×7): qty 2

## 2019-07-25 MED ORDER — LACTATED RINGERS IV SOLN
INTRAVENOUS | Status: DC
  Administered 2019-07-25 – 2019-07-26 (×4): via INTRAVENOUS

## 2019-07-25 MED ORDER — ORAL CARE MOUTH RINSE
15.0000 mL | Freq: Two times a day (BID) | OROMUCOSAL | Status: DC
  Administered 2019-07-25 – 2019-08-01 (×16): 15 mL via OROMUCOSAL

## 2019-07-25 MED ORDER — COLLAGENASE 250 UNIT/GM EX OINT
TOPICAL_OINTMENT | Freq: Every day | CUTANEOUS | Status: DC
  Administered 2019-07-25: 10:00:00 via TOPICAL
  Administered 2019-07-26: 1 via TOPICAL
  Administered 2019-07-27 – 2019-08-01 (×6): via TOPICAL
  Filled 2019-07-25 (×4): qty 30

## 2019-07-25 NOTE — Evaluation (Addendum)
Physical Therapy Evaluation Patient Details Name: [name] MRN: [id number] DOB: Jul 21, 1976 Today's Date: 07/25/2019   History of Present Illness  [name] is a 43 y.o. male presenting with hypernatremia, anorexia. PMH is significant for brain tumor, hypertension, intellectual disability, seizures, and visual impairment.  Clinical Impression  Patient presents with decreased mobility, potentially not far off his baseline.  Seems per his brother in law report he has not received therapy before.  Feel he can potentially benefit from skilled PT in the acute setting to maximize mobility to allow d/c home with family support and follow up HHPT.      Follow Up Recommendations Home health PT;Supervision/Assistance - 24 hour    Equipment Recommendations  Other (comment)(airmattress overlay for hosptial bed)    Recommendations for Other Services       Precautions / Restrictions Precautions Precautions: Fall Restrictions Weight Bearing Restrictions: No      Mobility  Bed Mobility Overal bed mobility: Needs Assistance Bed Mobility: Rolling;Sidelying to Sit Rolling: Mod assist Sidelying to sit: Max assist Supine to sit: Max assist   Sit to sidelying: Max assist;Total assist General bed mobility comments: rolled for getting upright, then for hygiene as soiled, assist for legs and trunk to sit and to supine  Transfers Overall transfer level: Needs assistance   Transfers: Lateral/Scoot Transfers          Lateral/Scoot Transfers: Max assist General transfer comment: assist to scoot up toward [facility] with max A for hips and trunk using pad under pt  Ambulation/Gait                Stairs            Wheelchair Mobility    Modified Rankin (Stroke Patients Only)       Balance Overall balance assessment: Needs assistance Sitting-balance support: Feet supported;No upper extremity supported;Single extremity supported Sitting balance-Leahy Scale:  Poor Sitting balance - Comments: reaching up to hold onto something despite cues to keep hands on bed for balance, fatigued quickly at times min A for balance, but overall mod A Postural control: Left lateral lean                                   Pertinent Vitals/Pain Pain Assessment: No/denies pain    Home Living Family/patient expects to be discharged to:: Private residence Living Arrangements: Parent;Other relatives Available Help at Discharge: Family;Available 24 hours/day Type of Home: House Home Access: Stairs to enter   [employer] of Steps: 2 Home Layout: One level Home Equipment: Hospital bed;Wheelchair - manual Additional Comments: assist from son, brother in law and sister 24 hours    Prior Function Level of Independence: Needs assistance   Gait / Transfers Assistance Needed: 2 person lift to w/c  ADL's / Homemaking Assistance Needed: Family assist with all ADLs  Comments: Per PT, pt family provides assist for ADLs     Hand Dominance        Extremity/Trunk Assessment   Upper Extremity Assessment Upper Extremity Assessment: Defer to OT evaluation RUE Deficits / Details: Decreased strength and ROM compared to LUE RUE Coordination: decreased fine motor;decreased gross motor    Lower Extremity Assessment Lower Extremity Assessment: LLE deficits/detail;RLE deficits/detail RLE Deficits / Details: AAROM WFL, strength hip flexion <3/5, knee extension 3-/5, ankle DF 0/5; reports sensation WFL RLE Coordination: decreased gross motor LLE Deficits / Details: AAROM WFL; strength hip flexion 2/5, knee  extension <3/5, ankle DF 3/5; reports sensation WFL LLE Coordination: decreased gross motor    Cervical / Trunk Assessment Cervical / Trunk Assessment: Kyphotic  Communication   Communication: Other (comment);Expressive difficulties(dysarthria)  Cognition Arousal/Alertness: Awake/alert Behavior During Therapy: WFL for tasks  assessed/performed Overall Cognitive Status: History of cognitive impairments - at baseline                                 General Comments: alert and oriented x 4, has baseline deficits per his brother in law in the room      General Comments General comments (skin integrity, edema, etc.): brother in law in the room who works for agency providing care to [name]    Exercises     Assessment/Plan    PT Assessment Patient needs continued PT services  PT Problem List Decreased strength;Decreased mobility;Decreased coordination;Decreased balance;Decreased knowledge of use of DME;Cardiopulmonary status limiting activity;Decreased safety awareness       PT Treatment Interventions Functional mobility training;Therapeutic exercise;Patient/family education;Balance training;Wheelchair mobility training;Therapeutic activities    PT Goals (Current goals can be found in the Care Plan section)  Acute Rehab PT Goals Patient Stated Goal: to get some therapy PT Goal Formulation: With patient/family Time For Goal Achievement: 08/08/19 Potential to Achieve Goals: Fair    Frequency Min 3X/week   Barriers to discharge        Co-evaluation               AM-PAC PT "6 Clicks" Mobility  Outcome Measure Help needed turning from your back to your side while in a flat bed without using bedrails?: A Lot Help needed moving from lying on your back to sitting on the side of a flat bed without using bedrails?: Total Help needed moving to and from a bed to a chair (including a wheelchair)?: Total Help needed standing up from a chair using your arms (e.g., wheelchair or bedside chair)?: Total Help needed to walk in hospital room?: Total Help needed climbing 3-5 steps with a railing? : Total 6 Click Score: 7    End of Session   Activity Tolerance: Patient limited by fatigue Patient left: in bed;with call bell/phone within reach;with bed alarm set   PT Visit Diagnosis: Other  abnormalities of gait and mobility (R26.89);Muscle weakness (generalized) (M62.81)    Time: 1410-1435 PT Time Calculation (min) (ACUTE ONLY): 25 min   Charges:   PT Evaluation $PT Eval Moderate Complexity: 1 Mod PT Treatments $Therapeutic Activity: 8-22 mins        Magda Kiel, [place] [facility] [phone] 07/25/2019   Reginia Naas 07/25/2019, 4:57 PM

## 2019-07-25 NOTE — Consult Note (Signed)
[facility] Nurse wound consult note Reason for Consult: Consult requested for left hip.  [facility] team is working remotely today; reviewed progress notes and photos in the EMR.  Wound type: Left hip with deep tissue pressure injury evolving into unstageable in sections.  Dark reddish purple with loose peeling edges.  Pt also has a full thickness skin tear which is red and moist. Pressure Injury POA: Yes Dressing procedure/placement/frequency: Topical treatment orders provided for bedside nurses to perform daily: Santyl to provide enzymatic debridement of nonviable tissue to left hip, xeroform gauze to promote moist healing of skin tear. Please re-consult if further assistance is needed.  Thank-you,  Julien Girt MSN, [place], [place], [place], [place]

## 2019-07-25 NOTE — Progress Notes (Signed)
CRITICAL VALUE ALERT  Critical Value:  Na 169 Chloride 130  Date & Time Notied:  07/25/19 12:38 AM  Provider Notified: Dr. Grandville Silos  Orders Received/Actions taken: no new orders at this time

## 2019-07-25 NOTE — Progress Notes (Signed)
Occupational Therapy Evaluation Patient Details Name: [name] MRN: [id number] DOB: Aug 22, 1976 Today's Date: 07/25/2019    History of Present Illness [name] is a 43 y.o. male presenting with hypernatremia, anorexia. PMH is significant for brain tumor, hypertension, intellectual disability, seizures, and visual impairment.   Clinical Impression   PTA, pt lived with family and required assist from family for all ADLs; information collected from chart review and discussion with PT. Pt currently presenting with decreased strength, balance, and cognition, however feel he is close to baseline. Pt performed bed mobility, oral care, and toileting with max A- total A for safety. Pt would benefit from continued OT services acutely to address deficits with balance, strength, and to reduce caregiver burden. Recommend dc home with HHOT to provide family education and reduce caregiver burden pending discussion with family about their needs.     Follow Up Recommendations  Home health OT;Other (comment)(Will discuss with family)    Equipment Recommendations  None recommended by OT;Other (comment)(Need to confirm available equipment with family)    Recommendations for Other Services PT consult     Precautions / Restrictions Precautions Precautions: Fall Restrictions Weight Bearing Restrictions: No      Mobility Bed Mobility Overal bed mobility: Needs Assistance Bed Mobility: Supine to Sit;Rolling;Sit to Sidelying Rolling: Mod assist   Supine to sit: Max assist   Sit to sidelying: Max assist General bed mobility comments: Pt participated by reaching to pull with LUE, requiring max A to bring into sitting  Transfers                 General transfer comment: Deferred for safety    Balance Overall balance assessment: Needs assistance Sitting-balance support: No upper extremity supported;Feet supported Sitting balance-Leahy Scale: Poor Sitting balance - Comments: Pt  required support to maintain upright sitting posture Postural control: Left lateral lean                                 ADL either performed or assessed with clinical judgement   ADL Overall ADL's : Needs assistance/impaired Eating/Feeding: Maximal assistance;Sitting   Grooming: Oral care;Wash/dry face;Sitting;Maximal assistance Grooming Details (indicate cue type and reason): Pt required min A for sitting balance during grooming tasks. Performed oral care with toothbrush only, pt able to bring to mouth and lightly brush with min guard A, would require max A for managing toothpaste, thorough brushing, and rinsing Upper Body Bathing: Maximal assistance;Sitting   Lower Body Bathing: Total assistance;Bed level   Upper Body Dressing : Maximal assistance;Sitting   Lower Body Dressing: Total assistance;Bed level   Toilet Transfer: Total assistance   Toileting- Clothing Manipulation and Hygiene: Total assistance;Bed level Toileting - Clothing Manipulation Details (indicate cue type and reason): Pt required total A for peri-care in bed following bowel movement. Pt participated in rolling with mod A     Functional mobility during ADLs: Total assistance(lift for transfer) General ADL Comments: Pt presents bed bound, requiring mx A-total A for all ADLs.      Vision Patient Visual Report: Other (comment)(baseline visual impairment, unsure of deficit)       Perception     Praxis      Pertinent Vitals/Pain Pain Assessment: No/denies pain     Hand Dominance     Extremity/Trunk Assessment Upper Extremity Assessment Upper Extremity Assessment: RUE deficits/detail RUE Deficits / Details: Decreased strength and ROM compared to LUE RUE Coordination: decreased fine motor;decreased gross  motor   Lower Extremity Assessment Lower Extremity Assessment: Defer to PT evaluation   Cervical / Trunk Assessment Cervical / Trunk Assessment: Kyphotic   Communication  Communication Communication: Other (comment);Expressive difficulties(Pt baseline with intellectual disabilities)   Cognition Arousal/Alertness: Awake/alert Behavior During Therapy: Impulsive Overall Cognitive Status: History of cognitive impairments - at baseline                                 General Comments: Pt presents with cognitive impairments at basline. Demonstrated impulsive behavior sitting EOB, but feel this is close to baseline   General Comments  VSS    Exercises     Shoulder Instructions      Home Living Family/patient expects to be discharged to:: Private residence Living Arrangements: Parent;Other relatives Available Help at Discharge: Family;Available 24 hours/day                         Home Equipment: (Need to confirm available equipment with family)   Additional Comments: Pt reported living with his mom and her helping with ADLs, however unsure reliability because poor historian due to baseline cognitive status. Per PT, family provides assist for all ADLs, uses lift to get out of bed      Prior Functioning/Environment Level of Independence: Needs assistance  Gait / Transfers Assistance Needed: Family uses lift for OOB ADL's / Homemaking Assistance Needed: Family assist with all ADLs   Comments: Per PT, pt family provides assist for ADLs        OT Problem List: Decreased strength;Decreased range of motion;Decreased activity tolerance;Impaired balance (sitting and/or standing);Decreased cognition;Decreased safety awareness;Decreased knowledge of use of DME or AE;Decreased knowledge of precautions      OT Treatment/Interventions: Self-care/ADL training;Therapeutic activities;Patient/family education;Balance training    OT Goals(Current goals can be found in the care plan section) Acute Rehab OT Goals Patient Stated Goal: "get up on my feet" OT Goal Formulation: With patient Time For Goal Achievement: 08/08/19 Potential to  Achieve Goals: Fair  OT Frequency: Min 2X/week   Barriers to D/C:            Co-evaluation              AM-PAC OT "6 Clicks" Daily Activity     Outcome Measure Help from another person eating meals?: A Lot Help from another person taking care of personal grooming?: A Lot Help from another person toileting, which includes using toliet, bedpan, or urinal?: Total Help from another person bathing (including washing, rinsing, drying)?: A Lot Help from another person to put on and taking off regular upper body clothing?: A Lot Help from another person to put on and taking off regular lower body clothing?: Total 6 Click Score: 10   End of Session Nurse Communication: Mobility status  Activity Tolerance: Patient tolerated treatment well Patient left: in bed;with bed alarm set;with call bell/phone within reach  OT Visit Diagnosis: Cognitive communication deficit (R41.841);Muscle weakness (generalized) (M62.81);Adult, failure to thrive (R62.7);Other symptoms and signs involving cognitive function Symptoms and signs involving cognitive functions: (baseline cognitive impairments)                Time: [id number] OT Time Calculation (min): 22 min Charges:  OT General Charges $OT Visit: 1 Visit OT Evaluation $OT Eval Moderate Complexity: [place], OT Student  Gus Rankin 07/25/2019, 4:35 PM

## 2019-07-25 NOTE — Evaluation (Signed)
Clinical/Bedside Swallow Evaluation Patient Details  Name: [name] MRN: [id number] Date of Birth: [birth year]  Today's Date: 07/25/2019 Time: SLP Start Time (ACUTE ONLY): 0848 SLP Stop Time (ACUTE ONLY): 0915 SLP Time Calculation (min) (ACUTE ONLY): 27 min  Past Medical History:  Past Medical History:  Diagnosis Date  . Brain tumor (benign) ([facility])   . Hypertension   . Mental retardation, mild (I.Q. 50-70)   . Seizures ([facility])    Past Surgical History:  Past Surgical History:  Procedure Laterality Date  . BRAIN SURGERY     HPI:  [name] a 43 y.o.malewithpast medical history significant for L brain tumor removal via craniectomy, hypertension, mental retardation, seizures, scoliosis, and visual impairment who presents for evaluation of failure to thrive. His sister reports that in the last 2 years patient has gradually declined, with steadily worsening mentation and steady weight loss. In 2018 patient weighed 68 kg, which reveals an overall weight loss of about 30 pounds in the interim. Patient states that he frequently is not hungry, but he cannot explain why that is.  He denies any acid reflux, pain with eating, nausea, and vomiting. 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 Currently NPO.    Assessment / Plan / Recommendation Clinical Impression  Pt presented alert and cooperative with R facial asymmetry and spasticity in R arm. Fine motor movements appear impaired and pt had difficulty controlling sip size and cohesion. Mild-mod dysarthria present reduced intelligibility of speech. Pt previously on thickened liquids prior to hospital admission. Given liquids via cup pt would try to drink it all in one sip with large gulps. Thin via straw with small sips produced an immediate weak cough. Nectar via straw had no s/sx of aspiration or oral phase impairments. Puree trials WNL, required assistance with self-feeding  due to decreased grasp. Solids required a verbal cue to swallow residue on second trial but was successfully cleared. Recommend starting him on Dys 2 with nectar thick liquids, meds crushed in puree (staff to assist with feeding and cueing for small sips/bites) with f/u for further diet management and PO trials from SLP. Plan to perform MBS to determine current swallow ability.  SLP Visit Diagnosis: Dysphagia, unspecified (R13.10)    Aspiration Risk  Mild aspiration risk    Diet Recommendation Dysphagia 2 (Fine chop);Nectar-thick liquid   Liquid Administration via: Straw Medication Administration: Crushed with puree Supervision: Full supervision/cueing for compensatory strategies Compensations: Small sips/bites;Minimize environmental distractions Postural Changes: Seated upright at 90 degrees    Other  Recommendations Oral Care Recommendations: Oral care BID;Staff/trained caregiver to provide oral care Other Recommendations: Order thickener from pharmacy   Follow up Recommendations        Frequency and Duration min 2x/week  2 weeks       Prognosis Prognosis for Safe Diet Advancement: Good Barriers to Reach Goals: Cognitive deficits      Swallow Study   General Date of Onset: 07/24/19 HPI: [name] a 43 y.o.malewithpast medical history significant for L brain tumor removal via craniectomy, hypertension, mental retardation, seizures, scoliosis, and visual impairment who presents for evaluation of failure to thrive. His sister reports that in the last 2 years patient has gradually declined, with steadily worsening mentation and steady weight loss. In 2018 patient weighed 68 kg, which reveals an overall weight loss of about 30 pounds in the interim. Patient states that he frequently is not hungry, but he cannot explain why that is.  He denies any acid reflux, pain with eating, nausea, and vomiting. Head CT 9/28 showed cystic encephalomalacia L hemispher, CXR remarkable only  for baseline scoliosis. Currently NPO.  Type of Study: Bedside Swallow Evaluation Previous Swallow Assessment: (none located for [facility]) Diet Prior to this Study: NPO Temperature Spikes Noted: No Respiratory Status: Room air History of Recent Intubation: No Behavior/Cognition: Alert;Cooperative;Pleasant mood;Distractible;Requires cueing Oral Cavity Assessment: Other (comment)(lingual candidias ) Oral Care Completed by SLP: Yes Oral Cavity - Dentition: Poor condition;Missing dentition Vision: Functional for self-feeding Self-Feeding Abilities: Needs assist Patient Positioning: Upright in bed Baseline Vocal Quality: Normal Volitional Cough: Weak Volitional Swallow: Able to elicit    Oral/Motor/Sensory Function Overall Oral Motor/Sensory Function: Mild impairment Facial ROM: Reduced right Facial Symmetry: Abnormal symmetry right Facial Strength: Reduced right Facial Sensation: Within Functional Limits Lingual ROM: Within Functional Limits Lingual Symmetry: Within Functional Limits Lingual Strength: Within Functional Limits Velum: Within Functional Limits Mandible: Within Functional Limits   Ice Chips Ice chips: Not tested   Thin Liquid Thin Liquid: Impaired Presentation: Cup;Straw Pharyngeal  Phase Impairments: Cough - Immediate    Nectar Thick Nectar Thick Liquid: Within functional limits Presentation: Cup;Straw;Self Fed   Honey Thick Honey Thick Liquid: Not tested   Puree Puree: Within functional limits Presentation: Spoon;Self Fed   Solid     Solid: Impaired Presentation: Self Fed Oral Phase Impairments: Poor awareness of bolus;Other (comment)(when distracted) Oral Phase Functional Implications: Other (comment);Oral residue(cleared with verbal cue)      [name] 07/25/2019,11:20 AM

## 2019-07-25 NOTE — Progress Notes (Signed)
CRITICAL VALUE ALERT  Critical Value:  Na 168 Chloride >130  Date & Time Notied:  07/25/19 4:39 AM  Provider Notified: Dr. Grandville Silos  Orders Received/Actions taken: no new orders at this time

## 2019-07-25 NOTE — Progress Notes (Signed)
Acknowledging critical na 168, appropriately trending down at a safe pace.  No change in plan at this time  -Dr. Criss Rosales

## 2019-07-25 NOTE — Consult Note (Signed)
Reason for Consult: Referring Physician:   EBER [name] is an 43 y.o. male.  HPI: Mr. Norsworthy has a history of hypertension, mental retardation, seizures, and hydrocephalus. 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 He presented to the ED on 07/24/2019 with hypernatremia, dehydration, and altered mental status. His most recent shunt appears to be a Codman programmed at 60 mmHg. A CT head was performed and the report stated there was an interval increase in caliber of the lateral and third ventricles when compared to the prior examination. Neurosurgery was consulted for further evaluation and recommendations.  Past Medical History:  Diagnosis Date  . Brain tumor (benign) ([place])   . Hypertension   . Mental retardation, mild (I.Q. 50-70)   . Seizures ([place])     Past Surgical History:  Procedure Laterality Date  . BRAIN SURGERY      Family History  Problem Relation Age of Onset  . Diabetes Mother   . Hypertension Mother   . Atrial fibrillation Mother   . Prostate cancer Father   . Multiple sclerosis Sister        Deceased  . Multiple sclerosis Sister        Deceased  . Hypertension Sister     Social History:  reports that he has never smoked. He has never used smokeless tobacco. He reports that he does not drink alcohol or use drugs.  Allergies: No Known Allergies  Medications: I have reviewed the patient's current medications.  Results for orders placed or performed during the hospital encounter of 07/24/19 (from the past 48 hour(s))  Lactic acid, plasma     Status: Abnormal   Collection Time: 07/24/19 10:14 AM  Result Value Ref Range   Lactic Acid, Venous 2.2 (HH) 0.5 - 1.9 mmol/L    Comment: CRITICAL RESULT CALLED TO, READ BACK BY AND VERIFIED WITH: HUNNICUTT,B RN @1318  ON [id number] BY FLEMINGS Performed at [facility], [address], [place], [place] [address]   Comprehensive metabolic panel      Status: Abnormal   Collection Time: 07/24/19 10:14 AM  Result Value Ref Range   Sodium 171 (HH) 135 - 145 mmol/L    Comment: CRITICAL RESULT CALLED TO, READ BACK BY AND VERIFIED WITH: HUNNICUT,B RN @1336  ON [id number] BY FLEMINGS    Potassium 4.0 3.5 - 5.1 mmol/L   Chloride 127 (H) 98 - 111 mmol/L   CO2 31 22 - 32 mmol/L   Glucose, Bld 116 (H) 70 - 99 mg/dL   BUN 81 (H) 6 - 20 mg/dL   Creatinine, Ser 2.50 (H) 0.61 - 1.24 mg/dL   Calcium 9.2 8.9 - 10.3 mg/dL   Total Protein 7.7 6.5 - 8.1 g/dL   Albumin 3.3 (L) 3.5 - 5.0 g/dL   AST 73 (H) 15 - 41 U/L   ALT 23 0 - 44 U/L   Alkaline Phosphatase 47 38 - 126 U/L   Total Bilirubin 0.6 0.3 - 1.2 mg/dL   GFR calc non Af Amer 31 (L) >60 mL/min   GFR calc Af Amer 35 (L) >60 mL/min   Anion gap 13 5 - 15    Comment: Performed at [facility], [place] [address], [place], [place] [address]  CBC WITH DIFFERENTIAL     Status: Abnormal   Collection Time: 07/24/19 10:14 AM  Result Value Ref Range   WBC 6.4 4.0 - 10.5 K/uL   RBC 4.04 (L) 4.22 -  5.81 MIL/uL   Hemoglobin 12.7 (L) 13.0 - 17.0 g/dL   HCT 42.5 39.0 - 52.0 %   MCV 105.2 (H) 80.0 - 100.0 fL   MCH 31.4 26.0 - 34.0 pg   MCHC 29.9 (L) 30.0 - 36.0 g/dL   RDW 16.8 (H) 11.5 - 15.5 %   Platelets 139 (L) 150 - 400 K/uL    Comment: REPEATED TO VERIFY   nRBC 1.1 (H) 0.0 - 0.2 %   Neutrophils Relative % 64 %   Neutro Abs 4.1 1.7 - 7.7 K/uL   Lymphocytes Relative 27 %   Lymphs Abs 1.7 0.7 - 4.0 K/uL   Monocytes Relative 9 %   Monocytes Absolute 0.6 0.1 - 1.0 K/uL   Eosinophils Relative 0 %   Eosinophils Absolute 0.0 0.0 - 0.5 K/uL   Basophils Relative 0 %   Basophils Absolute 0.0 0.0 - 0.1 K/uL   Immature Granulocytes 0 %   Abs Immature Granulocytes 0.02 0.00 - 0.07 K/uL    Comment: Performed at [facility], [address] [place], [place] [address]  APTT     Status: None   Collection Time: 07/24/19 10:14 AM  Result Value Ref Range   aPTT 32 24 - 36 seconds    Comment:  Performed at [facility] [address], [place], [place] [address]  Protime-INR     Status: None   Collection Time: 07/24/19 10:14 AM  Result Value Ref Range   Prothrombin Time 15.1 11.4 - 15.2 seconds   INR 1.2 0.8 - 1.2    Comment: (NOTE) INR goal varies based on device and disease states. Performed at [facility], [place] [address], [place], [place] [address]   SARS Coronavirus 2 [facility] order, Performed in [facility] hospital lab) Nasopharyngeal Nasopharyngeal Swab     Status: None   Collection Time: 07/24/19 12:35 PM   Specimen: Nasopharyngeal Swab  Result Value Ref Range   SARS Coronavirus 2 NEGATIVE NEGATIVE    Comment: (NOTE) If result is NEGATIVE SARS-CoV-2 target nucleic acids are NOT DETECTED. The SARS-CoV-2 RNA is generally detectable in upper and lower  respiratory specimens during the acute phase of infection. The lowest  concentration of SARS-CoV-2 viral copies this assay can detect is 250  copies / mL. A negative result does not preclude SARS-CoV-2 infection  and should not be used as the sole basis for treatment or other  patient management decisions.  A negative result may occur with  improper specimen collection / handling, submission of specimen other  than nasopharyngeal swab, presence of viral mutation(s) within the  areas targeted by this assay, and inadequate number of viral copies  (<250 copies / mL). A negative result must be combined with clinical  observations, patient history, and epidemiological information. If result is POSITIVE SARS-CoV-2 target nucleic acids are DETECTED. The SARS-CoV-2 RNA is generally detectable in upper and lower  respiratory specimens dur ing the acute phase of infection.  Positive  results are indicative of active infection with SARS-CoV-2.  Clinical  correlation with patient history and other diagnostic information is  necessary to determine patient infection status.  Positive results do  not rule out  bacterial infection or co-infection with other viruses. If result is PRESUMPTIVE POSTIVE SARS-CoV-2 nucleic acids MAY BE PRESENT.   A presumptive positive result was obtained on the submitted specimen  and confirmed on repeat testing.  While 2019 novel coronavirus  (SARS-CoV-2) nucleic acids may be present in the  submitted sample  additional confirmatory testing may be necessary for epidemiological  and / or clinical management purposes  to differentiate between  SARS-CoV-2 and other Sarbecovirus currently known to infect humans.  If clinically indicated additional testing with an alternate test  methodology [phone]) is advised. The SARS-CoV-2 RNA is generally  detectable in upper and lower respiratory sp ecimens during the acute  phase of infection. The expected result is Negative. Fact Sheet for Patients:  StrictlyIdeas.no Fact Sheet for Healthcare Providers: BankingDealers.co.za This test is not yet approved or cleared by the [place] FDA and has been authorized for detection and/or diagnosis of SARS-CoV-2 by FDA under an Emergency Use Authorization (EUA).  This EUA will remain in effect (meaning this test can be used) for the duration of the COVID-19 declaration under Section 564(b)(1) of the Act, 21 U.S.C. section 360bbb-3(b)(1), unless the authorization is terminated or revoked sooner. Performed at [facility], [place] [address], [place], [place] [address]   Blood Culture (routine x 2)     Status: None (Preliminary result)   Collection Time: 07/24/19 12:38 PM   Specimen: BLOOD  Result Value Ref Range   Specimen Description BLOOD LEFT ANTECUBITAL    Special Requests      BOTTLES DRAWN AEROBIC AND ANAEROBIC Blood Culture adequate volume   Culture      NO GROWTH < 24 HOURS Performed at [facility], [place] [address], [place], [place] [address]    Report Status PENDING   Lactic acid, plasma     Status: None    Collection Time: 07/24/19  2:00 PM  Result Value Ref Range   Lactic Acid, Venous 1.6 0.5 - 1.9 mmol/L    Comment: Performed at [facility], [place] [address] [place], [place] [address]  Urine culture     Status: None (Preliminary result)   Collection Time: 07/24/19  2:10 PM   Specimen: In/Out Cath Urine  Result Value Ref Range   Specimen Description IN/OUT CATH URINE    Special Requests NONE    Culture      CULTURE REINCUBATED FOR BETTER GROWTH Performed at [facility], [place] [address], [place], [place] [address]    Report Status PENDING   Urinalysis, Routine w reflex microscopic     Status: Abnormal   Collection Time: 07/24/19  2:10 PM  Result Value Ref Range   Color, Urine AMBER (A) YELLOW    Comment: BIOCHEMICALS MAY BE AFFECTED BY COLOR   APPearance CLOUDY (A) CLEAR   Specific Gravity, Urine 1.028 1.005 - 1.030   pH 5.0 5.0 - 8.0   Glucose, UA NEGATIVE NEGATIVE mg/dL   Hgb urine dipstick SMALL (A) NEGATIVE   Bilirubin Urine NEGATIVE NEGATIVE   Ketones, ur 5 (A) NEGATIVE mg/dL   Protein, ur 30 (A) NEGATIVE mg/dL   Nitrite NEGATIVE NEGATIVE   Leukocytes,Ua LARGE (A) NEGATIVE   RBC / HPF 0-5 0 - 5 RBC/hpf   WBC, UA >50 (H) 0 - 5 WBC/hpf   Bacteria, UA FEW (A) NONE SEEN   Squamous Epithelial / LPF 6-10 0 - 5   WBC Clumps PRESENT    Mucus PRESENT    Hyaline Casts, UA PRESENT     Comment: Performed at [facility], [place] [address], [place], [place] [address]  Sodium, urine, random     Status: None   Collection Time: 07/24/19  2:10 PM  Result Value Ref Range   Sodium, Ur 57 mmol/L  Comment: Performed at [facility], [place] [address], [place], [place] [address]  Osmolality, urine     Status: None   Collection Time: 07/24/19  2:10 PM  Result Value Ref Range   Osmolality, Ur 833 300 - 900 mOsm/kg    Comment: Performed at [facility] [address] [place], [place] [address]  Sodium     Status: Abnormal   Collection Time: 07/24/19  2:23 PM   Result Value Ref Range   Sodium 171 (HH) 135 - 145 mmol/L    Comment: CRITICAL RESULT CALLED TO, READ BACK BY AND VERIFIED WITH: HUNNICUTT,B RN @1527  ON [id number] BY FLEMINGS Performed at [facility], [place] [address], [place], [place] [address]   HIV Antibody  (Routine Testing)     Status: None   Collection Time: 07/24/19  4:27 PM  Result Value Ref Range   HIV Screen 4th Generation wRfx Non Reactive Non Reactive    Comment: (NOTE) Performed At: [facility] [place], [place] [id number] Rush Farmer MD [id number]   Basic metabolic panel     Status: Abnormal   Collection Time: 07/24/19  4:27 PM  Result Value Ref Range   Sodium 170 (HH) 135 - 145 mmol/L    Comment: CRITICAL RESULT CALLED TO, READ BACK BY AND VERIFIED WITH: B HONEYCUTT,RN 1708 07/24/2019 WBOND    Potassium 3.4 (L) 3.5 - 5.1 mmol/L   Chloride >130 (HH) 98 - 111 mmol/L    Comment: CRITICAL RESULT CALLED TO, READ BACK BY AND VERIFIED WITH: B HONEYCUTT,RN 1708 07/24/2019 WBOND    CO2 32 22 - 32 mmol/L   Glucose, Bld 102 (H) 70 - 99 mg/dL   BUN 74 (H) 6 - 20 mg/dL   Creatinine, Ser 1.90 (H) 0.61 - 1.24 mg/dL   Calcium 8.1 (L) 8.9 - 10.3 mg/dL   GFR calc non Af Amer 43 (L) >60 mL/min   GFR calc Af Amer 49 (L) >60 mL/min   Anion gap NOT CALCULATED 5 - 15    Comment: Performed at [facility], [place] [address] [place], [place] [address]  Vitamin B12     Status: None   Collection Time: 07/24/19  4:27 PM  Result Value Ref Range   Vitamin B-12 749 180 - 914 pg/mL    Comment: (NOTE) This assay is not validated for testing neonatal or myeloproliferative syndrome specimens for Vitamin B12 levels. Performed at [facility], [place] [address] [place], [place] [address]   Folate     Status: None   Collection Time: 07/24/19  4:27 PM  Result Value Ref Range   Folate 19.2 >5.9 ng/mL    Comment: Performed at [facility], [place] [address], [place], [place] [address]  TSH      Status: None   Collection Time: 07/24/19  4:52 PM  Result Value Ref Range   TSH 0.653 0.350 - 4.500 uIU/mL    Comment: Performed by a 3rd Generation assay with a functional sensitivity of <=0.01 uIU/mL. Performed at [facility], [place] [address], [place], [place] [id number]   Basic metabolic panel     Status: Abnormal   Collection Time: 07/24/19  8:30 PM  Result Value Ref Range   Sodium 169 (HH) 135 - 145 mmol/L    Comment: REPEATED TO VERIFY B HONEYCUTT,RN 1708 07/24/2019 WBOND CRITICAL RESULT CALLED TO, READ BACK BY AND VERIFIED WITH: CORRECTED ON 09/28 AT 2238: PREVIOUSLY REPORTED  AS 169 REPEATED TO VERIFY B HONEYCUTT,RN 1708 07/24/2019 WBOND    Potassium 3.7 3.5 - 5.1 mmol/L   Chloride >130 (HH) 98 - 111 mmol/L    Comment: CRITICAL RESULT CALLED TO, READ BACK BY AND VERIFIED WITH: B HONEYCUTT,RN 1708 07/24/2019 WBOND    CO2 28 22 - 32 mmol/L   Glucose, Bld 101 (H) 70 - 99 mg/dL   BUN 77 (H) 6 - 20 mg/dL   Creatinine, Ser 1.70 (H) 0.61 - 1.24 mg/dL   Calcium 7.9 (L) 8.9 - 10.3 mg/dL   GFR calc non Af Amer 49 (L) >60 mL/min   GFR calc Af Amer 56 (L) >60 mL/min   Anion gap NOT CALCULATED 5 - 15    Comment: Performed at [facility], [place] [address] [place], [place] [address]  Osmolality     Status: Abnormal   Collection Time: 07/24/19  8:30 PM  Result Value Ref Range   Osmolality 384 (HH) 275 - 295 mOsm/kg    Comment: REPEATED TO VERIFY CRITICAL RESULT CALLED TO, READ BACK BY AND VERIFIED WITH: Berta Minor, RN AT 2106 ON 07/24/2019 BY Epifanio Lesches S Performed at [facility], [place] [address] [place], [place] [address]   Blood Culture (routine x 2)     Status: None (Preliminary result)   Collection Time: 07/24/19  9:30 PM   Specimen: BLOOD  Result Value Ref Range   Specimen Description BLOOD LEFT ANTECUBITAL    Special Requests      BOTTLES DRAWN AEROBIC AND ANAEROBIC Blood Culture adequate volume   Culture      NO GROWTH < 12 HOURS Performed at [facility], [place] [address], [place], [place] [address]    Report Status PENDING   MRSA PCR Screening     Status: None   Collection Time: 07/24/19 10:47 PM   Specimen: Nasal Mucosa; Nasopharyngeal  Result Value Ref Range   MRSA by PCR NEGATIVE NEGATIVE    Comment:        The GeneXpert MRSA Assay (FDA approved for NASAL specimens only), is one component of a comprehensive MRSA colonization surveillance program. It is not intended to diagnose MRSA infection nor to guide or monitor treatment for MRSA infections. Performed at [facility], [place] [address], [place], [place] [id number]   Basic metabolic panel     Status: Abnormal   Collection Time: 07/24/19 11:50 PM  Result Value Ref Range   Sodium 169 (HH) 135 - 145 mmol/L    Comment: CRITICAL RESULT CALLED TO, READ BACK BY AND VERIFIED WITH: WEAVER J,RN 07/25/19 0034 WAYK    Potassium 4.7 3.5 - 5.1 mmol/L    Comment: SPECIMEN HEMOLYZED. HEMOLYSIS MAY AFFECT INTEGRITY OF RESULTS.   Chloride >130 (HH) 98 - 111 mmol/L    Comment: CRITICAL RESULT CALLED TO, READ BACK BY AND VERIFIED WITH: WEAVER J,RN 07/25/19 0034 WAYK    CO2 25 22 - 32 mmol/L   Glucose, Bld 87 70 - 99 mg/dL   BUN 75 (H) 6 - 20 mg/dL   Creatinine, Ser 1.57 (H) 0.61 - 1.24 mg/dL   Calcium 7.9 (L) 8.9 - 10.3 mg/dL   GFR calc non Af Amer 54 (L) >60 mL/min   GFR calc Af Amer >60 >60 mL/min    Comment: Performed at [facility] [address] [place], [place] [address]  CBC     Status: Abnormal   Collection Time: 07/25/19  3:51 AM  Result  Value Ref Range   WBC 5.8 4.0 - 10.5 K/uL   RBC 3.23 (L) 4.22 - 5.81 MIL/uL   Hemoglobin 10.4 (L) 13.0 - 17.0 g/dL   HCT 33.7 (L) 39.0 - 52.0 %   MCV 104.3 (H) 80.0 - 100.0 fL   MCH 32.2 26.0 - 34.0 pg   MCHC 30.9 30.0 - 36.0 g/dL   RDW 16.7 (H) 11.5 - 15.5 %   Platelets 104 (L) 150 - 400 K/uL    Comment: REPEATED TO VERIFY PLATELET COUNT CONFIRMED BY SMEAR SPECIMEN CHECKED FOR CLOTS Immature Platelet Fraction may  be clinically indicated, consider ordering this additional test [id number]    nRBC 0.9 (H) 0.0 - 0.2 %    Comment: Performed at [facility], [place] [address], [place], [place] [id number]  Basic metabolic panel     Status: Abnormal   Collection Time: 07/25/19  4:02 AM  Result Value Ref Range   Sodium 168 (HH) 135 - 145 mmol/L    Comment: CRITICAL RESULT CALLED TO, READ BACK BY AND VERIFIED WITH: PROCTOR R,RN 07/25/19 0438 WAYK    Potassium 3.6 3.5 - 5.1 mmol/L   Chloride >130 (HH) 98 - 111 mmol/L    Comment: CRITICAL RESULT CALLED TO, READ BACK BY AND VERIFIED WITH: PROCTOR R,RN 07/25/19 0438 WAYK    CO2 26 22 - 32 mmol/L   Glucose, Bld 80 70 - 99 mg/dL   BUN 69 (H) 6 - 20 mg/dL   Creatinine, Ser 1.46 (H) 0.61 - 1.24 mg/dL   Calcium 7.7 (L) 8.9 - 10.3 mg/dL   GFR calc non Af Amer 58 (L) >60 mL/min   GFR calc Af Amer >60 >60 mL/min    Comment: Performed at [facility], [place] [address], [place], [place] [address]  Magnesium     Status: Abnormal   Collection Time: 07/25/19  4:02 AM  Result Value Ref Range   Magnesium 2.6 (H) 1.7 - 2.4 mg/dL    Comment: Performed at [facility] [address], [place], [place] [address]  Phosphorus     Status: None   Collection Time: 07/25/19  4:02 AM  Result Value Ref Range   Phosphorus 4.0 2.5 - 4.6 mg/dL    Comment: Performed at [facility] [address] [place], [place] [address]    Ct Head Wo Contrast  Result Date: 07/24/2019 CLINICAL DATA:  Altered level of consciousness EXAM: CT HEAD WITHOUT CONTRAST TECHNIQUE: Contiguous axial images were obtained from the base of the skull through the vertex without intravenous contrast. 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 There has been interval increase in caliber of the lateral and third ventricles when  compared to the prior examination dated 07/28/2017. Incidental note of cavum septum pellucidum variant of the lateral ventricles. Vascular: No hyperdense vessel or unexpected calcification. Skull: Left hemicraniectomy.  Negative for fracture or focal lesion. Sinuses/Orbits: No acute finding. Other: None. IMPRESSION: 1. 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 There has been interval increase in caliber of the lateral and third ventricles when compared to the prior examination dated 07/28/2017, concerning for shunt catheter malfunction. No obvious obstructing lesion along the aqueduct or within the fourth ventricle. Chronic global cerebral volume loss and/or acute fluid status may exaggerate this  appearance given time interval from prior examination. 2.  Status post left hemicraniectomy. Electronically Signed   By: Eddie Candle M.D.   On: 07/24/2019 16:28   Dg Chest Port 1 View  Result Date: 07/24/2019 CLINICAL DATA:  Hypotension EXAM: PORTABLE CHEST 1 VIEW COMPARISON:  December 27, 2018. FINDINGS: No edema or consolidation. Heart size and pulmonary vascularity are normal. No adenopathy. Shunt extends along the left hemithorax. There is lower thoracic dextroscoliosis with thoracolumbar levoscoliosis. IMPRESSION: No edema or consolidation. Stable cardiac silhouette. Scoliosis, grossly stable in appearance. Electronically Signed   By: Lowella Grip III M.D.   On: 07/24/2019 11:23    Review of Systems  Constitutional: Positive for weight loss. Negative for chills, diaphoresis, fever and malaise/fatigue.  HENT: Negative for congestion, ear pain, hearing loss, sinus pain, sore throat and tinnitus.   Respiratory: Negative for cough, hemoptysis, sputum production, shortness of breath and wheezing.   Cardiovascular: Negative for chest pain, palpitations, orthopnea, claudication and leg  swelling.  Gastrointestinal: Positive for diarrhea. Negative for abdominal pain, blood in stool, constipation, heartburn, nausea and vomiting.  Genitourinary: Negative for dysuria, flank pain, frequency, hematuria and urgency.  Musculoskeletal: Negative for back pain, falls, joint pain, myalgias and neck pain.  Skin: Negative for itching and rash.  Neurological: Negative for dizziness, tingling, tremors, sensory change, speech change, focal weakness, seizures, loss of consciousness, weakness and headaches.  Endo/Heme/Allergies: Negative.   Psychiatric/Behavioral: Negative.    Blood pressure 112/69, pulse 89, temperature 97.9 F (36.6 C), temperature source Oral, resp. rate 10, height 5\' 11"  (1.803 m), weight 54.4 kg, SpO2 99 %. Physical Exam  Constitutional: He is oriented to person, place, and time. Vital signs are normal. He appears well-nourished. He appears cachectic. He is cooperative.  HENT:  Left parietal craniectomy defect; concave  Eyes: Pupils are equal, round, and reactive to light. Conjunctivae and EOM are normal.  Neck: Normal range of motion. Neck supple.  Cardiovascular: Normal rate and regular rhythm.  Respiratory: Effort normal. No respiratory distress.  GI: Soft. He exhibits no distension. There is no abdominal tenderness.  Genitourinary:    Penis normal.   Musculoskeletal: Normal range of motion.  Neurological: He is alert and oriented to person, place, and time.  Skin: Skin is warm and dry.       Assessment/Plan: CT performed 07/24/2019 demonstrates ventriculomegaly that appears to be in proportion to his atrophy. The patient is alert and interacting at his baseline during this assessment. His altered mental status likely related to his dehydrated state at admission. At this time, there does not appear to be a need for a neurosurgical intervention. The patient can follow up with Dr. Arnoldo Morale as an outpatient. Please re-consult neurosurgery should the patient's  condition change.  Patricia Nettle 07/25/2019, 12:52 PM

## 2019-07-25 NOTE — Progress Notes (Signed)
CRITICAL VALUE ALERT  Critical Value:  Na 168 Chloride >130   Date & Time Notied:  07/25/19 1344  Provider Notified: Family Medicine Teaching Service Dr. Chauncey Reading   Orders Received/Actions taken: no new orders at this time

## 2019-07-25 NOTE — Progress Notes (Signed)
FPTS Interim Progress Note  Have attempted to consult neurosurgery on evening of 9/28 and morning of 9/29.  Awaiting call back.  Danna Hefty, DO 07/25/2019, 10:55 AM PGY-2, [facility] pager [phone]

## 2019-07-25 NOTE — Progress Notes (Signed)
Initial Nutrition Assessment  DOCUMENTATION CODES:   Underweight  INTERVENTION:  Provide Ensure Enlive po TID (thickened to nectar thick consistency), each supplement provides 350 kcal and 20 grams of protein.  Encourage adequate PO intake.   NUTRITION DIAGNOSIS:   Increased nutrient needs related to wound healing as evidenced by estimated needs.  GOAL:   Patient will meet greater than or equal to 90% of their needs  MONITOR:   PO intake, Supplement acceptance, Skin, Weight trends, Labs, I & O's  REASON FOR ASSESSMENT:   Consult Assessment of nutrition requirement/status  ASSESSMENT:   43 y.o. male presenting with hypernatremia, anorexia. PMH is significant for brain tumor, hypertension, intellectual disability, seizures, and visual impairment.  Pt unavailable during attempted time of visit. RD unable to obtain pt nutrition history at this time. Pt is currently on a dysphagia 2 diet with nectar thick liquids. Meal completion 100% at lunch today. Pt with a 32% weight loss per weight records, which is significant for time frame. RD to order nutritional supplements to aid in caloric and protein needs.   Unable to complete Nutrition-Focused physical exam at this time. RD to perform physical exam at next visit.   Labs and medications reviewed. Sodium elevated at 166. Chloride elevated >130. IV fluids infusing.   Diet Order:   Diet Order            DIET DYS 2 Room service appropriate? Yes; Fluid consistency: Nectar Thick  Diet effective now              EDUCATION NEEDS:   Not appropriate for education at this time  Skin:  Skin Assessment: Skin Integrity Issues: Skin Integrity Issues:: Stage II, DTI DTI: R hip Stage II: sacrum  Last BM:  9/29  Height:   Ht Readings from Last 1 Encounters:  07/24/19 5\' 11"  (1.803 m)    Weight:   Wt Readings from Last 1 Encounters:  07/24/19 54.4 kg    Ideal Body Weight:  78 kg  BMI:  Body mass index is 16.74  kg/m.  Estimated Nutritional Needs:   Kcal:  1900-2100  Protein:  95-110 grams  Fluid:  >/= 1.9 L/day    Corrin Parker, MS, RD, LDN Pager # [phone] After hours/ weekend pager # [phone]
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
## 2019-07-26 ENCOUNTER — Inpatient Hospital Stay (HOSPITAL_COMMUNITY): Payer: Medicare Other

## 2019-07-26 LAB — CBC
HCT: 27.3 % — ABNORMAL LOW (ref 39.0–52.0)
Hemoglobin: 8.2 g/dL — ABNORMAL LOW (ref 13.0–17.0)
MCH: 31.4 pg (ref 26.0–34.0)
MCHC: 30 g/dL (ref 30.0–36.0)
MCV: 104.6 fL — ABNORMAL HIGH (ref 80.0–100.0)
Platelets: 102 10*3/uL — ABNORMAL LOW (ref 150–400)
RBC: 2.61 MIL/uL — ABNORMAL LOW (ref 4.22–5.81)
RDW: 16.3 % — ABNORMAL HIGH (ref 11.5–15.5)
WBC: 5.8 10*3/uL (ref 4.0–10.5)
nRBC: 0.5 % — ABNORMAL HIGH (ref 0.0–0.2)

## 2019-07-26 LAB — BASIC METABOLIC PANEL
Anion gap: 9 (ref 5–15)
Anion gap: 7 (ref 5–15)
Anion gap: 7 (ref 5–15)
BUN: 51 mg/dL — ABNORMAL HIGH (ref 6–20)
BUN: 44 mg/dL — ABNORMAL HIGH (ref 6–20)
BUN: 40 mg/dL — ABNORMAL HIGH (ref 6–20)
CO2: 23 mmol/L (ref 22–32)
CO2: 27 mmol/L (ref 22–32)
CO2: 27 mmol/L (ref 22–32)
Calcium: 7.8 mg/dL — ABNORMAL LOW (ref 8.9–10.3)
Calcium: 7.8 mg/dL — ABNORMAL LOW (ref 8.9–10.3)
Calcium: 7.8 mg/dL — ABNORMAL LOW (ref 8.9–10.3)
Chloride: 127 mmol/L — ABNORMAL HIGH (ref 98–111)
Chloride: 126 mmol/L — ABNORMAL HIGH (ref 98–111)
Chloride: 123 mmol/L — ABNORMAL HIGH (ref 98–111)
Creatinine, Ser: 1.14 mg/dL (ref 0.61–1.24)
Creatinine, Ser: 1.01 mg/dL (ref 0.61–1.24)
Creatinine, Ser: 0.93 mg/dL (ref 0.61–1.24)
GFR calc Af Amer: >60 mL/min (ref >60)
GFR calc Af Amer: >60 mL/min (ref >60)
GFR calc Af Amer: >60 mL/min (ref >60)
GFR calc non Af Amer: >60 mL/min (ref >60)
GFR calc non Af Amer: >60 mL/min (ref >60)
GFR calc non Af Amer: >60 mL/min (ref >60)
Glucose, Bld: 79 mg/dL (ref 70–99)
Glucose, Bld: 77 mg/dL (ref 70–99)
Glucose, Bld: 136 mg/dL — ABNORMAL HIGH (ref 70–99)
Potassium: 4.2 mmol/L (ref 3.5–5.1)
Potassium: 3.2 mmol/L — ABNORMAL LOW (ref 3.5–5.1)
Potassium: 3.7 mmol/L (ref 3.5–5.1)
Sodium: 159 mmol/L — ABNORMAL HIGH (ref 135–145)
Sodium: 160 mmol/L — ABNORMAL HIGH (ref 135–145)
Sodium: 157 mmol/L — ABNORMAL HIGH (ref 135–145)

## 2019-07-26 LAB — IRON AND TIBC
Iron: 69 ug/dL (ref 45–182)
Saturation Ratios: 66 % — ABNORMAL HIGH (ref 17.9–39.5)
TIBC: 105 ug/dL — ABNORMAL LOW (ref 250–450)
UIBC: 36 ug/dL

## 2019-07-26 LAB — HEPATIC FUNCTION PANEL
ALT: 21 U/L (ref 0–44)
AST: 62 U/L — ABNORMAL HIGH (ref 15–41)
Albumin: 2.1 g/dL — ABNORMAL LOW (ref 3.5–5.0)
Alkaline Phosphatase: 32 U/L — ABNORMAL LOW (ref 38–126)
Bilirubin, Direct: 0.1 mg/dL (ref 0.0–0.2)
Indirect Bilirubin: 0.9 mg/dL (ref 0.3–0.9)
Total Bilirubin: 1 mg/dL (ref 0.3–1.2)
Total Protein: 5 g/dL — ABNORMAL LOW (ref 6.5–8.1)

## 2019-07-26 LAB — PHOSPHORUS: Phosphorus: 2.6 mg/dL (ref 2.5–4.6)

## 2019-07-26 LAB — MAGNESIUM: Magnesium: 2.3 mg/dL (ref 1.7–2.4)

## 2019-07-26 LAB — FERRITIN: Ferritin: 362 ng/mL — ABNORMAL HIGH (ref 24–336)

## 2019-07-26 MED ORDER — SODIUM CHLORIDE 0.45 % IV SOLN
INTRAVENOUS | Status: DC
  Administered 2019-07-26 – 2019-07-27 (×4): via INTRAVENOUS

## 2019-07-26 MED ORDER — POTASSIUM CHLORIDE CRYS ER 20 MEQ PO TBCR
40.0000 meq | EXTENDED_RELEASE_TABLET | Freq: Two times a day (BID) | ORAL | Status: AC
  Administered 2019-07-26 (×2): 40 meq via ORAL
  Filled 2019-07-26 (×2): qty 2
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
## 2019-07-26 NOTE — Progress Notes (Signed)
Spoke with patient's primary provider, Selina Cooley, NP, regarding patient's baseline status.  She says that he has had a chronic decline over several years.  She says that he was seeing [facility] outpatient, but they discharged him from their office because they said that there was nothing more they could do for him.  Ms. Ouida Sills has been prescribing his Depakote and keeping up with his Depakote levels, which have been study and within normal limits.  His last sodium in her office was 140.  She says that the family does a good job taking care of him to their best ability, and his sister often speaks for him.  I updated her about his hypernatremia, dehydration, and weight loss while in the hospital as well as his need to follow-up with neurosurgery outpatient.  Also recommended that he see neurology again as well, which she agreed with.  Caitlinn Klinker C. Shan Levans, MD PGY-3, [facility] 07/26/2019 2:06 PM

## 2019-07-26 NOTE — Progress Notes (Signed)
Patient is having loose stools.

## 2019-07-26 NOTE — Progress Notes (Signed)
Modified Barium Swallow Progress Note  Patient Details  Name: [name] MRN: [id number] Date of Birth: 21-Feb-1976  Today's Date: 07/26/2019  Modified Barium Swallow completed.  Full report located under Chart Review in the Imaging Section.  Brief recommendations include the following:  Clinical Impression  Pt exhibited oropharyngeal dysphagia characterized by delayed swallow initiation to pyriform sinuses, reduced inversion of epiglottis resulting in penetration, and silent aspiration that was unable to be cleared. Pt required total assist for feeding due to impaired fine motor skills and preference for large sips. Chin tuck tested but not determined to be an effective or reliable strategy for him. Produced unproductive throat clear when modeled, physically unable to produce volitional cough when attempted.  Although inversion of epiglottis was incomplete, laryngeal closure was achieved but timing of laryngeal closure was late with barium having reached level of pyriform sinuses resulting in silent aspiration with thins. Mod residue in valleculae and pyriform sinuses likely due to decreased pharyngeal peristalsis. Nectar via cup had significant delay in swallow initiation with two instances of penetration that were expelled and left no residue. Given nectar via straw pt took large sips requiring SLP to intervene, followed by mod silent aspiration. Puree trials had delay as well as silent aspiration (in one of two trials) from pyriform sinuses on second swallow from residue left after first swallow. Esophageal scan revealed stasis with residue in distal esophagus. Pt reported being on thickened liquids at baseline. Due to mod-severe aspiration risk, recommend Dys 1with Nectar thick liquids, meds crushed in puree. Full staff supervision with assist for self-feeding necessary to ensure pt is taking appropriately sized sips/bites and producing throat clears intermittently. SLP will continue to follow  for diet toleration with recommendation for home health SLP if d/c home.    Swallow Evaluation Recommendations       SLP Diet Recommendations: Dysphagia 1 (Puree) solids;Nectar thick liquid   Liquid Administration via: Cup;No straw   Medication Administration: Crushed with puree   Supervision: Staff to assist with self feeding;Full supervision/cueing for compensatory strategies   Compensations: Minimize environmental distractions;Small sips/bites;Clear throat intermittently   Postural Changes: Seated upright at 90 degrees   Oral Care Recommendations: Oral care BID;Staff/trained caregiver to provide oral care   Other Recommendations: Order thickener from pharmacy    Houston Siren 07/26/2019,12:20 PM  .Orbie Pyo Colvin Caroli.Ed Risk analyst [phone] Office [phone]

## 2019-07-26 NOTE — Progress Notes (Addendum)
Family Medicine Teaching Service Daily Progress Note Intern Pager: [phone]  Patient name: [name] Medical record number: [id number] Date of birth: [birth year] Age: 43 y.o. Gender: male  Primary Care Provider: Patient, No Pcp Per Consultants: Neurosurgery Code Status: Full  Pt Overview and Major Events to Date:  9/29- admitted, CT VP malfunction  Assessment and Plan: [name] is a 43 y.o. male presenting with hypernatremia, anorexia. PMH is significant for brain tumor, hypertension, intellectual disability, seizures, and visual impairment.  Hypernatremia  Dehydration  Failure to Thrive Continues to be improved and at mentation baseline.  AO x4, speech is slow he responds correctly and appropriately to conversation. Sodium has decreased to 159, chloride is 127.  Will continue hydration with 1/2 NS at 200 mL/h.  Refeeding labs appropriate phosphorus 2.6, magnesium 2.3.  Neurosurgery evaluated patient and states that ventricles enlarged due to brain atrophy and suggest to see patient is follow-up in the outpatient setting, no acute interventions needed.  Chronic brain atrophy may explain his insidious decline, he may need more work-up in the outpatient setting, can consider neurology consult.  Will attempt to contact his PCP, Scott Cooley, NP, to assess most recent medical history outside the hospital.  - Admit to [facility], attending Dr. Ardelia Mems - IVF @ 200 mL/hr 1/2 NS - Refeeding labs daily - PT/OT, nutrition - SW consult - Q4H BMP - AM CBC, BMP - Lovenox for DVT ppx  AKI- improved Cr improved today to 1.14. Previous labs revealed baseline around 1.5 approx. six months ago.  - Continue fluid resuscitation - Daily BMP  Pressure ulcers To right elbow, right dorsum of hand, right hip, sacrum.  Very superficial, beginning of stage II. No evidence of infection. Bandages applied.  Will consult wound nursing, to see pt today. - Consult wound care  HTN Home  meds include Benicar.  Most recent blood pressure is normotensive at 105/64. -We will monitor, hold home meds for now  Seizures Home medication includes Depakote.  Seizures are sometimes tonic-clonic and sometimes absence in nature.  His last seizure was approximately 9 years ago, he has not been to see neurology in 3 years.  Patient has mildly elevated AST to 78, will discuss with team if Depakote is appropriate choice at this time. -Home medication Depakote  FEN/GI: Dysphagia II diet PPx: lovenox  Disposition: Patient requires inpatient hospitalization for correction of hypernatremia and further medical work-up  Subjective:  Patient is comfortably laying in bed today.  He is alert, awake, oriented x4.  Able to hold a very normal conversation, his speech is just slow.  He states he looks forward to feeling better, and looks forward to eating his Magic cup.  Objective: Temp:  [97.5 F (36.4 C)-98.2 F (36.8 C)] 97.6 F (36.4 C) (09/30 0814) Pulse Rate:  [72-89] 72 (09/30 0814) Resp:  [8-16] 16 (09/30 0814) BP: (104-113)/(64-69) 113/67 (09/30 0814) SpO2:  [97 %-99 %] 98 % (09/30 0814) Physical Exam: General: Very pleasant man, cachectic, sitting comfortably in bed, NAD Cardiovascular: RRR, no M/R/G Respiratory: Clear to auscultation bilaterally Abdomen: Scaphoid appearance, soft, nontender, nondistended Extremities: Skin tearing present at right elbow, right dorsum of hand, right hip, sacrum.  No erythema, warmth, or pustulant discharge.  Laboratory: Recent Labs  Lab 07/24/19 1014 07/25/19 0351  WBC 6.4 5.8  HGB 12.7* 10.4*  HCT 42.5 33.7*  PLT 139* 104*   Recent Labs  Lab 07/24/19 1014  07/25/19 1207 07/25/19 1613 07/25/19 2025 07/26/19 0101  NA 171*   < >  168* 166* 163* 159*  K 4.0   < > 3.6 4.8 3.5 4.2  CL 127*   < > >130* >130* >130* 127*  CO2 31   < > 27 26 27 23   BUN 81*   < > 61* 59* 55* 51*  CREATININE 2.50*   < > 1.31* 1.34* 1.17 1.14  CALCIUM 9.2   < >  7.6* 7.8* 7.7* 7.8*  PROT 7.7  --  5.5*  --   --   --   BILITOT 0.6  --  0.5  --   --   --   ALKPHOS 47  --  36*  --   --   --   ALT 23  --  23  --   --   --   AST 73*  --  78*  --   --   --   GLUCOSE 116*   < > 89 94 113* 79   < > = values in this interval not displayed.    Imaging/Diagnostic Tests: Ct Head Wo Contrast  Result Date: 07/24/2019 CLINICAL DATA:  Altered level of consciousness EXAM: CT HEAD WITHOUT CONTRAST TECHNIQUE: Contiguous axial images were obtained from the base of the skull through the vertex without intravenous contrast. 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 There has been interval increase in caliber of the lateral and third ventricles when compared to the prior examination dated 07/28/2017. Incidental note of cavum septum pellucidum variant of the lateral ventricles. Vascular: No hyperdense vessel or unexpected calcification. Skull: Left hemicraniectomy.  Negative for fracture or focal lesion. Sinuses/Orbits: No acute finding. Other: None. IMPRESSION: 1. 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 There has been interval increase in caliber of the lateral and third ventricles when compared to the prior examination dated 07/28/2017, concerning for shunt catheter malfunction. No obvious obstructing lesion along the aqueduct or within the fourth ventricle. Chronic global cerebral volume loss and/or acute fluid status may exaggerate this appearance given time interval from prior examination. 2.  Status post left hemicraniectomy. Electronically Signed   By: Eddie Candle M.D.   On: 07/24/2019 16:28   Dg Chest Port 1 View  Result Date: 07/24/2019 CLINICAL DATA:  Hypotension EXAM: PORTABLE  CHEST 1 VIEW COMPARISON:  December 27, 2018. FINDINGS: No edema or consolidation. Heart size and pulmonary vascularity are normal. No adenopathy. Shunt extends along the left hemithorax. There is lower thoracic dextroscoliosis with thoracolumbar levoscoliosis. IMPRESSION: No edema or consolidation. Stable cardiac silhouette. Scoliosis, grossly stable in appearance. Electronically Signed   By: Lowella Grip III M.D.   On: 07/24/2019 11:23   Gladys Damme, MD 07/26/2019, 8:21 AM PGY-1, [place] Intern pager: [phone], text pages welcome

## 2019-07-26 NOTE — Progress Notes (Signed)
Subjective: The patient is alert and pleasant.  He has no complaints.  He denies headache/pain.  Objective: Vital signs in last 24 hours: Temp:  [97.5 F (36.4 C)-98.2 F (36.8 C)] 97.5 F (36.4 C) (09/29 2339) Pulse Rate:  [81-89] 83 (09/29 1604) Resp:  [8-10] 9 (09/29 1604) BP: (104-105)/(64-69) 105/64 (09/29 1604) SpO2:  [97 %-99 %] 99 % (09/29 1604) Estimated body mass index is 16.74 kg/m as calculated from the following:   Height as of this encounter: 5\' 11"  (1.803 m).   Weight as of this encounter: 54.4 kg.   Intake/Output from previous day: 09/29 0701 - 09/30 0700 In: 2460 [P.O.:460; I.V.:2000] Out: 600 [Urine:600] Intake/Output this shift: No intake/output data recorded.  Physical exam the patient is alert and oriented.  His speech is dysarthric.  His craniectomy defect is soft and sunken.  Lab Results: Recent Labs    07/24/19 1014 07/25/19 0351  WBC 6.4 5.8  HGB 12.7* 10.4*  HCT 42.5 33.7*  PLT 139* 104*   BMET Recent Labs    07/25/19 2025 07/26/19 0101  NA 163* 159*  K 3.5 4.2  CL >130* 127*  CO2 27 23  GLUCOSE 113* 79  BUN 55* 51*  CREATININE 1.17 1.14  CALCIUM 7.7* 7.8*    Studies/Results: Ct Head Wo Contrast  Result Date: 07/24/2019 CLINICAL DATA:  Altered level of consciousness EXAM: CT HEAD WITHOUT CONTRAST TECHNIQUE: Contiguous axial images were obtained from the base of the skull through the vertex without intravenous contrast. 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 There has been interval increase in caliber of the lateral and third ventricles when compared to the prior examination dated 07/28/2017. Incidental note of cavum septum pellucidum variant of the lateral ventricles. Vascular: No hyperdense vessel or unexpected calcification. Skull: Left hemicraniectomy.  Negative  for fracture or focal lesion. Sinuses/Orbits: No acute finding. Other: None. IMPRESSION: 1. 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 There has been interval increase in caliber of the lateral and third ventricles when compared to the prior examination dated 07/28/2017, concerning for shunt catheter malfunction. No obvious obstructing lesion along the aqueduct or within the fourth ventricle. Chronic global cerebral volume loss and/or acute fluid status may exaggerate this appearance given time interval from prior examination. 2.  Status post left hemicraniectomy. Electronically Signed   By: Eddie Candle M.D.   On: 07/24/2019 16:28   Dg Chest Port 1 View  Result Date: 07/24/2019 CLINICAL DATA:  Hypotension EXAM: PORTABLE CHEST 1 VIEW COMPARISON:  December 27, 2018. FINDINGS: No edema or consolidation. Heart size and pulmonary vascularity are normal. No adenopathy. Shunt extends along the left hemithorax. There is lower thoracic dextroscoliosis with thoracolumbar levoscoliosis. IMPRESSION: No edema or consolidation. Stable cardiac silhouette. Scoliosis, grossly stable in appearance. Electronically Signed   By: Lowella Grip III M.D.   On: 07/24/2019 11:23    Assessment/Plan: Ventricular enlargement: He does not appear that he needs to have his shunt revised.  He seems to be at his baseline.  His ventricles are slightly enlarged but in proportion to his brain atrophy.  His scalp is sunken.  I will sign off.  Please have him follow-up with me in the office.  LOS: 2 days     Ophelia Charter 07/26/2019, 8:07 AM  Patient ID: [name], male   DOB: 06-28-1976, 43 y.o.   MRN: [id number]

## 2019-07-27 DIAGNOSIS — R627 Adult failure to thrive: Secondary | ICD-10-CM

## 2019-07-27 LAB — PHOSPHORUS: Phosphorus: 2.1 mg/dL — ABNORMAL LOW (ref 2.5–4.6)

## 2019-07-27 LAB — BASIC METABOLIC PANEL
Anion gap: 4 — ABNORMAL LOW (ref 5–15)
Anion gap: 6 (ref 5–15)
Anion gap: 8 (ref 5–15)
Anion gap: 7 (ref 5–15)
BUN: 32 mg/dL — ABNORMAL HIGH (ref 6–20)
BUN: 27 mg/dL — ABNORMAL HIGH (ref 6–20)
BUN: 24 mg/dL — ABNORMAL HIGH (ref 6–20)
BUN: 23 mg/dL — ABNORMAL HIGH (ref 6–20)
CO2: 27 mmol/L (ref 22–32)
CO2: 25 mmol/L (ref 22–32)
CO2: 23 mmol/L (ref 22–32)
CO2: 26 mmol/L (ref 22–32)
Calcium: 7.6 mg/dL — ABNORMAL LOW (ref 8.9–10.3)
Calcium: 7.4 mg/dL — ABNORMAL LOW (ref 8.9–10.3)
Calcium: 7.5 mg/dL — ABNORMAL LOW (ref 8.9–10.3)
Calcium: 7.5 mg/dL — ABNORMAL LOW (ref 8.9–10.3)
Chloride: 124 mmol/L — ABNORMAL HIGH (ref 98–111)
Chloride: 124 mmol/L — ABNORMAL HIGH (ref 98–111)
Chloride: 120 mmol/L — ABNORMAL HIGH (ref 98–111)
Chloride: 120 mmol/L — ABNORMAL HIGH (ref 98–111)
Creatinine, Ser: 0.96 mg/dL (ref 0.61–1.24)
Creatinine, Ser: 0.94 mg/dL (ref 0.61–1.24)
Creatinine, Ser: 0.78 mg/dL (ref 0.61–1.24)
Creatinine, Ser: 0.89 mg/dL (ref 0.61–1.24)
GFR calc Af Amer: >60 mL/min (ref >60)
GFR calc Af Amer: >60 mL/min (ref >60)
GFR calc Af Amer: >60 mL/min (ref >60)
GFR calc Af Amer: >60 mL/min (ref >60)
GFR calc non Af Amer: >60 mL/min (ref >60)
GFR calc non Af Amer: >60 mL/min (ref >60)
GFR calc non Af Amer: >60 mL/min (ref >60)
GFR calc non Af Amer: >60 mL/min (ref >60)
Glucose, Bld: 112 mg/dL — ABNORMAL HIGH (ref 70–99)
Glucose, Bld: 86 mg/dL (ref 70–99)
Glucose, Bld: 95 mg/dL (ref 70–99)
Glucose, Bld: 86 mg/dL (ref 70–99)
Potassium: 3.6 mmol/L (ref 3.5–5.1)
Potassium: 3.5 mmol/L (ref 3.5–5.1)
Potassium: 3.2 mmol/L — ABNORMAL LOW (ref 3.5–5.1)
Potassium: 3.3 mmol/L — ABNORMAL LOW (ref 3.5–5.1)
Sodium: 155 mmol/L — ABNORMAL HIGH (ref 135–145)
Sodium: 155 mmol/L — ABNORMAL HIGH (ref 135–145)
Sodium: 151 mmol/L — ABNORMAL HIGH (ref 135–145)
Sodium: 153 mmol/L — ABNORMAL HIGH (ref 135–145)

## 2019-07-27 LAB — CBC
HCT: 26.2 % — ABNORMAL LOW (ref 39.0–52.0)
Hemoglobin: 8.3 g/dL — ABNORMAL LOW (ref 13.0–17.0)
MCH: 31.4 pg (ref 26.0–34.0)
MCHC: 31.7 g/dL (ref 30.0–36.0)
MCV: 99.2 fL (ref 80.0–100.0)
Platelets: 101 10*3/uL — ABNORMAL LOW (ref 150–400)
RBC: 2.64 MIL/uL — ABNORMAL LOW (ref 4.22–5.81)
RDW: 15.9 % — ABNORMAL HIGH (ref 11.5–15.5)
WBC: 8.3 10*3/uL (ref 4.0–10.5)
nRBC: 0.8 % — ABNORMAL HIGH (ref 0.0–0.2)

## 2019-07-27 LAB — OSMOLALITY: Osmolality: 323 mOsm/kg (ref 275–295)

## 2019-07-27 LAB — URINE CULTURE: Culture: 40000 — AB

## 2019-07-27 LAB — MAGNESIUM: Magnesium: 2 mg/dL (ref 1.7–2.4)

## 2019-07-27 MED ORDER — K PHOS MONO-SOD PHOS DI & MONO 155-852-130 MG PO TABS
500.0000 mg | ORAL_TABLET | Freq: Two times a day (BID) | ORAL | Status: AC
  Administered 2019-07-27 (×2): 500 mg via ORAL
  Filled 2019-07-27 (×2): qty 2

## 2019-07-27 NOTE — Progress Notes (Addendum)
Family Medicine Teaching Service Daily Progress Note Intern Pager: [phone]  Patient name: [name] Medical record number: [id number] Date of birth: 05/12/1976 Age: 43 y.o. Gender: male  Primary Care Provider: Patient, No Pcp Per Consultants: Neurosurgery Code Status: Full  Pt Overview and Major Events to Date:  9/29- admitted, CT VP malfunction  Assessment and Plan: [name] is a 43 y.o. male presenting with hypernatremia, anorexia. PMH is significant for brain tumor, hypertension, intellectual disability, seizures, and visual impairment.  Hypernatremia  Dehydration  Failure to Thrive Patient AO x3, speech is slow he responds correctly and appropriately to conversation. Sodium has decreased to 155, chloride is 124.  Will continue hydration with 1/2 NS at 200 mL/h.  Refeeding labs phosphorus 2.1, magnesium 2.0.  Neurosurgery evaluated patient and states that ventricles enlarged due to brain atrophy and suggest to see patient is follow-up in the outpatient setting, no acute interventions needed.  Chronic brain atrophy may explain his insidious decline, he may need more work-up in the outpatient setting, can consider neurology consult.  - IVF @ 200 mL/hr 1/2 NS - Refeeding labs daily - Replace Phos with K-phos - PT/OT, nutrition - SW consult - Q4H BMP - AM CBC, BMP - Lovenox for DVT ppx  AKI- improved Cr improved today, 10/1 to 0.96. Previous labs revealed baseline around 1.5 approx. six months ago.  - Continue fluid resuscitation - Daily BMP  Pressure ulcers To right elbow, right dorsum of hand, right hip, sacrum.  Very superficial, beginning of stage II. No evidence of infection. Bandages applied.   - Consult wound care  HTN Home meds include Benicar.  Most recent blood pressure is normotensive at 105/64. -We will monitor, hold home meds for now  Seizures Home medication includes Depakote.  Seizures are sometimes tonic-clonic and sometimes absence in  nature.  His last seizure was approximately 9 years ago, he has not been to see neurology in 3 years.  Patient has mildly elevated AST to 78, now 62. -Home medication Depakote  FEN/GI: Dysphagia II diet PPx: lovenox  Disposition: Patient requires inpatient hospitalization for correction of hypernatremia and further medical work-up  Subjective:  Patient with no complaints this morning, resting comfortably in bed.  Patient slow to speak but is alert and oriented to person, place, time.  Objective: Temp:  [98 F (36.7 C)-98.9 F (37.2 C)] 98.2 F (36.8 C) (10/01 0800) Pulse Rate:  [59-79] 59 (10/01 0800) Resp:  [9-17] 9 (10/01 0800) BP: (101-110)/(54-64) 103/57 (10/01 0800) SpO2:  [98 %-100 %] 100 % (10/01 0800) Physical Exam:  General: Alert and oriented to person place and time, no apparent distress, cachectic in appearance. Heart: Regular rate and rhythm with no murmurs appreciated Lungs: CTA bilaterally, no wheezing Abdomen: Scaphoid appearing. Soft and nontender, bowel sounds present.  Laboratory: Recent Labs  Lab 07/24/19 1014 07/25/19 0351 07/26/19 0715  WBC 6.4 5.8 5.8  HGB 12.7* 10.4* 8.2*  HCT 42.5 33.7* 27.3*  PLT 139* 104* 102*   Recent Labs  Lab 07/24/19 1014  07/25/19 1207  07/26/19 0715 07/26/19 2017 07/27/19 0030 07/27/19 0608  NA 171*   < > 168*   < > 160* 157* 155* 155*  K 4.0   < > 3.6   < > 3.2* 3.7 3.6 3.5  CL 127*   < > >130*   < > 126* 123* 124* 124*  CO2 31   < > 27   < > 27 27 27 25   BUN 81*   < >  61*   < > 44* 40* 32* 27*  CREATININE 2.50*   < > 1.31*   < > 1.01 0.93 0.96 0.94  CALCIUM 9.2   < > 7.6*   < > 7.8* 7.8* 7.6* 7.4*  PROT 7.7  --  5.5*  --  5.0*  --   --   --   BILITOT 0.6  --  0.5  --  1.0  --   --   --   ALKPHOS 47  --  36*  --  32*  --   --   --   ALT 23  --  23  --  21  --   --   --   AST 73*  --  78*  --  62*  --   --   --   GLUCOSE 116*   < > 89   < > 77 136* 112* 86   < > = values in this interval not displayed.     Imaging/Diagnostic Tests: Ct Head Wo Contrast  Result Date: 07/24/2019 CLINICAL DATA:  Altered level of consciousness EXAM: CT HEAD WITHOUT CONTRAST TECHNIQUE: Contiguous axial images were obtained from the base of the skull through the vertex without intravenous contrast. 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 There has been interval increase in caliber of the lateral and third ventricles when compared to the prior examination dated 07/28/2017. Incidental note of cavum septum pellucidum variant of the lateral ventricles. Vascular: No hyperdense vessel or unexpected calcification. Skull: Left hemicraniectomy.  Negative for fracture or focal lesion. Sinuses/Orbits: No acute finding. Other: None. IMPRESSION: 1. 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 There has been interval increase in caliber of the lateral and third ventricles when compared to the prior examination dated 07/28/2017, concerning for shunt catheter malfunction. No obvious obstructing lesion along the aqueduct or within the fourth ventricle. Chronic global cerebral volume loss and/or acute fluid status may exaggerate this appearance given time interval from prior examination. 2.  Status post left hemicraniectomy. Electronically Signed   By: Eddie Candle M.D.   On: 07/24/2019 16:28   Dg Chest Port 1 View  Result Date: 07/24/2019 CLINICAL DATA:  Hypotension EXAM: PORTABLE CHEST 1 VIEW COMPARISON:  December 27, 2018. FINDINGS: No edema or consolidation. Heart size and pulmonary vascularity are normal. No adenopathy. Shunt extends along the left hemithorax. There is lower thoracic dextroscoliosis with thoracolumbar levoscoliosis. IMPRESSION: No edema  or consolidation. Stable cardiac silhouette. Scoliosis, grossly stable in appearance. Electronically Signed   By: Lowella Grip III M.D.   On: 07/24/2019 11:23   Dg Swallowing Func-speech Pathology  Result Date: 07/26/2019 Objective Swallowing Evaluation: Type of Study: MBS-Modified Barium Swallow Study  Patient Details Name: [name] MRN: [id number] Date of Birth: Oct 01, 1976 Today's Date: 07/26/2019 Time: SLP Start Time (ACUTE ONLY): 0909 -SLP Stop Time (ACUTE ONLY): 0936 SLP Time Calculation (min) (ACUTE ONLY): 27 min Past Medical History: Past Medical History: Diagnosis Date . Brain tumor (benign) ([place])  . Hypertension  . Mental retardation, mild (I.Q. 50-70)  . Seizures ([place])  Past Surgical History: Past Surgical History: Procedure Laterality Date . BRAIN SURGERY   HPI: [name] a 43 y.o.malewithpast medical history significant for L brain tumor removal via craniectomy, hypertension, mental retardation, seizures, scoliosis, and visual  impairment who presents for evaluation of failure to thrive. His sister reports that in the last 2 years patient has gradually declined, with steadily worsening mentation and steady weight loss. In 2018 patient weighed 68 kg, which reveals an overall weight loss of about 30 pounds in the interim. Patient states that he frequently is not hungry, but he cannot explain why that is.  He denies any acid reflux, pain with eating, nausea, and vomiting. 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 Currently NPO.  No data recorded Assessment / Plan / Recommendation CHL IP CLINICAL IMPRESSIONS 07/26/2019 Clinical Impression Pt exhibited oropharyngeal dysphagia characterized by delayed swallow initiation to pyriform sinuses, reduced inversion of epiglottis resulting in penetration, and silent aspiration that was unable to be cleared. Pt required total assist for feeding due to impaired fine motor skills and  preference for large sips. Chin tuck tested but not determined to be an effective or reliable strategy for him. Produced unproductive throat clear when modeled, physically unable to produce volitional cough when attempted.  Although inversion of epiglottis was incomplete, laryngeal closure was achieved but timing of laryngeal closure was late with barium having reached level of pyriform sinuses resulting in silent aspiration with thins. Mod residue in valleculae and pyriform sinuses likely due to decreased pharyngeal peristalsis. Nectar via cup had significant delay in swallow initiation with two instances of penetration that were expelled and left no residue. Given nectar via straw pt took large sips requiring SLP to intervene, followed by mod silent aspiration. Puree trials had delay as well as silent aspiration (in one of two trials) from pyriform sinuses on second swallow from residue left after first swallow. Esophageal scan revealed stasis with residue in distal esophagus. Pt reported being on thickened liquids at baseline. Due to mod-severe aspiration risk, recommend Dys 1with Nectar thick liquids, meds crushed in puree. Full staff supervision with assist for self-feeding necessary to ensure pt is taking appropriately sized sips/bites and producing throat clears intermittently. SLP will continue to follow for diet toleration with recommendation for home health SLP if d/c home.  SLP Visit Diagnosis Dysphagia, oropharyngeal phase (R13.12) Attention and concentration deficit following -- Frontal lobe and executive function deficit following -- Impact on safety and function Moderate aspiration risk;Severe aspiration risk   CHL IP TREATMENT RECOMMENDATION 07/26/2019 Treatment Recommendations Therapy as outlined in treatment plan below   Prognosis 07/26/2019 Prognosis for Safe Diet Advancement Guarded Barriers to Reach Goals Cognitive deficits Barriers/Prognosis Comment -- CHL IP DIET RECOMMENDATION 07/26/2019 SLP  Diet Recommendations Dysphagia 1 (Puree) solids;Nectar thick liquid Liquid Administration via Cup;No straw Medication Administration Crushed with puree Compensations Minimize environmental distractions;Small sips/bites;Clear throat intermittently Postural Changes Seated upright at 90 degrees   CHL IP OTHER RECOMMENDATIONS 07/26/2019 Recommended Consults -- Oral Care Recommendations Oral care BID;Staff/trained caregiver to provide oral care Other Recommendations Order thickener from pharmacy   CHL IP FOLLOW UP RECOMMENDATIONS 07/26/2019 Follow up Recommendations 24 hour supervision/assistance   CHL IP FREQUENCY AND DURATION 07/26/2019 Speech Therapy Frequency (ACUTE ONLY) min 1 x/week Treatment Duration 1 week      CHL IP ORAL PHASE 07/26/2019 Oral Phase Impaired Oral - Pudding Teaspoon NT Oral - Pudding Cup -- Oral - Honey Teaspoon -- Oral - Honey Cup -- Oral - Nectar Teaspoon NT Oral - Nectar Cup Decreased bolus cohesion Oral - Nectar Straw -- Oral - Thin Teaspoon -- Oral - Thin Cup Decreased bolus cohesion Oral - Thin Straw -- Oral - Puree Lingual  pumping;Premature spillage;Delayed oral transit Oral - Mech Soft Delayed oral transit;Lingual pumping Oral - Regular -- Oral - Multi-Consistency -- Oral - Pill -- Oral Phase - Comment --  CHL IP PHARYNGEAL PHASE 07/26/2019 Pharyngeal Phase Impaired Pharyngeal- Pudding Teaspoon -- Pharyngeal -- Pharyngeal- Pudding Cup -- Pharyngeal -- Pharyngeal- Honey Teaspoon -- Pharyngeal -- Pharyngeal- Honey Cup -- Pharyngeal -- Pharyngeal- Nectar Teaspoon -- Pharyngeal -- Pharyngeal- Nectar Cup Reduced epiglottic inversion;Delayed swallow initiation-pyriform sinuses;Penetration/Aspiration during swallow;Pharyngeal residue - valleculae;Pharyngeal residue - pyriform Pharyngeal Material enters airway, remains ABOVE vocal cords and not ejected out;Material enters airway, remains ABOVE vocal cords then ejected out Pharyngeal- Nectar Straw Reduced epiglottic inversion;Penetration/Aspiration  during swallow;Moderate aspiration Pharyngeal Material enters airway, passes BELOW cords without attempt by patient to eject out (silent aspiration) Pharyngeal- Thin Teaspoon -- Pharyngeal -- Pharyngeal- Thin Cup Pharyngeal residue - valleculae;Pharyngeal residue - pyriform;Reduced epiglottic inversion;Delayed swallow initiation-pyriform sinuses;Penetration/Aspiration during swallow;Trace aspiration;Compensatory strategies attempted (with notebox) Pharyngeal Material enters airway, passes BELOW cords without attempt by patient to eject out (silent aspiration) Pharyngeal- Thin Straw -- Pharyngeal -- Pharyngeal- Puree Moderate aspiration;Penetration/Aspiration during swallow;Reduced airway/laryngeal closure;Reduced epiglottic inversion;Delayed swallow initiation-vallecula Pharyngeal Material enters airway, passes BELOW cords without attempt by patient to eject out (silent aspiration) Pharyngeal- Mechanical Soft Reduced pharyngeal peristalsis;Pharyngeal residue - pyriform;Pharyngeal residue - valleculae;Pharyngeal residue - posterior pharnyx;Delayed swallow initiation-vallecula Pharyngeal -- Pharyngeal- Regular -- Pharyngeal -- Pharyngeal- Multi-consistency -- Pharyngeal -- Pharyngeal- Pill -- Pharyngeal -- Pharyngeal Comment --  CHL IP CERVICAL ESOPHAGEAL PHASE 07/26/2019 Cervical Esophageal Phase WFL Pudding Teaspoon -- Pudding Cup -- Honey Teaspoon -- Honey Cup -- Nectar Teaspoon -- Nectar Cup -- Nectar Straw -- Thin Teaspoon -- Thin Cup -- Thin Straw -- Puree -- Mechanical Soft -- Regular -- Multi-consistency -- Pill -- Cervical Esophageal Comment -- [place] Siren 07/26/2019, 12:19 PM Orbie Pyo Litaker M.Ed Actor Pager [phone] Office [phone]              Lurline Del, DO 07/27/2019, 9:34 AM PGY-1, [place] Intern pager: [phone], text pages welcome

## 2019-07-27 NOTE — Progress Notes (Signed)
Occupational Therapy Treatment Patient Details Name: [name] MRN: [id number] DOB: 08-11-1976 Today's Date: 07/27/2019    History of present illness [name] is a 43 y.o. male presenting with hypernatremia, anorexia. PMH is significant for brain tumor, hypertension, intellectual disability, seizures, and visual impairment.   OT comments  Pt progressing toward stated goals. Focused session on mobility progression in preparation for BADL this date. Pt requiring max A for bed mobility with max A +2 for squat pivot to chair. Pt with baseline cognitive/neuro impairments and presenting with RUE synergistic pattern. Will continue to assess for appropriateness of splint or palm protector. D/c rec remain appropriate, will continue to follow acutely.    Follow Up Recommendations  Home health OT    Equipment Recommendations  None recommended by OT    Recommendations for Other Services      Precautions / Restrictions Precautions Precautions: Fall Restrictions Weight Bearing Restrictions: No       Mobility Bed Mobility Overal bed mobility: Needs Assistance Bed Mobility: Supine to Sit     Supine to sit: Max assist     General bed mobility comments: requiring assist for step by step BLEs to EOB, able to reach for railing with cueing but max A to elevate trunk  Transfers Overall transfer level: Needs assistance   Transfers: Squat Pivot Transfers     Squat pivot transfers: Max assist;+2 physical assistance     General transfer comment: maxA + 2 for squat pivot transfer towards right    Balance Overall balance assessment: Needs assistance Sitting-balance support: No upper extremity supported;Feet supported Sitting balance-Leahy Scale: Poor Sitting balance - Comments: Pt required support to maintain upright sitting posture, decreased truncal coordination/strength Postural control: Left lateral lean                                 ADL either  performed or assessed with clinical judgement   ADL Overall ADL's : Needs assistance/impaired                                     Functional mobility during ADLs: Total assistance;+2 for physical assistance(SPT) General ADL Comments: focused session on mobility progression and positioning for BADLs     Vision   Additional Comments: baseline visual deficit, unsure   Perception     Praxis      Cognition Arousal/Alertness: Awake/alert Behavior During Therapy: Impulsive;WFL for tasks assessed/performed Overall Cognitive Status: History of cognitive impairments - at baseline                                 General Comments: Pt presents with cognitive impairments at basline; intellectual disability. Overall, very pleasant and follows simple commands.        Exercises     Shoulder Instructions       General Comments      Pertinent Vitals/ Pain       Pain Assessment: No/denies pain  Home Living                                          Prior Functioning/Environment              Frequency  Min 2X/week        Progress Toward Goals  OT Goals(current goals can now be found in the care plan section)  Progress towards OT goals: Progressing toward goals  Acute Rehab OT Goals Patient Stated Goal: get to the chair OT Goal Formulation: With patient Time For Goal Achievement: 08/08/19 Potential to Achieve Goals: Fair  Plan      Co-evaluation    PT/OT/SLP Co-Evaluation/Treatment: Yes Reason for Co-Treatment: For patient/therapist safety;Necessary to address cognition/behavior during functional activity;To address functional/ADL transfers PT goals addressed during session: Mobility/safety with mobility OT goals addressed during session: ADL's and self-care      AM-PAC OT "6 Clicks" Daily Activity     Outcome Measure   Help from another person eating meals?: A Lot Help from another person taking care of personal  grooming?: A Lot Help from another person toileting, which includes using toliet, bedpan, or urinal?: Total Help from another person bathing (including washing, rinsing, drying)?: A Lot Help from another person to put on and taking off regular upper body clothing?: A Lot Help from another person to put on and taking off regular lower body clothing?: Total 6 Click Score: 10    End of Session Equipment Utilized During Treatment: Gait belt  OT Visit Diagnosis: Cognitive communication deficit (R41.841);Muscle weakness (generalized) (M62.81);Adult, failure to thrive (R62.7);Other symptoms and signs involving cognitive function Symptoms and signs involving cognitive functions: (baseline cognitive impairment)   Activity Tolerance Patient tolerated treatment well   Patient Left in chair;with call bell/phone within reach;with chair alarm set   Nurse Communication Mobility status;Need for lift equipment        Time: 1001-1029 OT Time Calculation (min): 28 min  Charges: OT General Charges $OT Visit: 1 Visit OT Treatments $Self Care/Home Management : 8-22 mins  Zenovia Jarred, MSOT, OTR/L [place] Office: [phone]  Zenovia Jarred 07/27/2019, 1:19 PM

## 2019-07-27 NOTE — Progress Notes (Signed)
Physical Therapy Treatment Patient Details Name: [name] MRN: [id number] DOB: [birth year] Today's Date: 07/27/2019    History of Present Illness [name] is a 43 y.o. male presenting with hypernatremia, anorexia. PMH is significant for brain tumor, hypertension, intellectual disability, seizures, and visual impairment.    PT Comments    Pt agreeable for out of bed mobility to chair. Requiring two person maximal assist for squat pivot transfer. Maxi move lift pad placed for staff transfer back to bed. Pt displays balance impairments, decreased coordination, range of motion, weakness. Will continue to benefit from acute PT.    Follow Up Recommendations  Home health PT;Supervision/Assistance - 24 hour     Equipment Recommendations  Other (comment)(air mattress overlay for hospital bed)    Recommendations for Other Services       Precautions / Restrictions Precautions Precautions: Fall Restrictions Weight Bearing Restrictions: No    Mobility  Bed Mobility Overal bed mobility: Needs Assistance Bed Mobility: Supine to Sit     Supine to sit: Max assist     General bed mobility comments: Requiring assist to bring legs to edge of bed, able to reach for railing, maxA for trunk elevation  Transfers Overall transfer level: Needs assistance   Transfers: Squat Pivot Transfers     Squat pivot transfers: Max assist;+2 physical assistance     General transfer comment: maxA + 2 for squat pivot transfer towards right  Ambulation/Gait             General Gait Details: unable   Stairs             Wheelchair Mobility    Modified Rankin (Stroke Patients Only)       Balance Overall balance assessment: Needs assistance Sitting-balance support: No upper extremity supported;Feet supported Sitting balance-Leahy Scale: Poor Sitting balance - Comments: Pt required support to maintain upright sitting posture, decreased truncal  coordination/strength Postural control: Left lateral lean                                  Cognition Arousal/Alertness: Awake/alert Behavior During Therapy: Impulsive Overall Cognitive Status: History of cognitive impairments - at baseline                                 General Comments: Pt presents with cognitive impairments at basline. Overall, very pleasant and follows simple commands.       Exercises      General Comments        Pertinent Vitals/Pain Pain Assessment: No/denies pain    Home Living                      Prior Function            PT Goals (current goals can now be found in the care plan section) Acute Rehab PT Goals Patient Stated Goal: "get up on my feet" Potential to Achieve Goals: Fair    Frequency    Min 3X/week      PT Plan Current plan remains appropriate    Co-evaluation PT/OT/SLP Co-Evaluation/Treatment: Yes Reason for Co-Treatment: Complexity of the patient's impairments (multi-system involvement);For patient/therapist safety;To address functional/ADL transfers PT goals addressed during session: Mobility/safety with mobility        AM-PAC PT "6 Clicks" Mobility   Outcome Measure  Help needed turning from your back to  your side while in a flat bed without using bedrails?: A Lot Help needed moving from lying on your back to sitting on the side of a flat bed without using bedrails?: Total Help needed moving to and from a bed to a chair (including a wheelchair)?: Total Help needed standing up from a chair using your arms (e.g., wheelchair or bedside chair)?: Total Help needed to walk in hospital room?: Total Help needed climbing 3-5 steps with a railing? : Total 6 Click Score: 7    End of Session Equipment Utilized During Treatment: Gait belt Activity Tolerance: Patient tolerated treatment well Patient left: in chair;with call bell/phone within reach;with chair alarm set Nurse Communication:  Mobility status PT Visit Diagnosis: Other abnormalities of gait and mobility (R26.89);Muscle weakness (generalized) (M62.81)     Time: 1000-1029 PT Time Calculation (min) (ACUTE ONLY): 29 min  Charges:  $Therapeutic Activity: 8-22 mins                     Ellamae Sia, PT, DPT Acute Rehabilitation Services Pager [phone] Office [phone]    Willy Eddy 07/27/2019, 12:53 PM

## 2019-07-28 LAB — BASIC METABOLIC PANEL
Anion gap: 9 (ref 5–15)
Anion gap: 9 (ref 5–15)
Anion gap: 7 (ref 5–15)
BUN: 20 mg/dL (ref 6–20)
BUN: 17 mg/dL (ref 6–20)
BUN: 18 mg/dL (ref 6–20)
CO2: 24 mmol/L (ref 22–32)
CO2: 25 mmol/L (ref 22–32)
CO2: 26 mmol/L (ref 22–32)
Calcium: 7.3 mg/dL — ABNORMAL LOW (ref 8.9–10.3)
Calcium: 7.4 mg/dL — ABNORMAL LOW (ref 8.9–10.3)
Calcium: 7.1 mg/dL — ABNORMAL LOW (ref 8.9–10.3)
Chloride: 116 mmol/L — ABNORMAL HIGH (ref 98–111)
Chloride: 115 mmol/L — ABNORMAL HIGH (ref 98–111)
Chloride: 114 mmol/L — ABNORMAL HIGH (ref 98–111)
Creatinine, Ser: 0.85 mg/dL (ref 0.61–1.24)
Creatinine, Ser: 0.73 mg/dL (ref 0.61–1.24)
Creatinine, Ser: 0.81 mg/dL (ref 0.61–1.24)
GFR calc Af Amer: >60 mL/min (ref >60)
GFR calc Af Amer: >60 mL/min (ref >60)
GFR calc Af Amer: >60 mL/min (ref >60)
GFR calc non Af Amer: >60 mL/min (ref >60)
GFR calc non Af Amer: >60 mL/min (ref >60)
GFR calc non Af Amer: >60 mL/min (ref >60)
Glucose, Bld: 87 mg/dL (ref 70–99)
Glucose, Bld: 93 mg/dL (ref 70–99)
Glucose, Bld: 115 mg/dL — ABNORMAL HIGH (ref 70–99)
Potassium: 2.8 mmol/L — ABNORMAL LOW (ref 3.5–5.1)
Potassium: 3 mmol/L — ABNORMAL LOW (ref 3.5–5.1)
Potassium: 3.1 mmol/L — ABNORMAL LOW (ref 3.5–5.1)
Sodium: 149 mmol/L — ABNORMAL HIGH (ref 135–145)
Sodium: 149 mmol/L — ABNORMAL HIGH (ref 135–145)
Sodium: 147 mmol/L — ABNORMAL HIGH (ref 135–145)

## 2019-07-28 LAB — CBC
HCT: 24.4 % — ABNORMAL LOW (ref 39.0–52.0)
Hemoglobin: 7.7 g/dL — ABNORMAL LOW (ref 13.0–17.0)
MCH: 31.3 pg (ref 26.0–34.0)
MCHC: 31.6 g/dL (ref 30.0–36.0)
MCV: 99.2 fL (ref 80.0–100.0)
Platelets: 97 10*3/uL — ABNORMAL LOW (ref 150–400)
RBC: 2.46 MIL/uL — ABNORMAL LOW (ref 4.22–5.81)
RDW: 15.9 % — ABNORMAL HIGH (ref 11.5–15.5)
WBC: 7.3 10*3/uL (ref 4.0–10.5)
nRBC: 0.7 % — ABNORMAL HIGH (ref 0.0–0.2)

## 2019-07-28 LAB — MAGNESIUM: Magnesium: 1.9 mg/dL (ref 1.7–2.4)

## 2019-07-28 LAB — PHOSPHORUS: Phosphorus: 3.3 mg/dL (ref 2.5–4.6)

## 2019-07-28 LAB — OSMOLALITY, URINE: Osmolality, Ur: 693 mOsm/kg (ref 300–900)

## 2019-07-28 MED ORDER — DIVALPROEX SODIUM 125 MG PO CSDR
500.0000 mg | DELAYED_RELEASE_CAPSULE | Freq: Four times a day (QID) | ORAL | Status: DC
  Administered 2019-07-29 – 2019-08-02 (×18): 500 mg via ORAL
  Filled 2019-07-28 (×22): qty 4

## 2019-07-28 MED ORDER — POTASSIUM CHLORIDE CRYS ER 20 MEQ PO TBCR
40.0000 meq | EXTENDED_RELEASE_TABLET | Freq: Two times a day (BID) | ORAL | Status: AC
  Administered 2019-07-29 (×2): 40 meq via ORAL
  Filled 2019-07-28 (×2): qty 2

## 2019-07-28 MED ORDER — POTASSIUM CHLORIDE CRYS ER 20 MEQ PO TBCR
40.0000 meq | EXTENDED_RELEASE_TABLET | Freq: Two times a day (BID) | ORAL | Status: DC
  Administered 2019-07-28: 06:00:00 40 meq via ORAL
  Filled 2019-07-28: qty 2

## 2019-07-28 MED ORDER — SODIUM CHLORIDE 0.9 % IV SOLN
INTRAVENOUS | Status: DC
  Administered 2019-07-28 – 2019-07-29 (×4): via INTRAVENOUS

## 2019-07-28 MED ORDER — POTASSIUM CHLORIDE CRYS ER 20 MEQ PO TBCR
40.0000 meq | EXTENDED_RELEASE_TABLET | Freq: Two times a day (BID) | ORAL | Status: AC
  Administered 2019-07-28: 14:00:00 40 meq via ORAL
  Filled 2019-07-28: qty 2

## 2019-07-28 NOTE — Progress Notes (Signed)
Physical Therapy Treatment Patient Details Name: [name] MRN: [id number] DOB: 03-Nov-1975 Today's Date: 07/28/2019    History of Present Illness [name] is a 43 y.o. male presenting with hypernatremia, anorexia. PMH is significant for brain tumor, hypertension, intellectual disability, seizures, and visual impairment.    PT Comments    Pt in a left curved truncal posture on arrival.  He straightened to midline with minimal assist, but listed left further with any new activity.  Emphasized scooting to EOB, sitting balance at EOB, sit to stand, initially with RW with less success and then with face to face assist x2 attaining sub maximal upright stance, then a stand pivot to the chair.   Follow Up Recommendations  Home health PT;Supervision/Assistance - 24 hour     Equipment Recommendations  Other (comment)    Recommendations for Other Services       Precautions / Restrictions Precautions Precautions: Fall Restrictions Weight Bearing Restrictions: No    Mobility  Bed Mobility Overal bed mobility: Needs Assistance Bed Mobility: Supine to Sit     Supine to sit: Max assist     General bed mobility comments: cues and assist for direction, often step by step or hand over hand.   Transfers Overall transfer level: Needs assistance Equipment used: Rolling walker (2 wheeled);None Transfers: Sit to/from [employer] Sit to Stand: Max assist;+2 safety/equipment   Squat pivot transfers: Max assist;+2 physical assistance     General transfer comment: squat pivot to the chair.  Assist to move his LE's  Ambulation/Gait                 Stairs             Wheelchair Mobility    Modified Rankin (Stroke Patients Only)       Balance Overall balance assessment: Needs assistance   Sitting balance-Leahy Scale: Poor Sitting balance - Comments: Pt required support to maintain upright sitting posture, decreased truncal  coordination/strength Postural control: Left lateral lean   Standing balance-Leahy Scale: Poor                              Cognition Arousal/Alertness: Awake/alert Behavior During Therapy: Impulsive;WFL for tasks assessed/performed Overall Cognitive Status: History of cognitive impairments - at baseline                                 General Comments: Pt presents with cognitive impairments at basline; intellectual disability. Overall, very pleasant and follows simple commands.      Exercises      General Comments General comments (skin integrity, edema, etc.): vss      Pertinent Vitals/Pain Pain Assessment: Faces Faces Pain Scale: No hurt    Home Living                      Prior Function            PT Goals (current goals can now be found in the care plan section) Acute Rehab PT Goals Patient Stated Goal: get to the chair PT Goal Formulation: With patient/family Time For Goal Achievement: 08/08/19 Potential to Achieve Goals: Fair Progress towards PT goals: Progressing toward goals    Frequency    Min 3X/week      PT Plan Current plan remains appropriate    Co-evaluation  AM-PAC PT "6 Clicks" Mobility   Outcome Measure  Help needed turning from your back to your side while in a flat bed without using bedrails?: A Lot Help needed moving from lying on your back to sitting on the side of a flat bed without using bedrails?: Total Help needed moving to and from a bed to a chair (including a wheelchair)?: Total Help needed standing up from a chair using your arms (e.g., wheelchair or bedside chair)?: Total Help needed to walk in hospital room?: Total Help needed climbing 3-5 steps with a railing? : Total 6 Click Score: 7    End of Session   Activity Tolerance: Patient tolerated treatment well Patient left: in chair;with call bell/phone within reach;with chair alarm set Nurse Communication: Mobility  status PT Visit Diagnosis: Other abnormalities of gait and mobility (R26.89);Muscle weakness (generalized) (M62.81)     Time: [id number] PT Time Calculation (min) (ACUTE ONLY): 28 min  Charges:  $Therapeutic Exercise: 8-22 mins $Therapeutic Activity: 8-22 mins                     07/28/2019  Donnella Sham, PT Acute Rehabilitation Services [phone]  (pager) [phone]  (office)   Tessie Fass Glover Capano 07/28/2019, 5:08 PM

## 2019-07-28 NOTE — Progress Notes (Signed)
Family Medicine Teaching Service Daily Progress Note Intern Pager: [phone]  Patient name: [name] Medical record number: [id number] Date of birth: [birth year] Age: 43 y.o. Gender: male  Primary Care Provider: Patient, No Pcp Per Consultants: Neurosurgery Code Status: Full  Pt Overview and Major Events to Date:  Admitted 09/29  Assessment and Plan:  [name] a 43 y.o.malepresenting with hypernatremia, anorexia. PMH is significant forbrain tumor, hypertension, intellectual disability, seizures,andvisual impairment.  Hypernatremia Dehydration  Failure to Thrive -Last Sodium 148, Chloride 118 -Continue N/S at maintenance, consider switch to 1/2N/S or D51/2N/S to decrease sodium levels -BMP q12, if next Sodium WNL discontinue IV fluids  Hypokalemia -potassium 3.5 today, Last potassium 3.1, replaced with K-Dur 59meqx2 -Repeat BMP in am -Consider adding K-Dur 70meg daily   Low Hbg -Hbg today 6.8.  Baseline  Pressure ulcers-resolving -Wound Care signed off, re-consult if needed  HTN-stable -BP 96/76  -Continue to hold home medication Benicar -Monitor BP daily  Seizures-stable -No recent seizure activity since admission -Continue home medication Depakote twice daily -Seizure precautions  Rt Knee pain-stable -pt states knee pain started last night.  No reports from RN of patient complaining of pain.  On exam no erythema or edema noted.  Passive movement flexion and extension produced no pain. Pulses palpable. -Tylenol prn -Continue to monitor for increasing pain  AKI- resolved -Cr-0.81 today, baseline around 1.5   Weight loss-chronic -Weight increase 0.3kg since admission, now 54.7kg from 54.4kg -Per chart, weight 80kg 12/27/2018 -Nutritional management following -Consider outpatient follow up for evaluation of potential weight loss to include food aversion, GERD, Malignancy, Neglect  FEN/GI: Dysphagia II PPx: Lovenox daily   Disposition: Home with home health when medically stable.  Subjective:  Pt resting in bed comfortably watching TV.  States had a good night.  Complains of Rt knee pain that started last night. Reports no trauma.  Spoke with RN who states that he had not complained overnight of any knee pain. Denies any headache, dizziness, chest pain, SOB, n/v or abdo pain.  Objective: Temp:  [97.8 F (36.6 C)-98.4 F (36.9 C)] 98.4 F (36.9 C) (10/03 0441) Pulse Rate:  [73-94] 94 (10/03 0441) Resp:  [12-18] 18 (10/03 0441) BP: (96-121)/(67-76) 108/67 (10/03 0441) SpO2:  [97 %-100 %] 100 % (10/03 0441) Weight:  [54.7 kg] 54.7 kg (10/02 2102) Physical Exam: General: Alert, slow speech, in no acute distress Cardiovascular: RRR, no murmurs appreciated Respiratory: CTAB, no crackles, no rhonchi Abdomen: soft, non tender, non distended  Extremities: Rt knee non tender to touch, no erythema, no edema, pulses present, good flexion and extension on passive movemnent  Laboratory: Recent Labs  Lab 07/27/19 1040 07/28/19 0236 07/29/19 0332  WBC 8.3 7.3 5.5  HGB 8.3* 7.7* 6.8*  HCT 26.2* 24.4* 22.1*  PLT 101* 97* 101*   Recent Labs  Lab 07/24/19 1014  07/25/19 1207  07/26/19 0715  07/28/19 1104 07/28/19 1930 07/29/19 0332  NA 171*   < > 168*   < > 160*   < > 149* 147* 148*  K 4.0   < > 3.6   < > 3.2*   < > 3.0* 3.1* 3.5  CL 127*   < > >130*   < > 126*   < > 115* 114* 118*  CO2 31   < > 27   < > 27   < > 25 26 26   BUN 81*   < > 61*   < > 44*   < > 17  18 15  CREATININE 2.50*   < > 1.31*   < > 1.01   < > 0.73 0.81 0.85  CALCIUM 9.2   < > 7.6*   < > 7.8*   < > 7.4* 7.1* 7.1*  PROT 7.7  --  5.5*  --  5.0*  --   --   --   --   BILITOT 0.6  --  0.5  --  1.0  --   --   --   --   ALKPHOS 47  --  36*  --  32*  --   --   --   --   ALT 23  --  23  --  21  --   --   --   --   AST 73*  --  78*  --  62*  --   --   --   --   GLUCOSE 116*   < > 89   < > 77   < > 93 115* 125*   < > = values in this interval  not displayed.      Imaging/Diagnostic Tests: No new imaging  Carollee Leitz, MD 07/29/2019, 7:54 AM PGY-1, [place] Intern pager: [phone], text pages welcome

## 2019-07-28 NOTE — Progress Notes (Signed)
CRITICAL VALUE ALERT  Critical Value:  Serum Osmolality 323  Date & Time Notied:  07/28/2019 1948  Provider Notified: Dr. Volanda Napoleon  Orders Received/Actions taken: No new orders given at this time

## 2019-07-28 NOTE — Progress Notes (Signed)
Pt transferred to 68M room 5 with all belongings. Sister notified.

## 2019-07-28 NOTE — Progress Notes (Signed)
Family Medicine Teaching Service Daily Progress Note Intern Pager: [phone]  Patient name: [name] Medical record number: [id number] Date of birth: 1976-05-02 Age: 42 y.o. Gender: male  Primary Care Provider: Patient, No Pcp Per Consultants: Neurosurgery Code Status: Full  Pt Overview and Major Events to Date:  9/29- admitted, CT VP malfunction  Assessment and Plan: [name] is a 43 y.o. male presenting with hypernatremia, anorexia. PMH is significant for brain tumor, hypertension, intellectual disability, seizures, and visual impairment.  Hypernatremia  Dehydration  Failure to Thrive Patient AO x3, speech is slow he responds correctly and appropriately to conversation.  Sodium decreased to 149, chloride 116.  Refeeding labs phosphorus 3.3, magnesium 1.9.  Neurosurgery evaluated patient and states that ventricles enlarged due to brain atrophy and suggest to see patient is follow-up in the outpatient setting, no acute interventions needed.  Chronic brain atrophy may explain his insidious decline, he may need more work-up in the outpatient setting, can consider neurology consult.  - IVF reduced to NS at maintenance, plan to continue until next BMP at 7pm and then consider discontinuing fluids if Na levels are back in normal range - Refeeding labs daily - PT/OT, nutrition - SW consult - Q4H BMP - AM CBC, BMP  AKI- improved Cr improved today, 10/2 to 0.85. Previous labs revealed baseline around 1.5 approx. six months ago.  - Continue fluid resuscitation - Daily BMP  Hypokalemia Level of 2.8 on 10/2. K-Cl ordered. -Replace as needed  Pressure ulcers To right elbow, right dorsum of hand, right hip, sacrum.  Very superficial, beginning of stage II. No evidence of infection. Bandages applied.   - Consult wound care  HTN Home meds include Benicar.  Most recent blood pressure is normotensive at 105/64. -We will monitor, hold home meds for now  Seizures Home  medication includes Depakote.  Seizures are sometimes tonic-clonic and sometimes absence in nature.  His last seizure was approximately 9 years ago, he has not been to see neurology in 3 years.  Patient has mildly elevated AST to 78, now 62. -Home medication Depakote  Weight loss Per chart review patient with between 30 and 50 pound weight loss over the past 6 to 8 months.  Unsure if this is related simply to reduced intake or other etiology.  Patient has history of brain tumor removed at age 50 per chart review. - Recommend outpatient follow up to investigate potential causes of weight loss including colonoscopy, workup for prostate cancer due to family history, etc.  FEN/GI: Dysphagia II diet PPx: lovenox  Disposition: Patient requires inpatient hospitalization for correction of hypernatremia and further medical work-up  Subjective:  Patient denies complaints this morning  Objective: Temp:  [98.3 F (36.8 C)-99.6 F (37.6 C)] 98.4 F (36.9 C) (10/02 1200) Pulse Rate:  [73-85] 73 (10/02 1200) Resp:  [12-18] 12 (10/02 0807) BP: (103-128)/(71-77) 121/75 (10/02 1200) SpO2:  [99 %-100 %] 99 % (10/02 1200) Physical Exam:  General: Alert and oriented in no apparent distress, cachectic, depressed area from previous surgical history present in left skull Heart: Regular rate and rhythm with no murmurs appreciated Lungs: CTA bilaterally, no wheezing Abdomen: Scaphoid appearing. Bowel sounds present, no abdominal pain Skin: Warm and dry Extremities: No lower extremity edema   Laboratory: Recent Labs  Lab 07/26/19 0715 07/27/19 1040 07/28/19 0236  WBC 5.8 8.3 7.3  HGB 8.2* 8.3* 7.7*  HCT 27.3* 26.2* 24.4*  PLT 102* 101* 97*   Recent Labs  Lab 07/24/19 1014  07/25/19 1207  07/26/19 0715  07/27/19 1833 07/28/19 0236 07/28/19 1104  NA 171*   < > 168*   < > 160*   < > 153* 149* 149*  K 4.0   < > 3.6   < > 3.2*   < > 3.3* 2.8* 3.0*  CL 127*   < > >130*   < > 126*   < > 120* 116*  115*  CO2 31   < > 27   < > 27   < > 26 24 25   BUN 81*   < > 61*   < > 44*   < > 23* 20 17  CREATININE 2.50*   < > 1.31*   < > 1.01   < > 0.89 0.85 0.73  CALCIUM 9.2   < > 7.6*   < > 7.8*   < > 7.5* 7.3* 7.4*  PROT 7.7  --  5.5*  --  5.0*  --   --   --   --   BILITOT 0.6  --  0.5  --  1.0  --   --   --   --   ALKPHOS 47  --  36*  --  32*  --   --   --   --   ALT 23  --  23  --  21  --   --   --   --   AST 73*  --  78*  --  62*  --   --   --   --   GLUCOSE 116*   < > 89   < > 77   < > 86 87 93   < > = values in this interval not displayed.    Imaging/Diagnostic Tests: Ct Head Wo Contrast  Result Date: 07/24/2019 CLINICAL DATA:  Altered level of consciousness EXAM: CT HEAD WITHOUT CONTRAST TECHNIQUE: Contiguous axial images were obtained from the base of the skull through the vertex without intravenous contrast. 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 There has been interval increase in caliber of the lateral and third ventricles when compared to the prior examination dated 07/28/2017. Incidental note of cavum septum pellucidum variant of the lateral ventricles. Vascular: No hyperdense vessel or unexpected calcification. Skull: Left hemicraniectomy.  Negative for fracture or focal lesion. Sinuses/Orbits: No acute finding. Other: None. IMPRESSION: 1. 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 There has been interval increase in caliber of the lateral and third ventricles when compared to the prior examination dated 07/28/2017, concerning for shunt catheter malfunction. No obvious obstructing lesion along the aqueduct or within the fourth ventricle. Chronic global cerebral volume loss and/or acute fluid status  may exaggerate this appearance given time interval from prior examination. 2.  Status post left hemicraniectomy. Electronically Signed   By: Eddie Candle M.D.   On: 07/24/2019 16:28   Dg Swallowing Func-speech Pathology  Result Date: 07/26/2019 Objective Swallowing Evaluation: Type of Study: MBS-Modified Barium Swallow Study  Patient Details Name: KISHAWN HUTCHERSON MRN: [id number] Date of Birth: [birth year] Today's Date: 07/26/2019 Time: SLP Start Time (ACUTE ONLY): 0909 -SLP Stop Time (ACUTE ONLY): 0936 SLP Time Calculation (min) (ACUTE ONLY): 27 min Past Medical History: Past Medical History: Diagnosis Date . Brain tumor (benign) ([place])  . Hypertension  .  Mental retardation, mild (I.Q. 50-70)  . Seizures ([place])  Past Surgical History: Past Surgical History: Procedure Laterality Date . BRAIN SURGERY   HPI: RODRIQUEZ WINKLES a 43 y.o.malewithpast medical history significant for L brain tumor removal via craniectomy, hypertension, mental retardation, seizures, scoliosis, and visual impairment who presents for evaluation of failure to thrive. His sister reports that in the last 2 years patient has gradually declined, with steadily worsening mentation and steady weight loss. In 2018 patient weighed 68 kg, which reveals an overall weight loss of about 30 pounds in the interim. Patient states that he frequently is not hungry, but he cannot explain why that is.  He denies any acid reflux, pain with eating, nausea, and vomiting. 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 Currently NPO.  No data recorded Assessment / Plan / Recommendation CHL IP CLINICAL IMPRESSIONS 07/26/2019 Clinical Impression Pt exhibited oropharyngeal dysphagia characterized by delayed swallow initiation to pyriform sinuses, reduced inversion of epiglottis resulting in penetration, and silent aspiration that was unable to be cleared. Pt required total assist for feeding due to impaired fine  motor skills and preference for large sips. Chin tuck tested but not determined to be an effective or reliable strategy for him. Produced unproductive throat clear when modeled, physically unable to produce volitional cough when attempted.  Although inversion of epiglottis was incomplete, laryngeal closure was achieved but timing of laryngeal closure was late with barium having reached level of pyriform sinuses resulting in silent aspiration with thins. Mod residue in valleculae and pyriform sinuses likely due to decreased pharyngeal peristalsis. Nectar via cup had significant delay in swallow initiation with two instances of penetration that were expelled and left no residue. Given nectar via straw pt took large sips requiring SLP to intervene, followed by mod silent aspiration. Puree trials had delay as well as silent aspiration (in one of two trials) from pyriform sinuses on second swallow from residue left after first swallow. Esophageal scan revealed stasis with residue in distal esophagus. Pt reported being on thickened liquids at baseline. Due to mod-severe aspiration risk, recommend Dys 1with Nectar thick liquids, meds crushed in puree. Full staff supervision with assist for self-feeding necessary to ensure pt is taking appropriately sized sips/bites and producing throat clears intermittently. SLP will continue to follow for diet toleration with recommendation for home health SLP if d/c home.  SLP Visit Diagnosis Dysphagia, oropharyngeal phase (R13.12) Attention and concentration deficit following -- Frontal lobe and executive function deficit following -- Impact on safety and function Moderate aspiration risk;Severe aspiration risk   CHL IP TREATMENT RECOMMENDATION 07/26/2019 Treatment Recommendations Therapy as outlined in treatment plan below   Prognosis 07/26/2019 Prognosis for Safe Diet Advancement Guarded Barriers to Reach Goals Cognitive deficits Barriers/Prognosis Comment -- CHL IP DIET RECOMMENDATION  07/26/2019 SLP Diet Recommendations Dysphagia 1 (Puree) solids;Nectar thick liquid Liquid Administration via Cup;No straw Medication Administration Crushed with puree Compensations Minimize environmental distractions;Small sips/bites;Clear throat intermittently Postural Changes Seated upright at 90 degrees   CHL IP OTHER RECOMMENDATIONS 07/26/2019 Recommended Consults -- Oral Care Recommendations Oral care BID;Staff/trained caregiver to provide oral care Other Recommendations Order thickener from pharmacy   CHL IP FOLLOW UP RECOMMENDATIONS 07/26/2019 Follow up Recommendations 24 hour supervision/assistance   CHL IP FREQUENCY AND DURATION 07/26/2019 Speech Therapy Frequency (ACUTE ONLY) min 1 x/week Treatment Duration 1 week      CHL IP ORAL PHASE 07/26/2019 Oral Phase Impaired Oral - Pudding Teaspoon NT Oral - Pudding Cup --  Oral - Honey Teaspoon -- Oral - Honey Cup -- Oral - Nectar Teaspoon NT Oral - Nectar Cup Decreased bolus cohesion Oral - Nectar Straw -- Oral - Thin Teaspoon -- Oral - Thin Cup Decreased bolus cohesion Oral - Thin Straw -- Oral - Puree Lingual pumping;Premature spillage;Delayed oral transit Oral - Mech Soft Delayed oral transit;Lingual pumping Oral - Regular -- Oral - Multi-Consistency -- Oral - Pill -- Oral Phase - Comment --  CHL IP PHARYNGEAL PHASE 07/26/2019 Pharyngeal Phase Impaired Pharyngeal- Pudding Teaspoon -- Pharyngeal -- Pharyngeal- Pudding Cup -- Pharyngeal -- Pharyngeal- Honey Teaspoon -- Pharyngeal -- Pharyngeal- Honey Cup -- Pharyngeal -- Pharyngeal- Nectar Teaspoon -- Pharyngeal -- Pharyngeal- Nectar Cup Reduced epiglottic inversion;Delayed swallow initiation-pyriform sinuses;Penetration/Aspiration during swallow;Pharyngeal residue - valleculae;Pharyngeal residue - pyriform Pharyngeal Material enters airway, remains ABOVE vocal cords and not ejected out;Material enters airway, remains ABOVE vocal cords then ejected out Pharyngeal- Nectar Straw Reduced epiglottic  inversion;Penetration/Aspiration during swallow;Moderate aspiration Pharyngeal Material enters airway, passes BELOW cords without attempt by patient to eject out (silent aspiration) Pharyngeal- Thin Teaspoon -- Pharyngeal -- Pharyngeal- Thin Cup Pharyngeal residue - valleculae;Pharyngeal residue - pyriform;Reduced epiglottic inversion;Delayed swallow initiation-pyriform sinuses;Penetration/Aspiration during swallow;Trace aspiration;Compensatory strategies attempted (with notebox) Pharyngeal Material enters airway, passes BELOW cords without attempt by patient to eject out (silent aspiration) Pharyngeal- Thin Straw -- Pharyngeal -- Pharyngeal- Puree Moderate aspiration;Penetration/Aspiration during swallow;Reduced airway/laryngeal closure;Reduced epiglottic inversion;Delayed swallow initiation-vallecula Pharyngeal Material enters airway, passes BELOW cords without attempt by patient to eject out (silent aspiration) Pharyngeal- Mechanical Soft Reduced pharyngeal peristalsis;Pharyngeal residue - pyriform;Pharyngeal residue - valleculae;Pharyngeal residue - posterior pharnyx;Delayed swallow initiation-vallecula Pharyngeal -- Pharyngeal- Regular -- Pharyngeal -- Pharyngeal- Multi-consistency -- Pharyngeal -- Pharyngeal- Pill -- Pharyngeal -- Pharyngeal Comment --  CHL IP CERVICAL ESOPHAGEAL PHASE 07/26/2019 Cervical Esophageal Phase WFL Pudding Teaspoon -- Pudding Cup -- Honey Teaspoon -- Honey Cup -- Nectar Teaspoon -- Nectar Cup -- Nectar Straw -- Thin Teaspoon -- Thin Cup -- Thin Straw -- Puree -- Mechanical Soft -- Regular -- Multi-consistency -- Pill -- Cervical Esophageal Comment -- [place] Siren 07/26/2019, 12:19 PM Orbie Pyo Litaker M.Ed Actor Pager [phone] Office [phone]              Lurline Del, DO 07/28/2019, 1:46 PM PGY-1, [place] Intern pager: [phone], text pages welcome

## 2019-07-28 NOTE — Progress Notes (Signed)
  Speech Language Pathology Treatment: Dysphagia  Patient Details Name: [name] MRN: [id number] DOB: 1976/07/08 Today's Date: 07/28/2019 Time: [id number] SLP Time Calculation (min) (ACUTE ONLY): 20 min  Assessment / Plan / Recommendation Clinical Impression  Reason manipulated nectar thick and Dys 1 texture (Dys 2 texture not available/consumed 100% lunch tray) void of aspiration signs. Thickness of liquids allows him increased control to prevent spill and propel volitionally. Eructation present x 2 coincides with esophageal stasis seen in distal esophagus during MBS. SLP educated pt to remain upright after meals with rationale for 30 minutes minimum. Pt on thick liquids prior to admission and recommended from [facility]. ST will sign off with rec's for home health ST follow up for texture advancement and thick liquids with repeat MBS when appropriate.    HPI HPI: [name] a 42 y.o.malewithpast medical history significant for L brain tumor removal via craniectomy, hypertension, mental retardation, seizures, scoliosis, and visual impairment who presents for evaluation of failure to thrive. His sister reports that in the last 2 years patient has gradually declined, with steadily worsening mentation and steady weight loss. In 2018 patient weighed 68 kg, which reveals an overall weight loss of about 30 pounds in the interim. Patient states that he frequently is not hungry, but he cannot explain why that is.  He denies any acid reflux, pain with eating, nausea, and vomiting. 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 Currently NPO.       SLP Plan  Continue with current plan of care       Recommendations  Diet recommendations: Dysphagia 2 (fine chop);Nectar-thick liquid Liquids provided via: Cup Medication Administration: Crushed with puree Supervision: Intermittent supervision to cue for compensatory strategies;Staff to  assist with self feeding Compensations: Slow rate;Small sips/bites;Lingual sweep for clearance of pocketing;Clear throat intermittently Postural Changes and/or Swallow Maneuvers: Seated upright 90 degrees;Upright 30-60 min after meal                Oral Care Recommendations: Oral care BID Follow up Recommendations: 24 hour supervision/assistance SLP Visit Diagnosis: Dysphagia, oropharyngeal phase (R13.12) Plan: Continue with current plan of care       GO                [name] 07/28/2019, 2:15 PM [name] M.Ed Risk analyst [phone] Office [phone]

## 2019-07-29 DIAGNOSIS — N179 Acute kidney failure, unspecified: Secondary | ICD-10-CM

## 2019-07-29 LAB — BASIC METABOLIC PANEL
Anion gap: 4 — ABNORMAL LOW (ref 5–15)
Anion gap: 8 (ref 5–15)
BUN: 15 mg/dL (ref 6–20)
BUN: 15 mg/dL (ref 6–20)
CO2: 26 mmol/L (ref 22–32)
CO2: 22 mmol/L (ref 22–32)
Calcium: 7.1 mg/dL — ABNORMAL LOW (ref 8.9–10.3)
Calcium: 7.4 mg/dL — ABNORMAL LOW (ref 8.9–10.3)
Chloride: 118 mmol/L — ABNORMAL HIGH (ref 98–111)
Chloride: 119 mmol/L — ABNORMAL HIGH (ref 98–111)
Creatinine, Ser: 0.85 mg/dL (ref 0.61–1.24)
Creatinine, Ser: 0.83 mg/dL (ref 0.61–1.24)
GFR calc Af Amer: >60 mL/min (ref >60)
GFR calc Af Amer: >60 mL/min (ref >60)
GFR calc non Af Amer: >60 mL/min (ref >60)
GFR calc non Af Amer: >60 mL/min (ref >60)
Glucose, Bld: 125 mg/dL — ABNORMAL HIGH (ref 70–99)
Glucose, Bld: 108 mg/dL — ABNORMAL HIGH (ref 70–99)
Potassium: 3.5 mmol/L (ref 3.5–5.1)
Potassium: 3.4 mmol/L — ABNORMAL LOW (ref 3.5–5.1)
Sodium: 148 mmol/L — ABNORMAL HIGH (ref 135–145)
Sodium: 149 mmol/L — ABNORMAL HIGH (ref 135–145)

## 2019-07-29 LAB — CBC
HCT: 22.1 % — ABNORMAL LOW (ref 39.0–52.0)
Hemoglobin: 6.8 g/dL — CL (ref 13.0–17.0)
MCH: 30.9 pg (ref 26.0–34.0)
MCHC: 30.8 g/dL (ref 30.0–36.0)
MCV: 100.5 fL — ABNORMAL HIGH (ref 80.0–100.0)
Platelets: 101 10*3/uL — ABNORMAL LOW (ref 150–400)
RBC: 2.2 MIL/uL — ABNORMAL LOW (ref 4.22–5.81)
RDW: 15.9 % — ABNORMAL HIGH (ref 11.5–15.5)
WBC: 5.5 10*3/uL (ref 4.0–10.5)
nRBC: 0.9 % — ABNORMAL HIGH (ref 0.0–0.2)

## 2019-07-29 LAB — CULTURE, BLOOD (ROUTINE X 2)
Culture: NO GROWTH
Culture: NO GROWTH
Special Requests: ADEQUATE
Special Requests: ADEQUATE

## 2019-07-29 LAB — HEMOGLOBIN AND HEMATOCRIT, BLOOD
HCT: 22.1 % — ABNORMAL LOW (ref 39.0–52.0)
Hemoglobin: 7.3 g/dL — ABNORMAL LOW (ref 13.0–17.0)

## 2019-07-29 MED ORDER — POTASSIUM CHLORIDE CRYS ER 20 MEQ PO TBCR
40.0000 meq | EXTENDED_RELEASE_TABLET | Freq: Once | ORAL | Status: AC
  Administered 2019-07-29: 40 meq via ORAL
  Filled 2019-07-29: qty 2

## 2019-07-29 MED ORDER — ACETAMINOPHEN 500 MG PO TABS
500.0000 mg | ORAL_TABLET | Freq: Four times a day (QID) | ORAL | Status: DC | PRN
  Administered 2019-07-31: 500 mg via ORAL
  Filled 2019-07-29: qty 1

## 2019-07-29 NOTE — Plan of Care (Signed)
  Problem: Nutrition: Goal: Adequate nutrition will be maintained Outcome: Progressing   

## 2019-07-30 ENCOUNTER — Inpatient Hospital Stay (HOSPITAL_COMMUNITY): Payer: Medicare Other

## 2019-07-30 ENCOUNTER — Encounter (HOSPITAL_COMMUNITY): Payer: Self-pay | Admitting: Radiology

## 2019-07-30 DIAGNOSIS — R9389 Abnormal findings on diagnostic imaging of other specified body structures: Secondary | ICD-10-CM

## 2019-07-30 DIAGNOSIS — R5081 Fever presenting with conditions classified elsewhere: Secondary | ICD-10-CM

## 2019-07-30 DIAGNOSIS — L89001 Pressure ulcer of unspecified elbow, stage 1: Secondary | ICD-10-CM

## 2019-07-30 LAB — BASIC METABOLIC PANEL
Anion gap: 3 — ABNORMAL LOW (ref 5–15)
Anion gap: 5 (ref 5–15)
BUN: 11 mg/dL (ref 6–20)
BUN: 11 mg/dL (ref 6–20)
CO2: 26 mmol/L (ref 22–32)
CO2: 23 mmol/L (ref 22–32)
Calcium: 7.4 mg/dL — ABNORMAL LOW (ref 8.9–10.3)
Calcium: 7.5 mg/dL — ABNORMAL LOW (ref 8.9–10.3)
Chloride: 121 mmol/L — ABNORMAL HIGH (ref 98–111)
Chloride: 120 mmol/L — ABNORMAL HIGH (ref 98–111)
Creatinine, Ser: 0.73 mg/dL (ref 0.61–1.24)
Creatinine, Ser: 0.74 mg/dL (ref 0.61–1.24)
GFR calc Af Amer: >60 mL/min (ref >60)
GFR calc Af Amer: >60 mL/min (ref >60)
GFR calc non Af Amer: >60 mL/min (ref >60)
GFR calc non Af Amer: >60 mL/min (ref >60)
Glucose, Bld: 99 mg/dL (ref 70–99)
Glucose, Bld: 105 mg/dL — ABNORMAL HIGH (ref 70–99)
Potassium: 3.5 mmol/L (ref 3.5–5.1)
Potassium: 3.5 mmol/L (ref 3.5–5.1)
Sodium: 150 mmol/L — ABNORMAL HIGH (ref 135–145)
Sodium: 148 mmol/L — ABNORMAL HIGH (ref 135–145)

## 2019-07-30 LAB — CBC
HCT: 21.1 % — ABNORMAL LOW (ref 39.0–52.0)
HCT: 21.8 % — ABNORMAL LOW (ref 39.0–52.0)
Hemoglobin: 6.8 g/dL — CL (ref 13.0–17.0)
Hemoglobin: 7 g/dL — ABNORMAL LOW (ref 13.0–17.0)
MCH: 31.6 pg (ref 26.0–34.0)
MCH: 31.5 pg (ref 26.0–34.0)
MCHC: 32.2 g/dL (ref 30.0–36.0)
MCHC: 32.1 g/dL (ref 30.0–36.0)
MCV: 98.1 fL (ref 80.0–100.0)
MCV: 98.2 fL (ref 80.0–100.0)
Platelets: 104 10*3/uL — ABNORMAL LOW (ref 150–400)
Platelets: 107 10*3/uL — ABNORMAL LOW (ref 150–400)
RBC: 2.15 MIL/uL — ABNORMAL LOW (ref 4.22–5.81)
RBC: 2.22 MIL/uL — ABNORMAL LOW (ref 4.22–5.81)
RDW: 16.5 % — ABNORMAL HIGH (ref 11.5–15.5)
RDW: 16.4 % — ABNORMAL HIGH (ref 11.5–15.5)
WBC: 6.1 10*3/uL (ref 4.0–10.5)
WBC: 5.7 10*3/uL (ref 4.0–10.5)
nRBC: 1.1 % — ABNORMAL HIGH (ref 0.0–0.2)
nRBC: 1.2 % — ABNORMAL HIGH (ref 0.0–0.2)

## 2019-07-30 LAB — PSA: Prostatic Specific Antigen: 0.78 ng/mL (ref 0.00–4.00)

## 2019-07-30 LAB — OSMOLALITY: Osmolality: 308 mOsm/kg — ABNORMAL HIGH (ref 275–295)

## 2019-07-30 LAB — OSMOLALITY, URINE: Osmolality, Ur: 691 mOsm/kg (ref 300–900)

## 2019-07-30 MED ORDER — IOHEXOL 300 MG/ML  SOLN
100.0000 mL | Freq: Once | INTRAMUSCULAR | Status: AC | PRN
  Administered 2019-07-30: 100 mL via INTRAVENOUS

## 2019-07-30 NOTE — Progress Notes (Addendum)
Family Medicine Teaching Service Daily Progress Note Intern Pager: [phone]  Patient name: [name] Medical record number: [id number] Date of birth: Mar 14, 1976 Age: 43 y.o. Gender: male  Primary Care Provider: Patient, No Pcp Per Consultants: Neurosurgery Code Status: Full  Pt Overview and Major Events to Date:  Admitted 09/29  Assessment and Plan:  [name] a 43 y.o.malepresenting with hypernatremia, anorexia. PMH is significant forbrain tumor, hypertension, intellectual disability, seizures,andvisual impairment.  Hypernatremia Dehydration  Failure to Thrive Hypernatremia appears to be slowly increasing on normal saline.  -Last sodium 149, chloride 119, sodium increased from 148 on 10/3, 147 on 10/2 -DC fluids and continue to monitor sodium levels every 12hrs. -Plan to pursue further work-up for hyponatremia, redraw serum/urine osmolarity.  Hypokalemia -Potassium 3.5 today 10/4 -Repeat BMP daily  New onset fever: Fever began 10/4 at 0500.  T-max 100.8 Fahrenheit.  Fever resolved without acetaminophen.  Currently afebrile.  Fever etiology unknown, chest x-ray shows left lower lobe atelectasis versus pneumonia versus aspiration. -Monitor fever curve -Consider antibiotics for possible pneumonia should patient fever again.  Low Hbg -hemoglobin 6.8 today, baseline around 12 per chart review. -Repeat CBC -Pending results of CT chest abdomen pelvis and results of repeat CBC consider transfusion as patient is below 7.0 threshold.  Pressure ulcers-resolving -Wound Care signed off, re-consult if needed  HTN-stable -BP 110/57 -Continue to hold home medication Benicar -Monitor BP daily   Seizures-stable -No recent seizure activity since admission -Continue home medication Depakote twice daily -Seizure precautions  Rt Knee pain-stable Patient without complaints of knee pain this morning. -Tylenol prn -Continue to monitor for increasing pain  AKI-  resolved -Cr-0.81 today, baseline around 1.5   Weight loss-chronic -Weight increase 0.3kg since admission, now 54.7kg from 54.4kg -Per chart, weight 80kg 12/27/2018 -Nutritional management following -Consider outpatient follow up for evaluation of potential weight loss to include food aversion, GERD, Malignancy, Neglect -Plan to go ahead with CT chest, abdomen, pelvis to look for etiology of patient's weight loss and worsening anemia. -We will also screen PSA as patient's father died of prostate cancer at a relatively young age.  FEN/GI: Dysphagia II PPx: Lovenox daily   Disposition: Home with home health when medically stable.  Subjective:  Patient states he feels well he denies any pains or other complaints this morning.  He specifically denies any abdominal pain, pain with urination, shortness of breath or difficulty breathing, or chest pain.  Objective: Temp:  [98.2 F (36.8 C)-100.8 F (38.2 C)] 100.7 F (38.2 C) (10/04 0600) Pulse Rate:  [68-117] 98 (10/04 0600) Resp:  [14-18] 14 (10/04 0600) BP: (98-117)/(57-95) 110/57 (10/04 0449) SpO2:  [88 %-100 %] 96 % (10/04 0600) Physical Exam: General: Alert and oriented in no apparent distress, cachectic, resting comfortably in bed Heart: Regular rate and rhythm with no murmurs appreciated Lungs: CTA bilaterally, no wheezing Abdomen: Bowel sounds present, no abdominal pain Skin: Warm and dry  Extremities: No lower extremity edema, right elbow stage I-II pressure ulcer with minor erythema.  Laboratory: Recent Labs  Lab 07/27/19 1040 07/28/19 0236 07/29/19 0332 07/29/19 1616  WBC 8.3 7.3 5.5  --   HGB 8.3* 7.7* 6.8* 7.3*  HCT 26.2* 24.4* 22.1* 22.1*  PLT 101* 97* 101*  --    Recent Labs  Lab 07/24/19 1014  07/25/19 1207  07/26/19 0715  07/28/19 1930 07/29/19 0332 07/29/19 1616  NA 171*   < > 168*   < > 160*   < > 147* 148* 149*  K  4.0   < > 3.6   < > 3.2*   < > 3.1* 3.5 3.4*  CL 127*   < > >130*   < > 126*   < >  114* 118* 119*  CO2 31   < > 27   < > 27   < > 26 26 22   BUN 81*   < > 61*   < > 44*   < > 18 15 15   CREATININE 2.50*   < > 1.31*   < > 1.01   < > 0.81 0.85 0.83  CALCIUM 9.2   < > 7.6*   < > 7.8*   < > 7.1* 7.1* 7.4*  PROT 7.7  --  5.5*  --  5.0*  --   --   --   --   BILITOT 0.6  --  0.5  --  1.0  --   --   --   --   ALKPHOS 47  --  36*  --  32*  --   --   --   --   ALT 23  --  23  --  21  --   --   --   --   AST 73*  --  78*  --  62*  --   --   --   --   GLUCOSE 116*   < > 89   < > 77   < > 115* 125* 108*   < > = values in this interval not displayed.     Imaging/Diagnostic Tests: No new imaging  Lurline Del, DO 07/30/2019, 6:11 AM PGY-1, [place] Intern pager: [phone], text pages welcome

## 2019-07-30 NOTE — TOC Initial Note (Signed)
Transition of Care [facility]) - Initial/Assessment Note    Patient Details  Name: [name] MRN: [id number] Date of Birth: [birth year]  [facility] CM/SW Contact:    Carles Collet, RN Phone Number: 07/30/2019, 9:38 AM  Clinical Narrative:                Spoke with patient's mom and sister. Discussed home situation and potential home needs. Patient has aid through [employer] with his mediacid coverage. They are interested in additional help through a RN PT OT. Discussed [facility] providers and medicare.gov ratings. They would like to use [facility]. Referral accepted by [facility]. Patient has hospital bed at home, and family would like new WC as they state the one they have one was borrowed and it falling apart. Order placed. Will need requested for delivery to room closer to DC. Family states they will be able to provide transport home.    Expected Discharge Plan: [place] Barriers to Discharge: Continued Medical Work up   Patient Goals and CMS Choice Patient states their goals for this hospitalization and ongoing recovery are:: to go home CMS Medicare.gov Compare Post Acute Care list provided to:: Patient Choice offered to / list presented to : Patient  Expected Discharge Plan and Services Expected Discharge Plan: [place]   Discharge Planning Services: CM Consult Post Acute Care Choice: Home Health, Durable Medical Equipment Living arrangements for the past 2 months: Single Family Home                           HH Arranged: RN, PT, OT [facility] Agency: [place] Date [facility] Agency Contacted: 07/30/19 Time HH Agency Contacted: [phone] Representative spoke with at [place]: [facility]  Prior Living Arrangements/Services Living arrangements for the past 2 months: [place] with:: Self, Parents, Siblings   Do you feel safe going back to the place where you live?: Yes          Current home services: DME, Homehealth  aide([employer])    Activities of Daily Living      Permission Sought/Granted                  Emotional Assessment              Admission diagnosis:  Hypernatremia [E87.0] Failure to thrive in adult [R62.7] AKI (acute kidney injury) ([place]) [N17.9] Acute hypernatremia [E87.0] Patient Active Problem List   Diagnosis Date Noted  . Pressure injury of skin 07/25/2019  . Hypernatremia 07/24/2019  . Acute hypernatremia 07/24/2019  . Right leg weakness 04/03/2016  . Abnormality of gait 03/12/2016  . MENTAL RETARDATION, MILD 09/05/2007  . 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 HYPERTENSION 09/05/2007  . 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 HEMORRHAGE, INTRACEREBRAL 06/28/2007  . 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 H/O craniotomy 06/28/2007  . INFECTION DUE TO NERVOUS SYSTEM DEVICE/GRAFT 06/22/2007   PCP:  Patient, No Pcp Per Pharmacy:   [id number] [address], [place] [address] Phone: [phone] Fax: [phone]  [id number] [place], [place] - [address] [place] [address] Phone: [phone] Fax: [phone]     Social Determinants of Health (SDOH) Interventions    Readmission Risk Interventions No flowsheet data found.

## 2019-07-30 NOTE — Plan of Care (Signed)
  Problem: Clinical Measurements: °Goal: Will remain free from infection °Outcome: Progressing °Goal: Diagnostic test results will improve °Outcome: Progressing °Goal: Respiratory complications will improve °Outcome: Progressing °Goal: Cardiovascular complication will be avoided °Outcome: Progressing °  °Problem: Nutrition: °Goal: Adequate nutrition will be maintained °Outcome: Progressing °  °Problem: Elimination: °Goal: Will not experience complications related to bowel motility °Outcome: Progressing °Goal: Will not experience complications related to urinary retention °Outcome: Progressing °  °Problem: Safety: °Goal: Ability to remain free from injury will improve °Outcome: Progressing °  °

## 2019-07-30 NOTE — Progress Notes (Signed)
CRITICAL VALUE STICKER  CRITICAL VALUE: Hgb=6.8  RECEIVER (on-site recipient of call): Emilio Math, RN   [place] NOTIFIED: 07/30/19 [phone]  MESSENGER (representative from lab): Lenna Gilford  MD NOTIFIED: Nori Riis, MD- [place] pager   TIME OF NOTIFICATION:1045   RESPONSE: awaiting orders

## 2019-07-30 NOTE — Care Management (Signed)
    Durable Medical Equipment  (From admission, onward)         Start     Ordered   07/30/19 0936  For home use only DME lightweight manual wheelchair with seat cushion  Once    Comments: Patient suffers from weakness which impairs their ability to perform daily activities like walking in the home.  A walker will not resolve  issue with performing activities of daily living. A wheelchair will allow patient to safely perform daily activities. Patient is not able to propel themselves in the home using a standard weight wheelchair due to weakness. Patient can self propel in the lightweight wheelchair. Length of need lifetime. Accessories: elevating leg rests (ELRs), wheel locks, extensions and anti-tippers.   07/30/19 [id number]

## 2019-07-30 NOTE — Progress Notes (Signed)
FPTS Interim Progress Note:  S: Paged by RN patient newly febrile to 100.71F.  Evaluate patient at bedside.  States he is doing well, endorses a productive cough for the past day, however nurse tech taking care of him overnight in the room states she has not heard him cough at all.  Denies any difficulty breathing, dysuria, urinary frequency, weakness, abdominal pain, N/V.  Has known decubitus wounds, denies any pain in these regions.  Does have several bowel movements daily, states this is normal for him.  O: Temp 100.71F, heart rate 102 bpm, RR 18, 100% on RA, BP 110/57  General: No acute distress, laying comfortably Cardiac: Regular rate and rhythm Pulmonary: Clear to auscultation bilaterally A/P without any appreciated crackles, wheezing.  Unlabored breathing, satting appropriately on room air. Abdomen: Soft, nontender Extremities: Small stage I-2 ulcer present on right elbow with pink granulation tissue, arm warmth to touch however both extremities equally warm.  Minor poorly defined surrounding erythema, mainly on medial portion of elbow/forearm.  Left hip ulceration without any surrounding erythema or drainage.  A/P: New onset fever: Mild, stable. Etiology unclear.  As patient endorses recent productive cough, will obtain a CXR to evaluate for pneumonia, however reassuringly satting appropriately on RA without any increased WOB.  Consider secondary to his multiple skin ulcerations, but low concern for infection given visualization today. However we will continue monitoring these regions closely, especially his right elbow as there is some mild medial surrounding erythema which may be secondary to having his arm adhered to his side.  Reassured by blood culture on 9/28 that was no growth at 5 days and urine culture on 9/28 which was clinically nonsignificant.  Without any urinary symptoms, low concern for precipitating UTI.  Will continue to monitor. - Obtain CXR - Continue monitoring skin  lesions - Monitor fever curve - Continue work-up as appropriate pending curve and clinical status, see progress note for today - Follow-up CBC this a.m.  Patriciaann Clan, DO

## 2019-07-31 DIAGNOSIS — R509 Fever, unspecified: Secondary | ICD-10-CM

## 2019-07-31 LAB — CBC
HCT: 20.1 % — ABNORMAL LOW (ref 39.0–52.0)
HCT: 24 % — ABNORMAL LOW (ref 39.0–52.0)
Hemoglobin: 6.5 g/dL — CL (ref 13.0–17.0)
Hemoglobin: 7.7 g/dL — ABNORMAL LOW (ref 13.0–17.0)
MCH: 32 pg (ref 26.0–34.0)
MCH: 29.8 pg (ref 26.0–34.0)
MCHC: 32.3 g/dL (ref 30.0–36.0)
MCHC: 32.1 g/dL (ref 30.0–36.0)
MCV: 99 fL (ref 80.0–100.0)
MCV: 93 fL (ref 80.0–100.0)
Platelets: 129 10*3/uL — ABNORMAL LOW (ref 150–400)
Platelets: 130 10*3/uL — ABNORMAL LOW (ref 150–400)
RBC: 2.03 MIL/uL — ABNORMAL LOW (ref 4.22–5.81)
RBC: 2.58 MIL/uL — ABNORMAL LOW (ref 4.22–5.81)
RDW: 16.7 % — ABNORMAL HIGH (ref 11.5–15.5)
RDW: 18.4 % — ABNORMAL HIGH (ref 11.5–15.5)
WBC: 5.6 10*3/uL (ref 4.0–10.5)
WBC: 6 10*3/uL (ref 4.0–10.5)
nRBC: 1.6 % — ABNORMAL HIGH (ref 0.0–0.2)
nRBC: 2.3 % — ABNORMAL HIGH (ref 0.0–0.2)

## 2019-07-31 LAB — HEMOGLOBIN AND HEMATOCRIT, BLOOD
HCT: 21.6 % — ABNORMAL LOW (ref 39.0–52.0)
Hemoglobin: 6.8 g/dL — CL (ref 13.0–17.0)

## 2019-07-31 LAB — BASIC METABOLIC PANEL
Anion gap: 7 (ref 5–15)
BUN: 10 mg/dL (ref 6–20)
CO2: 24 mmol/L (ref 22–32)
Calcium: 7.7 mg/dL — ABNORMAL LOW (ref 8.9–10.3)
Chloride: 115 mmol/L — ABNORMAL HIGH (ref 98–111)
Creatinine, Ser: 0.76 mg/dL (ref 0.61–1.24)
GFR calc Af Amer: >60 mL/min (ref >60)
GFR calc non Af Amer: >60 mL/min (ref >60)
Glucose, Bld: 114 mg/dL — ABNORMAL HIGH (ref 70–99)
Potassium: 3.3 mmol/L — ABNORMAL LOW (ref 3.5–5.1)
Sodium: 146 mmol/L — ABNORMAL HIGH (ref 135–145)

## 2019-07-31 LAB — TROPONIN I (HIGH SENSITIVITY): Troponin I (High Sensitivity): 9 ng/L (ref <18)

## 2019-07-31 LAB — ABO/RH: ABO/RH(D): O POS

## 2019-07-31 LAB — PREPARE RBC (CROSSMATCH)

## 2019-07-31 MED ORDER — SODIUM CHLORIDE 0.9% IV SOLUTION
Freq: Once | INTRAVENOUS | Status: AC
  Administered 2019-07-31: 14:00:00 via INTRAVENOUS

## 2019-07-31 MED ORDER — POTASSIUM CHLORIDE CRYS ER 20 MEQ PO TBCR
40.0000 meq | EXTENDED_RELEASE_TABLET | Freq: Once | ORAL | Status: AC
  Administered 2019-07-31: 40 meq via ORAL
  Filled 2019-07-31: qty 2

## 2019-07-31 MED ORDER — SODIUM CHLORIDE 0.9 % IV SOLN
1.5000 g | Freq: Four times a day (QID) | INTRAVENOUS | Status: DC
  Administered 2019-07-31 – 2019-08-01 (×5): 1.5 g via INTRAVENOUS
  Filled 2019-07-31 (×7): qty 4

## 2019-07-31 NOTE — Progress Notes (Signed)
Results for KAVONTE, DONEY (MRN [id number]) as of 07/31/2019 05:37  Ref. Range 07/31/2019 04:49  Hemoglobin Latest Ref Range: 13.0 - 17.0 g/dL 6.5 (LL)  Will update MD on call.

## 2019-07-31 NOTE — Progress Notes (Signed)
Stat H&H ordered. IV RN in to draw labs.

## 2019-07-31 NOTE — Progress Notes (Addendum)
MD made aware of T 100.8 and HR sustaining at 120's-130's.  Md came in and saw patient. New orders received for EKG and recheck Temp in an hour.

## 2019-07-31 NOTE — Progress Notes (Signed)
Family Medicine Teaching Service Daily Progress Note Intern Pager: [phone]  Patient name: [name] Medical record number: [id number] Date of birth: [birth year] Age: 43 y.o. Gender: male  Primary Care Provider: Patient, No Pcp Per Consultants: Neurosurgery Code Status: Full  Pt Overview and Major Events to Date:  Admitted 09/29  Assessment and Plan:  [name] a 43 y.o.malepresenting with hypernatremia, anorexia. PMH is significant forbrain tumor, hypertension, intellectual disability, seizures,andvisual impairment.  Hypernatremia Dehydration  Failure to Thrive Serum osmolality 308, urine osmolality 691. Most recent sodium 146, chloride 115 after discontinuing normal saline. -Continue to monitor BMP every 24 hours.  Hypokalemia Potassium 3.3 today, 10/5. -Potassium chloride 40 mEq oral given. -Repeat BMP daily  New onset fever: Fever first occurred 10/4 for approximately an hour and a half and resolved without acetaminophen.  Patient with repeat fever overnight to 101 Fahrenheit.  Patient was given acetaminophen and started on Unasyn.  Fever etiology unknown, chest x-ray shows left lower lobe atelectasis versus pneumonia versus aspiration.  Chest CT shows concern for potential right lower lobe developing pneumonia. -Monitor fever curve  Low Hbg Patient's hemoglobin this morning, 10/5 of 6.5. -1 unit packed RBCs ordered -recheck h&h post transfusion  Pressure ulcers-resolving -Wound care reconsulted, appreciate recommendations.  HTN-stable -BP 103/80 -Continue to hold home medication Benicar -Monitor BP daily   Seizures-stable -No recent seizure activity since admission -Continue home medication Depakote twice daily -Seizure precautions  Rt Knee pain-stable Patient without complaints of knee pain this morning. -Tylenol prn -Continue to monitor for increasing pain  AKI- resolved -Creatinine 0.76 today 10/5.  Weight loss-chronic PSA  within normal limits-0.78. CT abdomen and pelvis without IV signs to account for patient's history of anemia or weight loss. Did show concern for developing infection or sequela of aspiration in the right lower lobe of the lung. -Per chart, weight 80kg 12/27/2018 -Nutritional management following -Consider outpatient follow up for evaluation of potential weight loss to include food aversion, GERD, Malignancy, Neglect  FEN/GI: Dysphagia II PPx: Lovenox daily   Disposition: Home with home health when medically stable.  Subjective:  Patient eating breakfast during encounter this morning without complaints.  Explained to patient that his hemoglobin had dropped and that we would be replacing this with a transfusion.  Patient appeared to understand and did not have any questions.  Patient denied any pains or other complaints.  Objective: Temp:  [98.3 F (36.8 C)-101 F (38.3 C)] 98.3 F (36.8 C) (10/05 0845) Pulse Rate:  [99-128] 101 (10/05 0845) Resp:  [18-28] 20 (10/05 0845) BP: (99-123)/(64-83) 99/83 (10/05 0845) SpO2:  [90 %-100 %] 90 % (10/05 0845) Physical Exam: General: Alert and oriented in no apparent distress, eating breakfast in bed. Heart: Regular rate and rhythm with no murmurs appreciated Lungs: Mostly clear to auscultation but with some fine crackles noted in both the right and left lower fields. Abdomen: Bowel sounds present, no abdominal pain Extremities: Right elbow pressure ulcer slight warmth to palpation but without obvious visual signs of infection.   Laboratory: Recent Labs  Lab 07/30/19 0857 07/30/19 1621 07/31/19 0449 07/31/19 0551  WBC 6.1 5.7 5.6  --   HGB 6.8* 7.0* 6.5* 6.8*  HCT 21.1* 21.8* 20.1* 21.6*  PLT 104* 107* 129*  --    Recent Labs  Lab 07/25/19 1207  07/26/19 0715  07/30/19 0857 07/30/19 1621 07/31/19 0449  NA 168*   < > 160*   < > 150* 148* 146*  K 3.6   < >  3.2*   < > 3.5 3.5 3.3*  CL >130*   < > 126*   < > 121* 120* 115*  CO2 27    < > 27   < > 26 23 24   BUN 61*   < > 44*   < > 11 11 10   CREATININE 1.31*   < > 1.01   < > 0.73 0.74 0.76  CALCIUM 7.6*   < > 7.8*   < > 7.4* 7.5* 7.7*  PROT 5.5*  --  5.0*  --   --   --   --   BILITOT 0.5  --  1.0  --   --   --   --   ALKPHOS 36*  --  32*  --   --   --   --   ALT 23  --  21  --   --   --   --   AST 78*  --  62*  --   --   --   --   GLUCOSE 89   < > 77   < > 99 105* 114*   < > = values in this interval not displayed.     Imaging/Diagnostic Tests: No new imaging  Scott Del, DO 07/31/2019, 1:31 PM PGY-1, [place] Intern pager: [phone], text pages welcome

## 2019-07-31 NOTE — Progress Notes (Signed)
Physical Therapy Treatment Patient Details Name: [name] MRN: [id number] DOB: Nov 09, 1975 Today's Date: 07/31/2019    History of Present Illness [name] is a 43 y.o. male presenting with hypernatremia, anorexia. PMH is significant for brain tumor, hypertension, intellectual disability, seizures, and visual impairment.    PT Comments    Continuing work on functional mobility and activity tolerance;  Assisted pt with hygeine after BM; Participated well in rolling and general bed mobility, and performed some gentle therex; Noting decr Hgb today, so opted to place bed in chair position at end of session (as opposed to transferring OOB to chair) Maximove pad in room, and Nursing can assist him OOB with the [facility] later today   Follow Up Recommendations  Home health PT;Supervision/Assistance - 24 hour     Equipment Recommendations  (Family seems pretty well-equipped; Consider hospital bed and air overlay)    Recommendations for Other Services       Precautions / Restrictions Precautions Precautions: Fall Restrictions Weight Bearing Restrictions: No    Mobility  Bed Mobility Overal bed mobility: Needs Assistance Bed Mobility: Rolling Rolling: Mod assist         General bed mobility comments: cues and assist for direction, often step by step or hand over hand. Rolled R and L a few times while we assisted with hygeine post pt ahving a BM; Ultimately, slid pt up and put the bed in a chair position  Transfers                    Ambulation/Gait                 Stairs             Wheelchair Mobility    Modified Rankin (Stroke Patients Only)       Balance                                            Cognition Arousal/Alertness: Awake/alert Behavior During Therapy: WFL for tasks assessed/performed Overall Cognitive Status: History of cognitive impairments - at baseline                                         Exercises Other Exercises Other Exercises: Heels likes R and L x10 Other Exercises: bil shoulder flexion against gravity (touchdown signal) x10; deficits noted in R elbow ext and shoulder flexion compared to L Other Exercises: Attempted bridge x5; did not quite clear hips, but noted muscle activation    General Comments        Pertinent Vitals/Pain Pain Assessment: Faces Faces Pain Scale: Hurts a little bit Pain Location: Mentioned pain R knee, and previous surgery on that knee las summer Pain Descriptors / Indicators: Aching Pain Intervention(s): Monitored during session;Other (comment)(performed therex)    Home Living Family/patient expects to be discharged to:: Private residence Living Arrangements: Parent;Other relatives Available Help at Discharge: Family;Available 24 hours/day Type of Home: House Home Access: Stairs to enter            Prior Function    Gait / Transfers Assistance Needed: 2 person lift to w/c ADL's / Homemaking Assistance Needed: Family assist with all ADLs     PT Goals (current goals can now be found in the care plan  section) Acute Rehab PT Goals Patient Stated Goal: Did not state PT Goal Formulation: With patient/family Time For Goal Achievement: 08/08/19 Potential to Achieve Goals: Fair Progress towards PT goals: Progressing toward goals    Frequency    Min 3X/week      PT Plan Current plan remains appropriate    Co-evaluation              AM-PAC PT "6 Clicks" Mobility   Outcome Measure  Help needed turning from your back to your side while in a flat bed without using bedrails?: A Lot Help needed moving from lying on your back to sitting on the side of a flat bed without using bedrails?: Total Help needed moving to and from a bed to a chair (including a wheelchair)?: Total Help needed standing up from a chair using your arms (e.g., wheelchair or bedside chair)?: Total Help needed to walk in hospital room?:  Total Help needed climbing 3-5 steps with a railing? : Total 6 Click Score: 7    End of Session   Activity Tolerance: Patient tolerated treatment well Patient left: in bed;with call bell/phone within reach;with bed alarm set(bed in semi-chair position) Nurse Communication: Mobility status PT Visit Diagnosis: Other abnormalities of gait and mobility (R26.89);Muscle weakness (generalized) (M62.81)     Time: [id number] PT Time Calculation (min) (ACUTE ONLY): 28 min  Charges:  $Therapeutic Activity: 23-37 mins                     Roney Marion, PT  Acute Rehabilitation Services Pager [phone] Office [place] 07/31/2019, 11:25 AM

## 2019-07-31 NOTE — Progress Notes (Signed)
FPTS Interim Progress Note  S: Paged with notification that patient had fever of 100.8 accompanied by tachycardia between 130-140s. Nurse woke patient in order to administer Tylenol.  Reported to bedside in order to examine patient who reports some chest pain and denies difficulty breathing and requesting to have the room temperature increased as he was cold.   O: BP 123/73 (BP Location: Right Arm)   Pulse (!) 128   Temp (!) 100.8 F (38.2 C) (Oral)   Resp (!) 22   Ht 5\' 11"  (1.803 m)   Wt 54.7 kg   SpO2 98%   BMI 16.82 kg/m    Physical Exam:  Gen: male lying in bed in NAD  Respiratory: difficult to assess bases of lungs given patient's mobility limits, appreciated no wheezing nor crackles in apical and lateral lung fields, patient saturating 98% on RA, no retractions appreciated Cardiovascular: tachycardia without murmurs  Neuro: patient with delayed but appropriate responses to questions  A/P: Reoccurring fever, 100.8 Recent chest CT with trace bilateral pleural effusions and notable right lower lobe process concerning for infection vs aspiration PNA. Given patient's recurrent fever and tachycardia, this could be infectious process developing. -Will re-check patient's temperature in 1 hour and if fever persists, will start antibiotics.   Chest pain in setting of tachycardia:  Patient reports substernal chest pain and tachycardia to 120s during examination  - EKG -patient on IVFs for dehydration    Stark Klein, MD 07/31/2019, 3:03 AM PGY-1, [place] Medicine Service pager [phone]

## 2019-07-31 NOTE — Care Management Important Message (Signed)
Important Message  Patient Details  Name: [name] MRN: [id number] Date of Birth: Nov 03, 1975   Medicare Important Message Given:  Yes     Cornella Emmer 07/31/2019, 4:07 PM

## 2019-08-01 LAB — TYPE AND SCREEN
ABO/RH(D): O POS
Antibody Screen: NEGATIVE
Unit division: 0

## 2019-08-01 LAB — CBC
HCT: 23.6 % — ABNORMAL LOW (ref 39.0–52.0)
Hemoglobin: 7.9 g/dL — ABNORMAL LOW (ref 13.0–17.0)
MCH: 30.6 pg (ref 26.0–34.0)
MCHC: 33.5 g/dL (ref 30.0–36.0)
MCV: 91.5 fL (ref 80.0–100.0)
Platelets: 136 10*3/uL — ABNORMAL LOW (ref 150–400)
RBC: 2.58 MIL/uL — ABNORMAL LOW (ref 4.22–5.81)
RDW: 18.6 % — ABNORMAL HIGH (ref 11.5–15.5)
WBC: 6.2 10*3/uL (ref 4.0–10.5)
nRBC: 2.6 % — ABNORMAL HIGH (ref 0.0–0.2)

## 2019-08-01 LAB — BASIC METABOLIC PANEL
Anion gap: 9 (ref 5–15)
BUN: 11 mg/dL (ref 6–20)
CO2: 24 mmol/L (ref 22–32)
Calcium: 7.6 mg/dL — ABNORMAL LOW (ref 8.9–10.3)
Chloride: 115 mmol/L — ABNORMAL HIGH (ref 98–111)
Creatinine, Ser: 0.7 mg/dL (ref 0.61–1.24)
GFR calc Af Amer: >60 mL/min (ref >60)
GFR calc non Af Amer: >60 mL/min (ref >60)
Glucose, Bld: 95 mg/dL (ref 70–99)
Potassium: 3.7 mmol/L (ref 3.5–5.1)
Sodium: 148 mmol/L — ABNORMAL HIGH (ref 135–145)

## 2019-08-01 LAB — BPAM RBC
Blood Product Expiration Date: 202011042359
ISSUE DATE / TIME: 202010051432
Unit Type and Rh: 5100

## 2019-08-01 MED ORDER — SODIUM CHLORIDE 0.9 % IV SOLN
3.0000 g | Freq: Four times a day (QID) | INTRAVENOUS | Status: DC
  Filled 2019-08-01 (×2): qty 8

## 2019-08-01 MED ORDER — AMOXICILLIN-POT CLAVULANATE 875-125 MG PO TABS
1.0000 | ORAL_TABLET | Freq: Two times a day (BID) | ORAL | Status: DC
  Administered 2019-08-01 – 2019-08-02 (×3): 1 via ORAL
  Filled 2019-08-01 (×3): qty 1

## 2019-08-01 NOTE — Discharge Instructions (Signed)
Discharge summary:  You were admitted due to an increase in your sodium level which was improved with giving fluids.  It is important you follow-up with your primary care physician within 1 week of being discharged.  Is important to recheck your sodium levels and your blood levels with a BMP and a CBC when you go to your primary care physician's office within 1 week after discharge.  - Please return if you experience similar symptoms, high fevers, confusion, chest pain that does not subside within 15 minutes or shortness of breath.

## 2019-08-01 NOTE — Progress Notes (Addendum)
Family Medicine Teaching Service Daily Progress Note Intern Pager: [phone]  Patient name: [name] Medical record number: [id number] Date of birth: 1976/07/31 Age: 43 y.o. Gender: male  Primary Care Provider: Patient, No Pcp Per Consultants: Neurosurgery Code Status: Full  Pt Overview and Major Events to Date:  Admitted 09/29  Assessment and Plan:  Azeem Sabic Howellis a 43 y.o.malepresenting with hypernatremia, anorexia. PMH is significant forbrain tumor, hypertension, intellectual disability, seizures,andvisual impairment.  Hypernatremia Dehydration  Failure to Thrive Serum osmolality 308, urine osmolality 691. Most recent sodium 148, chloride 115 after discontinuing normal saline. -Continue to monitor BMP every 24 hours.  Hypokalemia - improved Potassium 3.7 today 10/6 -Repeat BMP daily  New onset fever: Currently afebrile, last fever 10/5 0400. His fever first occurred 10/4 for approximately an hour and a half and resolved without acetaminophen.  Patient with repeat fever overnight to 101 Fahrenheit.  Patient was given acetaminophen and started on Unasyn.  Fever etiology unknown, chest x-ray shows left lower lobe atelectasis versus pneumonia versus aspiration.  Chest CT shows concern for potential right lower lobe developing pneumonia.  -Monitor fever curve -Continue Unasyn, currently day 2.  10/5- Post rounds addendum: Discontinue Unasyn, initiate Augmentin as patient has been afebrile for greater than 24 hours.  Low Hbg Hemoglobin of 6.5 yesterday, patient s/p 1 unit packed rbcs, hemoglobin 10/6 of 7.9 - Daily cbc  Pressure ulcers-resolving -Wound care reconsulted, appreciate recommendations.  HTN-stable -BP 103/80 -Continue to hold home medication Benicar -Monitor BP daily   Seizures-stable -No recent seizure activity since admission -Continue home medication Depakote twice daily -Seizure precautions  Rt Knee pain-stable Patient without  complaints of knee pain this morning. -Tylenol prn -Continue to monitor for increasing pain  AKI- resolved -Creatinine 0.76 today 10/5.  Weight loss-chronic PSA within normal limits-0.78. CT abdomen and pelvis without IV signs to account for patient's history of anemia or weight loss. Did show concern for developing infection or sequela of aspiration in the right lower lobe of the lung. -Per chart, weight 80kg 12/27/2018 -Nutritional management following -Consider outpatient follow up for evaluation of potential weight loss to include food aversion, GERD, Malignancy, Neglect  FEN/GI: Dysphagia II PPx: Lovenox daily   Disposition: Home with home health when medically stable.  Subjective:  Patient resting comfortably in bed upon my interview, after waking and talking to patient he has no complaints.   Objective: Temp:  [98.2 F (36.8 C)-99.6 F (37.6 C)] 99.6 F (37.6 C) (10/06 0437) Pulse Rate:  [101-115] 105 (10/06 0437) Resp:  [14-20] 14 (10/06 0437) BP: (92-120)/(62-83) 120/79 (10/06 0437) SpO2:  [90 %-100 %] 94 % (10/06 0437) Weight:  [54.2 kg] 54.2 kg (10/05 1956) Physical Exam: General: Alert and oriented in no apparent distress, cachectic Heart: Regular rate and rhythm with no murmurs appreciated Lungs: Right lower lobe with fine crackles, some end expiratory wheezing noted on right.  Left lung sounds clear. Abdomen: Bowel sounds present, no abdominal pain.  Fresh bandage covering sacral ulcer.     Laboratory: Recent Labs  Lab 07/31/19 0449 07/31/19 0551 07/31/19 2021 08/01/19 0418  WBC 5.6  --  6.0 6.2  HGB 6.5* 6.8* 7.7* 7.9*  HCT 20.1* 21.6* 24.0* 23.6*  PLT 129*  --  130* 136*   Recent Labs  Lab 07/25/19 1207  07/26/19 0715  07/30/19 1621 07/31/19 0449 08/01/19 0418  NA 168*   < > 160*   < > 148* 146* 148*  K 3.6   < > 3.2*   < >  3.5 3.3* 3.7  CL >130*   < > 126*   < > 120* 115* 115*  CO2 27   < > 27   < > 23 24 24   BUN 61*   < > 44*   < > 11 10  11   CREATININE 1.31*   < > 1.01   < > 0.74 0.76 0.70  CALCIUM 7.6*   < > 7.8*   < > 7.5* 7.7* 7.6*  PROT 5.5*  --  5.0*  --   --   --   --   BILITOT 0.5  --  1.0  --   --   --   --   ALKPHOS 36*  --  32*  --   --   --   --   ALT 23  --  21  --   --   --   --   AST 78*  --  62*  --   --   --   --   GLUCOSE 89   < > 77   < > 105* 114* 95   < > = values in this interval not displayed.     Imaging/Diagnostic Tests: No new imaging  Lurline Del, DO 08/01/2019, 8:18 AM PGY-1, [place] Intern pager: [phone], text pages welcome

## 2019-08-01 NOTE — Progress Notes (Signed)
Attempted to call patient's mother to update her on his care.  No answer and mailbox was full.  Was able to speak with her when I called back and was able to update her on his care and discuss patient's treatment.  I answered patient's mother's questions and informed her that I would try to give her a daily call to update her on her son's treatment.

## 2019-08-01 NOTE — Progress Notes (Signed)
Occupational Therapy Treatment Patient Details Name: [name] MRN: [id number] DOB: [birth year] Today's Date: 08/01/2019    History of present illness [name] is a 43 y.o. male presenting with hypernatremia, anorexia. PMH is significant for brain tumor, hypertension, intellectual disability, seizures, and visual impairment.   OT comments  Pt MOD A +2 for rolling for posterior pericare after BM in supine and to place Maxi pad for maxi move to chair. Pt able to participate in seated ADL tasks in recliner. MIN A to initiate bringing wash cloth to face to wash face. Hand over hand assist needed initially with pt able to complete task with MOD A for cleanliness. MIN A for oral care in recliner; tactile cues for pt to initiate bringing brush to mouth. Pt requires assist to open toothpaste cap and apply toothpaste to brush. DC plan continues to be appropriate. Will continue to follow acutely for OT needs.   Follow Up Recommendations  Home health OT    Equipment Recommendations  None recommended by OT    Recommendations for Other Services      Precautions / Restrictions Precautions Precautions: Fall Restrictions Weight Bearing Restrictions: No       Mobility Bed Mobility Overal bed mobility: Needs Assistance Bed Mobility: Rolling Rolling: Mod assist         General bed mobility comments: cues for hand placement and directions; rolled R and L to place maxi pad for maxi move and for posterior pericare after BM  Transfers Overall transfer level: Needs assistance Equipment used: Ambulation equipment used             General transfer comment: maxi move to chair    Balance Overall balance assessment: Needs assistance                                         ADL either performed or assessed with clinical judgement   ADL Overall ADL's : Needs assistance/impaired     Grooming: Wash/dry face;Oral care;Sitting;Minimal assistance;Cueing for  sequencing Grooming Details (indicate cue type and reason): pt required MIN tactile/ verbal cues to initiate washing face in recliner; pt required assist to open toothpaste cap and MIN A to bring toothbursh to mouth to initiate oral care; cues for sequencing of task                     Toileting- Clothing Manipulation and Hygiene: Total assistance;Bed level Toileting - Clothing Manipulation Details (indicate cue type and reason): Pt required total A for peri-care in bed following bowel movement. Pt participated in rolling with mod A     Functional mobility during ADLs: Total assistance;+2 for physical assistance(maxi move to chair) General ADL Comments: MIN A for seated ADL to assist with [place] and sequencing of task; cues to initate ADL tasks     Vision Patient Visual Report: Other (comment)(baseline visual deficit)     Perception     Praxis      Cognition Arousal/Alertness: Awake/alert   Overall Cognitive Status: History of cognitive impairments - at baseline                                 General Comments: Pt presents with cognitive impairments at basline; intellectual disability. Overall, very pleasant and follows simple commands.        Exercises  Shoulder Instructions       General Comments      Pertinent Vitals/ Pain       Pain Assessment: No/denies pain  Home Living                                          Prior Functioning/Environment              Frequency  Min 2X/week        Progress Toward Goals  OT Goals(current goals can now be found in the care plan section)  Progress towards OT goals: Progressing toward goals  Acute Rehab OT Goals Patient Stated Goal: Did not state OT Goal Formulation: With patient Time For Goal Achievement: 08/08/19 Potential to Achieve Goals: [place] Discharge plan remains appropriate    Co-evaluation                 AM-PAC OT "6 Clicks" Daily Activity      Outcome Measure   Help from another person eating meals?: A Lot Help from another person taking care of personal grooming?: A Lot Help from another person toileting, which includes using toliet, bedpan, or urinal?: Total Help from another person bathing (including washing, rinsing, drying)?: A Lot Help from another person to put on and taking off regular upper body clothing?: A Lot Help from another person to put on and taking off regular lower body clothing?: Total 6 Click Score: 10    End of Session    OT Visit Diagnosis: Cognitive communication deficit (R41.841);Muscle weakness (generalized) (M62.81);Adult, failure to thrive (R62.7);Other symptoms and signs involving cognitive function   Activity Tolerance Patient tolerated treatment well   Patient Left in chair;with call bell/phone within reach   Nurse Communication Mobility status;Need for lift equipment        Time: [id number] OT Time Calculation (min): 30 min  Charges: OT General Charges $OT Visit: 1 Visit OT Treatments $Self Care/Home Management : 23-37 mins  [place], [place] [facility] [phone] [place] 08/01/2019, 3:59 PM

## 2019-08-01 NOTE — Plan of Care (Signed)
  Problem: Education: Goal: Knowledge of General Education information will improve Description Including pain rating scale, medication(s)/side effects and non-pharmacologic comfort measures Outcome: Progressing   

## 2019-08-01 NOTE — Progress Notes (Signed)
Nutrition Follow-up  DOCUMENTATION CODES:   Underweight  INTERVENTION:    Continue Ensure Enlive po BID, each supplement provides 350 kcal and 20 grams of protein (nectar thick)  Add Magic cup TID with meals, each supplement provides 290 kcal and 9 grams of protein  NUTRITION DIAGNOSIS:   Increased nutrient needs related to wound healing as evidenced by estimated needs.  Ongoing   GOAL:   Patient will meet greater than or equal to 90% of their needs  Progressing   MONITOR:   PO intake, Supplement acceptance, Skin, Weight trends, Labs, I & O's  REASON FOR ASSESSMENT:   Consult Assessment of nutrition requirement/status  ASSESSMENT:   43 y.o. male presenting with hypernatremia, anorexia. PMH is significant for brain tumor, hypertension, intellectual disability, seizures, and visual impairment.   Pt receiving nursing care at time of RD follow up. Unable to perform NFPE. Meal completions documented as 25-100% for pt's last four meals. Per flowsheet, pt drinking 1-2 Ensures daily. Will add Magic Cup TID to maximize kcal and protein. May consider nutrition support if appetite declines as pt is likely malnourished.   Admission weight: 54.7 kg  Current weight: 54.2 kg   I/O: +10,334 ml since admit UOP: 200 ml x 24 hrs   Medications: reviewed  Labs: Na 148 (H)   Diet Order:   Diet Order            DIET DYS 2 Room service appropriate? Yes; Fluid consistency: Nectar Thick  Diet effective now              EDUCATION NEEDS:   Not appropriate for education at this time  Skin:  Skin Assessment: Skin Integrity Issues: Skin Integrity Issues:: Stage II, DTI DTI: R hip Stage II: sacrum  Last BM:  10/6  Height:   Ht Readings from Last 1 Encounters:  07/24/19 5\' 11"  (1.803 m)    Weight:   Wt Readings from Last 1 Encounters:  07/31/19 54.2 kg    Ideal Body Weight:  78 kg  BMI:  Body mass index is 16.67 kg/m.  Estimated Nutritional Needs:   Kcal:   1900-2100  Protein:  95-110 grams  Fluid:  >/= 1.9 L/day   Mariana Single RD, LDN Clinical Nutrition Pager # - [phone]

## 2019-08-02 LAB — CBC
HCT: 23.9 % — ABNORMAL LOW (ref 39.0–52.0)
Hemoglobin: 7.9 g/dL — ABNORMAL LOW (ref 13.0–17.0)
MCH: 30.5 pg (ref 26.0–34.0)
MCHC: 33.1 g/dL (ref 30.0–36.0)
MCV: 92.3 fL (ref 80.0–100.0)
Platelets: 153 10*3/uL (ref 150–400)
RBC: 2.59 MIL/uL — ABNORMAL LOW (ref 4.22–5.81)
RDW: 18.8 % — ABNORMAL HIGH (ref 11.5–15.5)
WBC: 6.2 10*3/uL (ref 4.0–10.5)
nRBC: 2.3 % — ABNORMAL HIGH (ref 0.0–0.2)

## 2019-08-02 LAB — BASIC METABOLIC PANEL
Anion gap: 6 (ref 5–15)
BUN: 10 mg/dL (ref 6–20)
CO2: 27 mmol/L (ref 22–32)
Calcium: 7.7 mg/dL — ABNORMAL LOW (ref 8.9–10.3)
Chloride: 113 mmol/L — ABNORMAL HIGH (ref 98–111)
Creatinine, Ser: 0.7 mg/dL (ref 0.61–1.24)
GFR calc Af Amer: >60 mL/min (ref >60)
GFR calc non Af Amer: >60 mL/min (ref >60)
Glucose, Bld: 98 mg/dL (ref 70–99)
Potassium: 3.6 mmol/L (ref 3.5–5.1)
Sodium: 146 mmol/L — ABNORMAL HIGH (ref 135–145)

## 2019-08-02 MED ORDER — DIVALPROEX SODIUM 125 MG PO CSDR: 500.0000 mg | DELAYED_RELEASE_CAPSULE | Freq: Four times a day (QID) | ORAL | 0 refills | Status: DC

## 2019-08-02 MED ORDER — AMOXICILLIN-POT CLAVULANATE 875-125 MG PO TABS: 1.0000 | ORAL_TABLET | Freq: Two times a day (BID) | ORAL | 0 refills | Status: AC

## 2019-08-02 MED ORDER — ENSURE ENLIVE PO LIQD: 237.0000 mL | Freq: Three times a day (TID) | ORAL | 12 refills | Status: DC

## 2019-08-02 MED FILL — DIVALPROEX SODIUM 125 MG CA: 125 | 25 days supply | Qty: 200 | Fill #0

## 2019-08-02 MED FILL — AMOX-CLAV 875-125 MG TABLET: 875-125 | 3 days supply | Qty: 5 | Fill #0

## 2019-08-02 NOTE — Plan of Care (Signed)
°  Problem: Education: °Goal: Knowledge of General Education information will improve °Description: Including pain rating scale, medication(s)/side effects and non-pharmacologic comfort measures °Outcome: Adequate for Discharge °  °Problem: Health Behavior/Discharge Planning: °Goal: Ability to manage health-related needs will improve °Outcome: Adequate for Discharge °  °Problem: Clinical Measurements: °Goal: Ability to maintain clinical measurements within normal limits will improve °Outcome: Adequate for Discharge °Goal: Will remain free from infection °Outcome: Adequate for Discharge °Goal: Diagnostic test results will improve °Outcome: Adequate for Discharge °Goal: Respiratory complications will improve °Outcome: Adequate for Discharge °Goal: Cardiovascular complication will be avoided °Outcome: Adequate for Discharge °  °Problem: Activity: °Goal: Risk for activity intolerance will decrease °Outcome: Adequate for Discharge °  °Problem: Nutrition: °Goal: Adequate nutrition will be maintained °Outcome: Adequate for Discharge °  °Problem: Elimination: °Goal: Will not experience complications related to bowel motility °Outcome: Adequate for Discharge °Goal: Will not experience complications related to urinary retention °Outcome: Adequate for Discharge °  °Problem: Safety: °Goal: Ability to remain free from injury will improve °Outcome: Adequate for Discharge °  °Problem: Skin Integrity: °Goal: Risk for impaired skin integrity will decrease °Outcome: Adequate for Discharge °  °

## 2019-08-02 NOTE — Plan of Care (Signed)

## 2019-08-02 NOTE — Progress Notes (Signed)
DISCHARGE NOTE HOME [place] to be discharged Home per MD order. Discussed prescriptions and follow up appointments with the mother by Day shift RN.   Prescriptions given to patient/PTAR.   Skin clean, dry and intact without evidence of skin break down, no evidence of skin tears noted. IV catheter discontinued intact. Site without signs and symptoms of complications. Dressing and pressure applied. Pt denies pain at the site currently. No complaints noted.  Patient free of lines, drains, and wounds.   An After Visit Summary (AVS) was printed and given to the patient. Patient escorted via wheelchair, and discharged home via private auto.  Viviano Simas, RN

## 2019-08-02 NOTE — TOC Transition Note (Signed)
Transition of Care [facility]) - CM/SW Discharge Note   Patient Details  Name: [name] MRN: [id number] Date of Birth: 03/17/1976  [facility]) CM/SW Contact:  Bartholomew Crews, RN Phone Number: [phone] 08/02/2019, 4:12 PM   Clinical Narrative:    Spoke with patient at bedside about transition home today. PTA home with mom and sister. Notified [facility] of transition home - [place] orders provided for PT, OT - requested RN be added per previous CM discussion. PTAR notified for transport home. Patient advised that wheelchair may be picked up by family at [facility] store front [address] d/t no one able to pick up wheelchair from the hospital. No further transition of care needs identified.    Final next level of care: [place] Barriers to Discharge: No Barriers Identified   Patient Goals and CMS Choice Patient states their goals for this hospitalization and ongoing recovery are:: to go home CMS Medicare.gov Compare Post Acute Care list provided to:: Patient Choice offered to / list presented to : Patient  Discharge Placement                       Discharge Plan and Services   Discharge Planning Services: CM Consult Post Acute Care Choice: Home Health, Durable Medical Equipment          DME Arranged: Wheelchair manual DME Agency: [employer] Date DME Agency Contacted: 08/02/19 Time DME Agency Contacted: [id number] Representative spoke with at DME Agency: Thedore Mins HH Arranged: RN, PT, OT [facility] Agency: [place] Date [place]: 08/02/19 Time [place]: 1610 Representative spoke with at [place]: cory  Social Determinants of Health ([place]) Interventions     Readmission Risk Interventions No flowsheet data found.

## 2019-08-02 NOTE — Progress Notes (Signed)
Physical Therapy Treatment Patient Details Name: [name] MRN: [id number] DOB: 01-Oct-1976 Today's Date: 08/02/2019    History of Present Illness [name] is a 43 y.o. male presenting with hypernatremia, anorexia. PMH is significant for brain tumor, hypertension, intellectual disability, seizures, and visual impairment.    PT Comments    Pt continues to progress with functional mobility, able to tolerate activity sitting EOB and initiate standing from EOB. Pt required max-total assist for partial sit<>stands from EOB, pt maintains sitting balance with stand by assist with L UE support and feet supported. Pt declines getting up to chair this session secondary to fatigue and LE pain despite therapist encouragement. Pt would continue to benefit from home health level follow up therapy in order to address impairments and maximize independence with functional mobility.      Follow Up Recommendations  Home health PT;Supervision/Assistance - 24 hour     Equipment Recommendations  Other (comment)(family has equipment, consider hospital bed)    Recommendations for Other Services       Precautions / Restrictions Precautions Precautions: Fall Restrictions Weight Bearing Restrictions: No    Mobility  Bed Mobility Overal bed mobility: Needs Assistance Bed Mobility: Supine to Sit;Sit to Supine     Supine to sit: Max assist Sit to supine: Max assist   General bed mobility comments: cues for hand placement and techniques, pt initiates bringing LEs off the bed for supine>sit, requires assist for trunk elevation. Requires assist for trunk and LE management during sit>supine this session. Performed x 5 partial bridges in supine to assist with scooting up in bed, unable to clear hips.  Transfers Overall transfer level: Needs assistance   Transfers: Sit to/from Stand Sit to Stand: Max assist   Squat pivot transfers: (pt declines OOB transfer this session)     General  transfer comment: Pt performed x 2 partial sit<>stands from EOB with max assist from therapist, pt clears buttocks from bed but unable to come up to full standingfull hip extension      Balance Overall balance assessment: Needs assistance Sitting-balance support: Single extremity supported;Feet supported Sitting balance-Leahy Scale: Poor Sitting balance - Comments: pt maintains sitting balance with supervision when using L UE for support on bedrail, otherwise mod assist for sitting balance without UE support Postural control: Left lateral lean   Standing balance-Leahy Scale: Zero Standing balance comment: total assist for standing balance this session                            Cognition Arousal/Alertness: Awake/alert Behavior During Therapy: WFL for tasks assessed/performed Overall Cognitive Status: History of cognitive impairments - at baseline                                 General Comments: Pt presents with cognitive impairments at basline; intellectual disability. Overall, very pleasant and follows simple commands.      Exercises      General Comments        Pertinent Vitals/Pain Pain Assessment: Faces Faces Pain Scale: Hurts a little bit Pain Location: pt reports R LE hurts Pain Descriptors / Indicators: Aching Pain Intervention(s): Limited activity within patient's tolerance;Monitored during session    Home Living                      Prior Function  PT Goals (current goals can now be found in the care plan section) Acute Rehab PT Goals Potential to Achieve Goals: Fair Progress towards PT goals: Progressing toward goals    Frequency    Min 3X/week      PT Plan Current plan remains appropriate    Co-evaluation              AM-PAC PT "6 Clicks" Mobility   Outcome Measure  Help needed turning from your back to your side while in a flat bed without using bedrails?: A Lot Help needed moving from  lying on your back to sitting on the side of a flat bed without using bedrails?: Total Help needed moving to and from a bed to a chair (including a wheelchair)?: Total Help needed standing up from a chair using your arms (e.g., wheelchair or bedside chair)?: Total Help needed to walk in hospital room?: Total Help needed climbing 3-5 steps with a railing? : Total 6 Click Score: 7    End of Session Equipment Utilized During Treatment: Gait belt Activity Tolerance: Patient tolerated treatment well;Patient limited by pain Patient left: in bed;with bed alarm set;with call bell/phone within reach Nurse Communication: Mobility status PT Visit Diagnosis: Other abnormalities of gait and mobility (R26.89);Muscle weakness (generalized) (M62.81)     Time: [id number] PT Time Calculation (min) (ACUTE ONLY): 20 min  Charges:  $Therapeutic Activity: 8-22 mins                     Netta Corrigan, PT, DPT, CSRS Acute Rehab Office [place] 08/02/2019, 11:46 AM

## 2019-10-14 ENCOUNTER — Inpatient Hospital Stay (HOSPITAL_COMMUNITY)
Admission: EM | Admit: 2019-10-14 | Discharge: 2019-11-06 | DRG: 870 | Disposition: A | Discharge status: Home Health | Payer: Medicare Other | Attending: Internal Medicine | Admitting: Internal Medicine

## 2019-10-14 ENCOUNTER — Emergency Department (HOSPITAL_COMMUNITY): Payer: Medicare Other

## 2019-10-14 DIAGNOSIS — E43 Unspecified severe protein-calorie malnutrition: Secondary | ICD-10-CM | POA: Diagnosis present

## 2019-10-14 DIAGNOSIS — R64 Cachexia: Secondary | ICD-10-CM | POA: Diagnosis present

## 2019-10-14 DIAGNOSIS — R571 Hypovolemic shock: Secondary | ICD-10-CM | POA: Diagnosis present

## 2019-10-14 DIAGNOSIS — Z86011 Personal history of benign neoplasm of the brain: Secondary | ICD-10-CM

## 2019-10-14 DIAGNOSIS — Z23 Encounter for immunization: Secondary | ICD-10-CM | POA: Diagnosis present

## 2019-10-14 DIAGNOSIS — E870 Hyperosmolality and hypernatremia: Secondary | ICD-10-CM | POA: Diagnosis present

## 2019-10-14 DIAGNOSIS — L89622 Pressure ulcer of left heel, stage 2: Secondary | ICD-10-CM | POA: Diagnosis not present

## 2019-10-14 DIAGNOSIS — Z681 Body mass index (BMI) 19 or less, adult: Secondary | ICD-10-CM

## 2019-10-14 DIAGNOSIS — G40909 Epilepsy, unspecified, not intractable, without status epilepticus: Secondary | ICD-10-CM | POA: Diagnosis present

## 2019-10-14 DIAGNOSIS — Z7401 Bed confinement status: Secondary | ICD-10-CM

## 2019-10-14 DIAGNOSIS — N10 Acute tubulo-interstitial nephritis: Secondary | ICD-10-CM | POA: Diagnosis not present

## 2019-10-14 DIAGNOSIS — J9601 Acute respiratory failure with hypoxia: Secondary | ICD-10-CM | POA: Diagnosis present

## 2019-10-14 DIAGNOSIS — Z982 Presence of cerebrospinal fluid drainage device: Secondary | ICD-10-CM

## 2019-10-14 DIAGNOSIS — J9602 Acute respiratory failure with hypercapnia: Secondary | ICD-10-CM | POA: Diagnosis present

## 2019-10-14 DIAGNOSIS — E860 Dehydration: Secondary | ICD-10-CM | POA: Diagnosis present

## 2019-10-14 DIAGNOSIS — N390 Urinary tract infection, site not specified: Secondary | ICD-10-CM | POA: Diagnosis present

## 2019-10-14 DIAGNOSIS — D696 Thrombocytopenia, unspecified: Secondary | ICD-10-CM | POA: Diagnosis present

## 2019-10-14 DIAGNOSIS — Z20822 Contact with and (suspected) exposure to covid-19: Secondary | ICD-10-CM | POA: Diagnosis present

## 2019-10-14 DIAGNOSIS — Z978 Presence of other specified devices: Secondary | ICD-10-CM

## 2019-10-14 DIAGNOSIS — G9349 Other encephalopathy: Secondary | ICD-10-CM | POA: Diagnosis present

## 2019-10-14 DIAGNOSIS — H5704 Mydriasis: Secondary | ICD-10-CM | POA: Diagnosis not present

## 2019-10-14 DIAGNOSIS — E878 Other disorders of electrolyte and fluid balance, not elsewhere classified: Secondary | ICD-10-CM | POA: Diagnosis present

## 2019-10-14 DIAGNOSIS — R4020 Unspecified coma: Secondary | ICD-10-CM | POA: Diagnosis present

## 2019-10-14 DIAGNOSIS — N179 Acute kidney failure, unspecified: Secondary | ICD-10-CM | POA: Diagnosis present

## 2019-10-14 DIAGNOSIS — D638 Anemia in other chronic diseases classified elsewhere: Secondary | ICD-10-CM | POA: Diagnosis present

## 2019-10-14 DIAGNOSIS — I629 Nontraumatic intracranial hemorrhage, unspecified: Secondary | ICD-10-CM | POA: Diagnosis present

## 2019-10-14 DIAGNOSIS — I10 Essential (primary) hypertension: Secondary | ICD-10-CM | POA: Diagnosis present

## 2019-10-14 DIAGNOSIS — Z9289 Personal history of other medical treatment: Secondary | ICD-10-CM

## 2019-10-14 DIAGNOSIS — G9340 Encephalopathy, unspecified: Secondary | ICD-10-CM | POA: Diagnosis not present

## 2019-10-14 DIAGNOSIS — R739 Hyperglycemia, unspecified: Secondary | ICD-10-CM | POA: Diagnosis not present

## 2019-10-14 DIAGNOSIS — Z4659 Encounter for fitting and adjustment of other gastrointestinal appliance and device: Secondary | ICD-10-CM

## 2019-10-14 DIAGNOSIS — B59 Pneumocystosis: Secondary | ICD-10-CM | POA: Diagnosis not present

## 2019-10-14 DIAGNOSIS — R6250 Unspecified lack of expected normal physiological development in childhood: Secondary | ICD-10-CM | POA: Diagnosis not present

## 2019-10-14 DIAGNOSIS — Z833 Family history of diabetes mellitus: Secondary | ICD-10-CM

## 2019-10-14 DIAGNOSIS — E876 Hypokalemia: Secondary | ICD-10-CM | POA: Diagnosis present

## 2019-10-14 DIAGNOSIS — R1312 Dysphagia, oropharyngeal phase: Secondary | ICD-10-CM | POA: Diagnosis not present

## 2019-10-14 DIAGNOSIS — J690 Pneumonitis due to inhalation of food and vomit: Secondary | ICD-10-CM | POA: Diagnosis present

## 2019-10-14 DIAGNOSIS — J189 Pneumonia, unspecified organism: Secondary | ICD-10-CM

## 2019-10-14 DIAGNOSIS — Z8249 Family history of ischemic heart disease and other diseases of the circulatory system: Secondary | ICD-10-CM

## 2019-10-14 DIAGNOSIS — G9341 Metabolic encephalopathy: Secondary | ICD-10-CM | POA: Diagnosis present

## 2019-10-14 DIAGNOSIS — Z79899 Other long term (current) drug therapy: Secondary | ICD-10-CM

## 2019-10-14 DIAGNOSIS — D509 Iron deficiency anemia, unspecified: Secondary | ICD-10-CM | POA: Diagnosis present

## 2019-10-14 DIAGNOSIS — R6521 Severe sepsis with septic shock: Secondary | ICD-10-CM | POA: Diagnosis present

## 2019-10-14 DIAGNOSIS — A419 Sepsis, unspecified organism: Secondary | ICD-10-CM | POA: Diagnosis not present

## 2019-10-14 DIAGNOSIS — R579 Shock, unspecified: Secondary | ICD-10-CM | POA: Diagnosis not present

## 2019-10-14 DIAGNOSIS — R197 Diarrhea, unspecified: Secondary | ICD-10-CM | POA: Diagnosis not present

## 2019-10-14 DIAGNOSIS — R1310 Dysphagia, unspecified: Secondary | ICD-10-CM

## 2019-10-14 DIAGNOSIS — J810 Acute pulmonary edema: Secondary | ICD-10-CM | POA: Diagnosis not present

## 2019-10-14 DIAGNOSIS — E162 Hypoglycemia, unspecified: Secondary | ICD-10-CM | POA: Diagnosis not present

## 2019-10-14 DIAGNOSIS — R4182 Altered mental status, unspecified: Secondary | ICD-10-CM | POA: Diagnosis not present

## 2019-10-14 DIAGNOSIS — A4151 Sepsis due to Escherichia coli [E. coli]: Secondary | ICD-10-CM | POA: Diagnosis present

## 2019-10-14 DIAGNOSIS — R680 Hypothermia, not associated with low environmental temperature: Secondary | ICD-10-CM | POA: Diagnosis present

## 2019-10-14 DIAGNOSIS — F70 Mild intellectual disabilities: Secondary | ICD-10-CM | POA: Diagnosis present

## 2019-10-14 DIAGNOSIS — B9620 Unspecified Escherichia coli [E. coli] as the cause of diseases classified elsewhere: Secondary | ICD-10-CM | POA: Diagnosis not present

## 2019-10-14 DIAGNOSIS — J9690 Respiratory failure, unspecified, unspecified whether with hypoxia or hypercapnia: Secondary | ICD-10-CM

## 2019-10-14 DIAGNOSIS — Z452 Encounter for adjustment and management of vascular access device: Secondary | ICD-10-CM

## 2019-10-14 DIAGNOSIS — J9600 Acute respiratory failure, unspecified whether with hypoxia or hypercapnia: Secondary | ICD-10-CM

## 2019-10-14 DIAGNOSIS — Z8042 Family history of malignant neoplasm of prostate: Secondary | ICD-10-CM

## 2019-10-14 LAB — CREATININE, SERUM
Creatinine, Ser: 5.3 mg/dL — ABNORMAL HIGH (ref 0.61–1.24)
GFR calc Af Amer: 14 mL/min — ABNORMAL LOW (ref >60)
GFR calc non Af Amer: 12 mL/min — ABNORMAL LOW (ref >60)

## 2019-10-14 LAB — COMPREHENSIVE METABOLIC PANEL
ALT: 18 U/L (ref 0–44)
AST: 54 U/L — ABNORMAL HIGH (ref 15–41)
Albumin: 2.7 g/dL — ABNORMAL LOW (ref 3.5–5.0)
Alkaline Phosphatase: 43 U/L (ref 38–126)
BUN: 166 mg/dL — ABNORMAL HIGH (ref 6–20)
CO2: 18 mmol/L — ABNORMAL LOW (ref 22–32)
Calcium: 8 mg/dL — ABNORMAL LOW (ref 8.9–10.3)
Chloride: >130 mmol/L (ref 98–111)
Creatinine, Ser: 5.99 mg/dL — ABNORMAL HIGH (ref 0.61–1.24)
GFR calc Af Amer: 12 mL/min — ABNORMAL LOW (ref >60)
GFR calc non Af Amer: 11 mL/min — ABNORMAL LOW (ref >60)
Glucose, Bld: 159 mg/dL — ABNORMAL HIGH (ref 70–99)
Potassium: 2.8 mmol/L — ABNORMAL LOW (ref 3.5–5.1)
Sodium: 175 mmol/L (ref 135–145)
Total Bilirubin: 1.3 mg/dL — ABNORMAL HIGH (ref 0.3–1.2)
Total Protein: 6.9 g/dL (ref 6.5–8.1)

## 2019-10-14 LAB — CBC WITH DIFFERENTIAL/PLATELET
Abs Immature Granulocytes: 0.04 10*3/uL (ref 0.00–0.07)
Basophils Absolute: 0 10*3/uL (ref 0.0–0.1)
Basophils Relative: 1 %
Eosinophils Absolute: 0 10*3/uL (ref 0.0–0.5)
Eosinophils Relative: 0 %
HCT: 44.6 % (ref 39.0–52.0)
Hemoglobin: 12.7 g/dL — ABNORMAL LOW (ref 13.0–17.0)
Immature Granulocytes: 2 %
Lymphocytes Relative: 38 %
Lymphs Abs: 0.9 10*3/uL (ref 0.7–4.0)
MCH: 31.8 pg (ref 26.0–34.0)
MCHC: 28.5 g/dL — ABNORMAL LOW (ref 30.0–36.0)
MCV: 111.5 fL — ABNORMAL HIGH (ref 80.0–100.0)
Monocytes Absolute: 0.2 10*3/uL (ref 0.1–1.0)
Monocytes Relative: 9 %
Neutro Abs: 1.2 10*3/uL — ABNORMAL LOW (ref 1.7–7.7)
Neutrophils Relative %: 50 %
Platelets: 53 10*3/uL — ABNORMAL LOW (ref 150–400)
RBC: 4 MIL/uL — ABNORMAL LOW (ref 4.22–5.81)
RDW: 19.1 % — ABNORMAL HIGH (ref 11.5–15.5)
WBC: 2.3 10*3/uL — ABNORMAL LOW (ref 4.0–10.5)
nRBC: 3.9 % — ABNORMAL HIGH (ref 0.0–0.2)

## 2019-10-14 LAB — POCT I-STAT EG7
Acid-base deficit: 3 mmol/L — ABNORMAL HIGH (ref 0.0–2.0)
Bicarbonate: 22.1 mmol/L (ref 20.0–28.0)
Calcium, Ion: 0.96 mmol/L — ABNORMAL LOW (ref 1.15–1.40)
HCT: 37 % — ABNORMAL LOW (ref 39.0–52.0)
Hemoglobin: 12.6 g/dL — ABNORMAL LOW (ref 13.0–17.0)
O2 Saturation: 100 %
Potassium: 3.1 mmol/L — ABNORMAL LOW (ref 3.5–5.1)
Sodium: 175 mmol/L (ref 135–145)
TCO2: 23 mmol/L (ref 22–32)
pCO2, Ven: 37.8 mmHg — ABNORMAL LOW (ref 44.0–60.0)
pH, Ven: 7.375 (ref 7.250–7.430)
pO2, Ven: 241 mmHg — ABNORMAL HIGH (ref 32.0–45.0)

## 2019-10-14 LAB — PROTIME-INR
INR: 1.2 (ref 0.8–1.2)
Prothrombin Time: 15.4 seconds — ABNORMAL HIGH (ref 11.4–15.2)

## 2019-10-14 LAB — POC SARS CORONAVIRUS 2 AG -  ED: SARS Coronavirus 2 Ag: NEGATIVE

## 2019-10-14 LAB — LACTIC ACID, PLASMA
Lactic Acid, Venous: 4.1 mmol/L (ref 0.5–1.9)
Lactic Acid, Venous: 4.3 mmol/L (ref 0.5–1.9)

## 2019-10-14 LAB — RESPIRATORY PANEL BY RT PCR (FLU A&B, COVID)
Influenza A by PCR: NEGATIVE
Influenza B by PCR: NEGATIVE
SARS Coronavirus 2 by RT PCR: NEGATIVE

## 2019-10-14 LAB — APTT: aPTT: 38 seconds — ABNORMAL HIGH (ref 24–36)

## 2019-10-14 MED ORDER — LACTATED RINGERS IV BOLUS (SEPSIS)
250.0000 mL | Freq: Once | INTRAVENOUS | Status: AC
  Administered 2019-10-14: 21:00:00 250 mL via INTRAVENOUS

## 2019-10-14 MED ORDER — METRONIDAZOLE IN NACL 5-0.79 MG/ML-% IV SOLN
500.0000 mg | Freq: Once | INTRAVENOUS | Status: AC
  Administered 2019-10-14: 500 mg via INTRAVENOUS
  Filled 2019-10-14: qty 100

## 2019-10-14 MED ORDER — SODIUM CHLORIDE 0.9 % IV SOLN
2.0000 g | Freq: Once | INTRAVENOUS | Status: AC
  Administered 2019-10-14: 2 g via INTRAVENOUS
  Filled 2019-10-14: qty 2

## 2019-10-14 MED ORDER — LACTATED RINGERS IV BOLUS (SEPSIS)
1000.0000 mL | Freq: Once | INTRAVENOUS | Status: AC
  Administered 2019-10-14: 1000 mL via INTRAVENOUS

## 2019-10-14 MED ORDER — HEPARIN SODIUM (PORCINE) 5000 UNIT/ML IJ SOLN: 5000.0000 [IU] | Freq: Three times a day (TID) | INTRAMUSCULAR | Status: DC

## 2019-10-14 MED ORDER — VANCOMYCIN HCL IN DEXTROSE 1-5 GM/200ML-% IV SOLN
1000.0000 mg | Freq: Once | INTRAVENOUS | Status: AC
  Administered 2019-10-14: 1000 mg via INTRAVENOUS
  Filled 2019-10-14: qty 200

## 2019-10-14 MED ORDER — SODIUM CHLORIDE 0.9 % IV SOLN
250.0000 mL | INTRAVENOUS | Status: DC
  Administered 2019-10-14 – 2019-10-21 (×4): 250 mL via INTRAVENOUS

## 2019-10-14 MED ORDER — LACTATED RINGERS IV BOLUS (SEPSIS)
500.0000 mL | Freq: Once | INTRAVENOUS | Status: AC
  Administered 2019-10-14: 1000 mL via INTRAVENOUS

## 2019-10-14 MED ORDER — SODIUM CHLORIDE 0.9 % IV SOLN
1.0000 g | INTRAVENOUS | Status: DC
  Administered 2019-10-15 – 2019-10-16 (×2): 1 g via INTRAVENOUS
  Filled 2019-10-14 (×3): qty 1

## 2019-10-14 MED ORDER — DIVALPROEX SODIUM 125 MG PO CSDR
500.0000 mg | DELAYED_RELEASE_CAPSULE | Freq: Three times a day (TID) | ORAL | Status: DC
  Filled 2019-10-14 (×3): qty 4

## 2019-10-14 MED ORDER — NOREPINEPHRINE 4 MG/250ML-% IV SOLN
2.0000 ug/min | INTRAVENOUS | Status: DC
  Administered 2019-10-14: 2 ug/min via INTRAVENOUS
  Filled 2019-10-14 (×3): qty 250
  Filled 2019-10-14: qty 500

## 2019-10-14 MED ORDER — VANCOMYCIN VARIABLE DOSE PER UNSTABLE RENAL FUNCTION (PHARMACIST DOSING): Status: DC

## 2019-10-14 MED ORDER — DEXTROSE 5 % IV SOLN: INTRAVENOUS | Status: DC

## 2019-10-14 NOTE — H&P (Signed)
NAME:  [name], MRN:  [id number], DOB:  10-13-1976, LOS: 0 ADMISSION DATE:  10/14/2019, CONSULTATION DATE:  10/14/19 REFERRING MD: Zenia Resides , CHIEF COMPLAINT:  sepsis   Brief History   43 y.o. M with PMH of childhood brain tumor, mental retardation, seizures who resides at a group home who presented to the ED with lethargy and hypoxia with several days of poor po intake.  Found to be severely hypernatrenic, hypokalemic, hypotensive and septic likely from aspiration pneumonia.   History of present illness   [name] is a 43 y.o. 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  History taken from sister via phone.  Apparently, pt has not been eating well for the last two days and then became more lethargic and was brought in by EMS.  Per sister, at baseline patient is interactive and will carry on a conversation.    On arrival, pt was minimally interactive and hypoxic, placed on non-rebreather mask.  Labs showed severe hypernatremia of 175, K 2.8, lactic acid 4.3, Creatinine 5.9.  CXR with R sided infiltrate.  Pt given 3.5L NS, started on peripheral Levophed, given Vanc, Cefepime and Flagyl and PCCM consulted for admission.  Pt had a similar presentation last month and sister confirms full code.  Past Medical History   has a past medical history of Brain tumor (benign) ([place]), Hypertension, Mental retardation, mild (I.Q. 50-70), and Seizures ([place]).   Significant Hospital Events   12/20 Admit to PCCM  Consults:    Procedures:    Significant Diagnostic Tests:  12/20 CXR>>New RIGHT lung airspace disease suspicious for pneumonia or possibly aspiration.  Micro Data:  12/20 BCx2>> 12/20 UC>> 12/20 Sars-CoV-2>>negative 12/20 Sputum culture>> 12/20 Urine strep>> 12/20 Urine legionella>>  Antimicrobials:  Vancomycin 12/20- Cefepime 12/20- Flagyl 12/20 only  Interim history/subjective:  Attempted  central line placement bilateral IJ's, veins very collapsible.  BP improving with volume resuscitation, consider femoral line if needed  Objective   Blood pressure (!) 66/37, pulse 73, temperature (!) 90.5 F (32.5 C), temperature source Rectal, resp. rate (!) 29, height 5\' 11"  (1.803 m), weight 50 kg, SpO2 100 %.        Intake/Output Summary (Last 24 hours) at 10/14/2019 2158 Last data filed at 10/14/2019 2127 Gross per 24 hour  Intake 1750 ml  Output --  Net 1750 ml   Filed Weights   10/14/19 2104  Weight: 50 kg    General:  Very cachectic, thin, malnourished M HEENT: MM pink/dry Neuro: opens eyes to voice, not otherwise responsive CV: s1s2 rrr, no m/r/g PULM:  Decreased breath sounds bilaterally GI: soft, bsx4  Extremities: warm/dry, thin, no edema  Skin: no rashes or lesions   Resolved Hospital Problem list   n/a  Assessment & Plan:   Septic Shock  -Likely secondary to aspiration PNA  -received 3.5 L isotonic IVF in the ED P: -Continue Vanc/cefepime  -Blood cultures, urine strep and legionella, respiratory cultures -trend lactic acid, continued volume resuscitation based on sodium response -Continue peripheral levophed, attempt femoral/subclavian central access if needed, BP improving with volume resuscitation -Maintaining oxygen saturations and airway with non-rebreather mask currently, transition to HFNC, at risk of respiratory compromise especially with poor mental status, however will attempt to avoid intubation unless absolutely necessary as he would be difficult to wean from the vent  Encephalopathy -likely secondary to sepsis, metabolic derangement  P: -check stat head CT, replete  K   Severe hypernatremia, hypokalemia, hyperchloremia secondary to dehydration and poor nutritional status -cachectic, admitted last month with the same presentation P: -at risk of re-feeding syndrome, follow electrolytes and phos,  will need nutrition consult -poor  prognosis, spoke with sister and she confirms full code, consider palliative care consult  Non-oliguric Acute Kidney Injury -Creatinine 5.99, baseline <1.0 -suspect secondary to volume depletion  P: -continue volume depletion and follow BMET  -monitor UOP, if not improving will need nephrology consult    History of brain tumor and resection with VP shunt and Seizure Disorder -continue depakote P: -head CT pending to eval for worsening hydrocephalus or other acute event   Thrombocytopenia, leukopenia -platelets 35, no sign of bleeding -likely secondary to sepsis P: -follow CBC, may need platelets if continues to drop <20k   Best practice:  Diet: NPO Pain/Anxiety/Delirium protocol (if indicated): fentanyl, propofol VAP protocol (if indicated): yes DVT prophylaxis: scd GI prophylaxis: PPI Glucose control: n/a Mobility: bed rest Code Status: full Family Communication: spoke with sister and updated with ICU admission Disposition: ICU  Labs   CBC: Recent Labs  Lab 10/14/19 2000 10/14/19 2011  WBC 2.3*  --   NEUTROABS 1.2*  --   HGB 12.7* 12.6*  HCT 44.6 37.0*  MCV 111.5*  --   PLT 53*  --     Basic Metabolic Panel: Recent Labs  Lab 10/14/19 2000 10/14/19 2011  NA 175* 175*  K 2.8* 3.1*  CL >130*  --   CO2 18*  --   GLUCOSE 159*  --   BUN 166*  --   CREATININE 5.99*  --   CALCIUM 8.0*  --    GFR: Estimated Creatinine Clearance: 11.4 mL/min (A) (by C-G formula based on SCr of 5.99 mg/dL (H)). Recent Labs  Lab 10/14/19 2000 10/14/19 2030  WBC 2.3*  --   LATICACIDVEN  --  4.1*    Liver Function Tests: Recent Labs  Lab 10/14/19 2000  AST 54*  ALT 18  ALKPHOS 43  BILITOT 1.3*  PROT 6.9  ALBUMIN 2.7*   No results for input(s): LIPASE, AMYLASE in the last 168 hours. No results for input(s): AMMONIA in the last 168 hours.  ABG    Component Value Date/Time   HCO3 22.1 10/14/2019 2011   TCO2 23 10/14/2019 2011   ACIDBASEDEF 3.0 (H)  10/14/2019 2011   O2SAT 100.0 10/14/2019 2011     Coagulation Profile: Recent Labs  Lab 10/14/19 2030  INR 1.2    Cardiac Enzymes: No results for input(s): CKTOTAL, CKMB, CKMBINDEX, TROPONINI in the last 168 hours.  HbA1C: No results found for: HGBA1C  CBG: No results for input(s): GLUCAP in the last 168 hours.  Review of Systems:   Unable to obtain secondary to AMS  Past Medical History  He,  has a past medical history of Brain tumor (benign) ([place]), Hypertension, Mental retardation, mild (I.Q. 50-70), and Seizures ([place]).   Surgical History    Past Surgical History:  Procedure Laterality Date  . BRAIN SURGERY       Social History   reports that he has never smoked. He has never used smokeless tobacco. He reports that he does not drink alcohol or use drugs.   Family History   His family history includes Atrial fibrillation in his mother; Diabetes in his mother; Hypertension in his mother and sister; Multiple sclerosis in his sister and sister; Prostate cancer in his father.   Allergies No Known Allergies   Home  Medications  Prior to Admission medications   Medication Sig Start Date End Date Taking? Authorizing Provider  divalproex (DEPAKOTE SPRINKLE) 125 MG capsule Take 4 capsules (500 mg total) by mouth 4 (four) times daily. 08/02/19   Rory Percy, DO  feeding supplement, ENSURE ENLIVE, (ENSURE ENLIVE) LIQD Take 237 mLs by mouth 3 (three) times daily between meals. 08/02/19   Rory Percy, DO     Critical care time: 65 minutes     CRITICAL CARE Performed by: [name] Jezlyn Westerfield   Total critical care time: 65 minutes  Critical care time was exclusive of separately billable procedures and treating other patients.  Critical care was necessary to treat or prevent imminent or life-threatening deterioration.  Critical care was time spent personally by me on the following activities: development of treatment plan with patient and/or surrogate as well as  nursing, discussions with consultants, evaluation of patient's response to treatment, examination of patient, obtaining history from patient or surrogate, ordering and performing treatments and interventions, ordering and review of laboratory studies, ordering and review of radiographic studies, pulse oximetry and re-evaluation of patient's condition.   [name] Kynlea Blackston, PA-C [facility]  Pager# [phone], if no answer [phone]

## 2019-10-14 NOTE — ED Triage Notes (Signed)
Pt transported from ?group home with change in mental status, LSN 1 hour ago, per others in the home pt has been yelling out today, normally communicative.  EMS VS 66/27, 02 61%, CBG 130, HR 90-160. IV est 669ml NS given, pt on NRB

## 2019-10-14 NOTE — ED Notes (Signed)
CCM and IV team at bedside. Pt continues to have minimal response to tactile stimulation.  Cardiac rhythm converted out of afib at this time, repeat ECG done and exported

## 2019-10-14 NOTE — ED Provider Notes (Signed)
[facility] EMERGENCY DEPARTMENT Provider Note   CSN: [id number] Arrival date & time: 10/14/19  1937     History Chief Complaint  Patient presents with  . Altered Mental Status    [name] is a 43 y.o. male.  43 year old male with history of mental retardation who presents via EMS due to decreased level consciousness.  According to EMS, the patient has not been as responsive over several days and worse over the last hour.  On their arrival patient was hypotensive and also hypothermic.  IV was established and given IV fluids and transported here.  No further history obtainable due to his current state.        Past Medical History:  Diagnosis Date  . Brain tumor (benign) ([place])   . Hypertension   . Mental retardation, mild (I.Q. 50-70)   . Seizures [facility])     Patient Active Problem List   Diagnosis Date Noted  . Pressure injury of skin 07/25/2019  . Hypernatremia 07/24/2019  . Acute hypernatremia 07/24/2019  . Right leg weakness 04/03/2016  . Abnormality of gait 03/12/2016  . MENTAL RETARDATION, MILD 09/05/2007  . 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 HYPERTENSION 09/05/2007  . 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 HEMORRHAGE, INTRACEREBRAL 06/28/2007  . 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 H/O craniotomy 06/28/2007  . INFECTION DUE TO NERVOUS SYSTEM DEVICE/GRAFT 06/22/2007    Past Surgical History:  Procedure Laterality Date  . BRAIN SURGERY         Family History  Problem Relation Age of Onset  . Diabetes Mother   . Hypertension Mother   . Atrial fibrillation Mother   . Prostate cancer Father   . Multiple sclerosis Sister        Deceased  . Multiple sclerosis Sister        Deceased  . Hypertension Sister     Social History   Tobacco Use  . Smoking status: Never Smoker  . Smokeless tobacco: Never Used  Substance Use Topics  . Alcohol use: No  . Drug use: No    Home  Medications Prior to Admission medications   Medication Sig Start Date End Date Taking? Authorizing Provider  divalproex (DEPAKOTE SPRINKLE) 125 MG capsule Take 4 capsules (500 mg total) by mouth 4 (four) times daily. 08/02/19   Rory Percy, DO  feeding supplement, ENSURE ENLIVE, (ENSURE ENLIVE) LIQD Take 237 mLs by mouth 3 (three) times daily between meals. 08/02/19   Rory Percy, DO    Allergies    Patient has no known allergies.  Review of Systems   Review of Systems  Unable to perform ROS: Acuity of condition    Physical Exam Updated Vital Signs SpO2 (!) 61%   Physical Exam Vitals and nursing note reviewed.  Constitutional:      General: He is not in acute distress.    Appearance: He is cachectic. He is ill-appearing and toxic-appearing.  HENT:     Head: Normocephalic and atraumatic.  Eyes:     General: Lids are normal.     Conjunctiva/sclera: Conjunctivae normal.     Pupils: Pupils are equal, round, and reactive to light.  Neck:     Thyroid: No thyroid mass.     Trachea: No tracheal deviation.  Cardiovascular:     Rate and Rhythm: Normal rate and regular rhythm.     Heart sounds: Normal heart sounds. No murmur. No gallop.   Pulmonary:     Effort:  Pulmonary effort is normal. No respiratory distress.     Breath sounds: Normal breath sounds. No stridor. No decreased breath sounds, wheezing, rhonchi or rales.  Abdominal:     General: Bowel sounds are normal. There is no distension.     Palpations: Abdomen is soft.     Tenderness: There is no abdominal tenderness. There is no rebound.  Musculoskeletal:        General: No tenderness. Normal range of motion.     Cervical back: Normal range of motion and neck supple.  Skin:    General: Skin is warm and dry.     Findings: No abrasion or rash.  Neurological:     Mental Status: He is lethargic and disoriented.     GCS: GCS eye subscore is 2. GCS verbal subscore is 2. GCS motor subscore is 4.  Psychiatric:         Attention and Perception: He is inattentive.        Speech: Speech normal.        Behavior: Behavior normal.     ED Results / Procedures / Treatments   Labs (all labs ordered are listed, but only abnormal results are displayed) Labs Reviewed  POCT I-STAT EG7 - Abnormal; Notable for the following components:      Result Value   pCO2, Ven 37.8 (*)    pO2, Ven 241.0 (*)    Acid-base deficit 3.0 (*)    Sodium 175 (*)    Potassium 3.1 (*)    Calcium, Ion 0.96 (*)    HCT 37.0 (*)    Hemoglobin 12.6 (*)    All other components within normal limits  CULTURE, BLOOD (ROUTINE X 2)  CULTURE, BLOOD (ROUTINE X 2)  URINE CULTURE  LACTIC ACID, PLASMA  LACTIC ACID, PLASMA  COMPREHENSIVE METABOLIC PANEL  CBC WITH DIFFERENTIAL/PLATELET  APTT  PROTIME-INR  URINALYSIS, ROUTINE W REFLEX MICROSCOPIC  POC SARS CORONAVIRUS 2 AG -  ED    EKG None  Radiology No results found.  Procedures Procedures (including critical care time)  Medications Ordered in ED Medications  lactated ringers bolus 1,000 mL (has no administration in time range)    And  lactated ringers bolus 500 mL (has no administration in time range)    And  lactated ringers bolus 250 mL (has no administration in time range)    ED Course  I have reviewed the triage vital signs and the nursing notes.  Pertinent labs & imaging results that were available during my care of the patient were reviewed by me and considered in my medical decision making (see chart for details).    MDM Rules/Calculators/A&P                      Patient presented with hypotension as well as hypothermia.  IV access obtained by nursing.  Patient's labs significant for hyponatremia as well as acute kidney injury.  Given IV fluids consisting of lactated Ringer's as well as D5W.  Rapid Covid test negative.  Lactate elevated at 4.  Patient remained hypotensive despite IV fluid bolus of 30/kg.  Was started on norepinephrine drip as well as empiric  antibiotics.  Chest x-ray showed patient have pneumonia.  Discussion with patient's sister who requested patient be made a full code.  Patient be admitted to ICU  CRITICAL CARE Performed by: Leota Jacobsen Total critical care time: 75 minutes Critical care time was exclusive of separately billable procedures and treating other patients. Critical care  was necessary to treat or prevent imminent or life-threatening deterioration. Critical care was time spent personally by me on the following activities: development of treatment plan with patient and/or surrogate as well as nursing, discussions with consultants, evaluation of patient's response to treatment, examination of patient, obtaining history from patient or surrogate, ordering and performing treatments and interventions, ordering and review of laboratory studies, ordering and review of radiographic studies, pulse oximetry and re-evaluation of patient's condition.  Final Clinical Impression(s) / ED Diagnoses Final diagnoses:  None    Rx / DC Orders ED Discharge Orders    None       Lacretia Leigh, MD 10/14/19 2239

## 2019-10-14 NOTE — ED Notes (Signed)
[phone] Janett Billow pts sister please call and update when available

## 2019-10-14 NOTE — ED Notes (Signed)
CCM at bedside attempting CVC

## 2019-10-14 NOTE — Progress Notes (Signed)
Pharmacy Antibiotic Note  [name] is a 43 y.o. male admitted on 10/14/2019 with infection of unknown source.  Pharmacy has been consulted for vancomycin/cefepime dosing. AKI - SCr 5.99 on admit (Baseline 0.7-0.8 from 07/2019).  Plan: Cefepime 2g IV x 1 already infusing in the ER; then 1g IV q24h Vancomycin 1g IV x1; then f/u renal function trend and may consider ordering vancomycin level in 48 hours prior to redosing Flagyl 500mg  IV x 1 per EDP - f/u if to continue Monitor clinical progress, c/s, renal function F/u de-escalation plan/LOT, vancomycin levels as indicated      Temp (24hrs), Avg:90.5 F (32.5 C), Min:90.5 F (32.5 C), Max:90.5 F (32.5 C)  No results for input(s): WBC, CREATININE, LATICACIDVEN, VANCOTROUGH, VANCOPEAK, VANCORANDOM, GENTTROUGH, GENTPEAK, GENTRANDOM, TOBRATROUGH, TOBRAPEAK, TOBRARND, AMIKACINPEAK, AMIKACINTROU, AMIKACIN in the last 168 hours.  CrCl cannot be calculated (Patient's most recent lab result is older than the maximum 21 days allowed.).    No Known Allergies  Antimicrobials this admission: 12/19 vancomycin >>  12/19 cefepime >>  12/19 flagyl x 1  Dose adjustments this admission:   Microbiology results:   Elicia Lamp, PharmD, BCPS Clinical Pharmacist 10/14/2019 9:01 PM

## 2019-10-14 NOTE — ED Notes (Signed)
I Stat VBG result of Sodium of 175, and I Stat Chem 8 result of  Sodium of 176, K of 2.7, CL of >135 BUN >140 reported to Dr. Zenia Resides.

## 2019-10-15 ENCOUNTER — Inpatient Hospital Stay (HOSPITAL_COMMUNITY): Payer: Medicare Other

## 2019-10-15 DIAGNOSIS — J189 Pneumonia, unspecified organism: Secondary | ICD-10-CM

## 2019-10-15 DIAGNOSIS — Z452 Encounter for adjustment and management of vascular access device: Secondary | ICD-10-CM

## 2019-10-15 DIAGNOSIS — R579 Shock, unspecified: Secondary | ICD-10-CM

## 2019-10-15 DIAGNOSIS — J9601 Acute respiratory failure with hypoxia: Secondary | ICD-10-CM

## 2019-10-15 LAB — BASIC METABOLIC PANEL
BUN: 136 mg/dL — ABNORMAL HIGH (ref 6–20)
BUN: 116 mg/dL — ABNORMAL HIGH (ref 6–20)
BUN: 112 mg/dL — ABNORMAL HIGH (ref 6–20)
BUN: 106 mg/dL — ABNORMAL HIGH (ref 6–20)
BUN: 102 mg/dL — ABNORMAL HIGH (ref 6–20)
CO2: 26 mmol/L (ref 22–32)
CO2: 19 mmol/L — ABNORMAL LOW (ref 22–32)
CO2: 18 mmol/L — ABNORMAL LOW (ref 22–32)
CO2: 18 mmol/L — ABNORMAL LOW (ref 22–32)
CO2: 16 mmol/L — ABNORMAL LOW (ref 22–32)
Calcium: 6.8 mg/dL — ABNORMAL LOW (ref 8.9–10.3)
Calcium: 6.1 mg/dL — CL (ref 8.9–10.3)
Calcium: 6 mg/dL — CL (ref 8.9–10.3)
Calcium: 6.5 mg/dL — ABNORMAL LOW (ref 8.9–10.3)
Calcium: 7.9 mg/dL — ABNORMAL LOW (ref 8.9–10.3)
Chloride: >130 mmol/L (ref 98–111)
Chloride: >130 mmol/L (ref 98–111)
Chloride: >130 mmol/L (ref 98–111)
Chloride: >130 mmol/L (ref 98–111)
Chloride: >130 mmol/L (ref 98–111)
Creatinine, Ser: 4.76 mg/dL — ABNORMAL HIGH (ref 0.61–1.24)
Creatinine, Ser: 3.99 mg/dL — ABNORMAL HIGH (ref 0.61–1.24)
Creatinine, Ser: 3.65 mg/dL — ABNORMAL HIGH (ref 0.61–1.24)
Creatinine, Ser: 3.49 mg/dL — ABNORMAL HIGH (ref 0.61–1.24)
Creatinine, Ser: 3.18 mg/dL — ABNORMAL HIGH (ref 0.61–1.24)
GFR calc Af Amer: 16 mL/min — ABNORMAL LOW (ref >60)
GFR calc Af Amer: 20 mL/min — ABNORMAL LOW (ref >60)
GFR calc Af Amer: 22 mL/min — ABNORMAL LOW (ref >60)
GFR calc Af Amer: 24 mL/min — ABNORMAL LOW (ref >60)
GFR calc Af Amer: 26 mL/min — ABNORMAL LOW (ref >60)
GFR calc non Af Amer: 14 mL/min — ABNORMAL LOW (ref >60)
GFR calc non Af Amer: 17 mL/min — ABNORMAL LOW (ref >60)
GFR calc non Af Amer: 19 mL/min — ABNORMAL LOW (ref >60)
GFR calc non Af Amer: 20 mL/min — ABNORMAL LOW (ref >60)
GFR calc non Af Amer: 23 mL/min — ABNORMAL LOW (ref >60)
Glucose, Bld: 98 mg/dL (ref 70–99)
Glucose, Bld: 108 mg/dL — ABNORMAL HIGH (ref 70–99)
Glucose, Bld: 115 mg/dL — ABNORMAL HIGH (ref 70–99)
Glucose, Bld: 128 mg/dL — ABNORMAL HIGH (ref 70–99)
Glucose, Bld: 132 mg/dL — ABNORMAL HIGH (ref 70–99)
Potassium: 3.3 mmol/L — ABNORMAL LOW (ref 3.5–5.1)
Potassium: 2.9 mmol/L — ABNORMAL LOW (ref 3.5–5.1)
Potassium: 2.6 mmol/L — CL (ref 3.5–5.1)
Potassium: 2.9 mmol/L — ABNORMAL LOW (ref 3.5–5.1)
Potassium: 3 mmol/L — ABNORMAL LOW (ref 3.5–5.1)
Sodium: 171 mmol/L (ref 135–145)
Sodium: 165 mmol/L (ref 135–145)
Sodium: 163 mmol/L (ref 135–145)
Sodium: 162 mmol/L (ref 135–145)
Sodium: 163 mmol/L (ref 135–145)

## 2019-10-15 LAB — POCT I-STAT 7, (LYTES, BLD GAS, ICA,H+H)
Acid-base deficit: 5 mmol/L — ABNORMAL HIGH (ref 0.0–2.0)
Acid-base deficit: 2 mmol/L (ref 0.0–2.0)
Bicarbonate: 24.7 mmol/L (ref 20.0–28.0)
Bicarbonate: 21.9 mmol/L (ref 20.0–28.0)
Calcium, Ion: 0.98 mmol/L — ABNORMAL LOW (ref 1.15–1.40)
Calcium, Ion: 0.91 mmol/L — ABNORMAL LOW (ref 1.15–1.40)
HCT: 29 % — ABNORMAL LOW (ref 39.0–52.0)
HCT: 28 % — ABNORMAL LOW (ref 39.0–52.0)
Hemoglobin: 9.9 g/dL — ABNORMAL LOW (ref 13.0–17.0)
Hemoglobin: 9.5 g/dL — ABNORMAL LOW (ref 13.0–17.0)
O2 Saturation: 98 %
O2 Saturation: 95 %
Patient temperature: 95.9
Patient temperature: 36.1
Potassium: 3.1 mmol/L — ABNORMAL LOW (ref 3.5–5.1)
Potassium: 3 mmol/L — ABNORMAL LOW (ref 3.5–5.1)
Sodium: 170 mmol/L (ref 135–145)
Sodium: 169 mmol/L (ref 135–145)
TCO2: 27 mmol/L (ref 22–32)
TCO2: 23 mmol/L (ref 22–32)
pCO2 arterial: 68.2 mmHg (ref 32.0–48.0)
pCO2 arterial: 29.8 mmHg — ABNORMAL LOW (ref 32.0–48.0)
pH, Arterial: 7.158 — CL (ref 7.350–7.450)
pH, Arterial: 7.47 — ABNORMAL HIGH (ref 7.350–7.450)
pO2, Arterial: 122 mmHg — ABNORMAL HIGH (ref 83.0–108.0)
pO2, Arterial: 67 mmHg — ABNORMAL LOW (ref 83.0–108.0)

## 2019-10-15 LAB — URINALYSIS, ROUTINE W REFLEX MICROSCOPIC
Bilirubin Urine: NEGATIVE
Glucose, UA: 50 mg/dL — AB
Ketones, ur: 5 mg/dL — AB
Nitrite: NEGATIVE
Protein, ur: 100 mg/dL — AB
Specific Gravity, Urine: 1.02 (ref 1.005–1.030)
WBC, UA: >50 WBC/hpf — ABNORMAL HIGH (ref 0–5)
pH: 5 (ref 5.0–8.0)

## 2019-10-15 LAB — POCT I-STAT, CHEM 8
BUN: 106 mg/dL — ABNORMAL HIGH (ref 6–20)
Calcium, Ion: 0.89 mmol/L — CL (ref 1.15–1.40)
Chloride: 130 mmol/L — ABNORMAL HIGH (ref 98–111)
Creatinine, Ser: 3.5 mg/dL — ABNORMAL HIGH (ref 0.61–1.24)
Glucose, Bld: 93 mg/dL (ref 70–99)
HCT: 29 % — ABNORMAL LOW (ref 39.0–52.0)
Hemoglobin: 9.9 g/dL — ABNORMAL LOW (ref 13.0–17.0)
Potassium: 2.6 mmol/L — CL (ref 3.5–5.1)
Sodium: 167 mmol/L (ref 135–145)
TCO2: 18 mmol/L — ABNORMAL LOW (ref 22–32)

## 2019-10-15 LAB — CBC
HCT: 38.3 % — ABNORMAL LOW (ref 39.0–52.0)
HCT: 33.3 % — ABNORMAL LOW (ref 39.0–52.0)
Hemoglobin: 11.1 g/dL — ABNORMAL LOW (ref 13.0–17.0)
Hemoglobin: 9.7 g/dL — ABNORMAL LOW (ref 13.0–17.0)
MCH: 31.8 pg (ref 26.0–34.0)
MCH: 31.2 pg (ref 26.0–34.0)
MCHC: 29 g/dL — ABNORMAL LOW (ref 30.0–36.0)
MCHC: 29.1 g/dL — ABNORMAL LOW (ref 30.0–36.0)
MCV: 109.7 fL — ABNORMAL HIGH (ref 80.0–100.0)
MCV: 107.1 fL — ABNORMAL HIGH (ref 80.0–100.0)
Platelets: 35 10*3/uL — ABNORMAL LOW (ref 150–400)
Platelets: 37 10*3/uL — ABNORMAL LOW (ref 150–400)
RBC: 3.49 MIL/uL — ABNORMAL LOW (ref 4.22–5.81)
RBC: 3.11 MIL/uL — ABNORMAL LOW (ref 4.22–5.81)
RDW: 19.1 % — ABNORMAL HIGH (ref 11.5–15.5)
RDW: 18.7 % — ABNORMAL HIGH (ref 11.5–15.5)
WBC: 1.4 10*3/uL — CL (ref 4.0–10.5)
WBC: 1.3 10*3/uL — CL (ref 4.0–10.5)
nRBC: 9.5 % — ABNORMAL HIGH (ref 0.0–0.2)
nRBC: 16.7 % — ABNORMAL HIGH (ref 0.0–0.2)

## 2019-10-15 LAB — GLUCOSE, CAPILLARY
Glucose-Capillary: 88 mg/dL (ref 70–99)
Glucose-Capillary: 119 mg/dL — ABNORMAL HIGH (ref 70–99)
Glucose-Capillary: 113 mg/dL — ABNORMAL HIGH (ref 70–99)

## 2019-10-15 LAB — PHOSPHORUS: Phosphorus: 3.2 mg/dL (ref 2.5–4.6)

## 2019-10-15 LAB — SARS CORONAVIRUS 2 (TAT 6-24 HRS): SARS Coronavirus 2: NEGATIVE

## 2019-10-15 LAB — LACTIC ACID, PLASMA: Lactic Acid, Venous: 2 mmol/L (ref 0.5–1.9)

## 2019-10-15 LAB — MRSA PCR SCREENING: MRSA by PCR: NEGATIVE

## 2019-10-15 LAB — VANCOMYCIN, RANDOM: Vancomycin Rm: 14

## 2019-10-15 LAB — STREP PNEUMONIAE URINARY ANTIGEN: Strep Pneumo Urinary Antigen: NEGATIVE

## 2019-10-15 MED ORDER — MAGNESIUM SULFATE 2 GM/50ML IV SOLN
2.0000 g | Freq: Once | INTRAVENOUS | Status: AC
  Administered 2019-10-15: 2 g via INTRAVENOUS
  Filled 2019-10-15: qty 50

## 2019-10-15 MED ORDER — POTASSIUM CHLORIDE 10 MEQ/100ML IV SOLN
10.0000 meq | INTRAVENOUS | Status: AC
  Administered 2019-10-15 (×4): 10 meq via INTRAVENOUS
  Filled 2019-10-15 (×4): qty 100

## 2019-10-15 MED ORDER — SODIUM CHLORIDE 0.9 % IV SOLN: 250.0000 mL | INTRAVENOUS | Status: DC

## 2019-10-15 MED ORDER — SODIUM CHLORIDE 0.9 % IV BOLUS
1000.0000 mL | Freq: Once | INTRAVENOUS | Status: AC
  Administered 2019-10-15: 1000 mL via INTRAVENOUS

## 2019-10-15 MED ORDER — SODIUM CHLORIDE 0.9 % IV SOLN
1.0000 g | Freq: Once | INTRAVENOUS | Status: AC
  Administered 2019-10-15: 1 g via INTRAVENOUS
  Filled 2019-10-15: qty 10

## 2019-10-15 MED ORDER — DEXTROSE-NACL 5-0.2 % IV SOLN: INTRAVENOUS | Status: DC

## 2019-10-15 MED ORDER — CALCIUM GLUCONATE-NACL 1-0.675 GM/50ML-% IV SOLN
1.0000 g | Freq: Once | INTRAVENOUS | Status: AC
  Administered 2019-10-15: 1000 mg via INTRAVENOUS
  Filled 2019-10-15: qty 50

## 2019-10-15 MED ORDER — PHENYLEPHRINE HCL-NACL 10-0.9 MG/250ML-% IV SOLN
INTRAVENOUS | Status: AC
  Filled 2019-10-15: qty 250

## 2019-10-15 MED ORDER — EPINEPHRINE HCL 5 MG/250ML IV SOLN IN NS
0.5000 ug/min | INTRAVENOUS | Status: DC
  Administered 2019-10-15: 0.5 ug/min via INTRAVENOUS
  Filled 2019-10-15: qty 250

## 2019-10-15 MED ORDER — POTASSIUM CHLORIDE 10 MEQ/50ML IV SOLN
10.0000 meq | INTRAVENOUS | Status: AC
  Administered 2019-10-15 (×6): 10 meq via INTRAVENOUS
  Filled 2019-10-15: qty 50

## 2019-10-15 MED ORDER — HEPARIN SODIUM (PORCINE) 5000 UNIT/ML IJ SOLN
5000.0000 [IU] | Freq: Three times a day (TID) | INTRAMUSCULAR | Status: DC
  Administered 2019-10-15 – 2019-10-19 (×12): 5000 [IU] via SUBCUTANEOUS
  Filled 2019-10-15 (×12): qty 1

## 2019-10-15 MED ORDER — LACTATED RINGERS IV BOLUS
2000.0000 mL | Freq: Once | INTRAVENOUS | Status: AC
  Administered 2019-10-15: 2000 mL via INTRAVENOUS

## 2019-10-15 MED ORDER — ORAL CARE MOUTH RINSE
15.0000 mL | OROMUCOSAL | Status: DC
  Administered 2019-10-15 – 2019-10-22 (×70): 15 mL via OROMUCOSAL

## 2019-10-15 MED ORDER — ROCURONIUM BROMIDE 50 MG/5ML IV SOLN
50.0000 mg | Freq: Once | INTRAVENOUS | Status: AC
  Administered 2019-10-15: 50 mg via INTRAVENOUS

## 2019-10-15 MED ORDER — PANTOPRAZOLE SODIUM 40 MG IV SOLR
40.0000 mg | Freq: Every day | INTRAVENOUS | Status: DC
  Administered 2019-10-15 – 2019-10-18 (×4): 40 mg via INTRAVENOUS
  Filled 2019-10-15 (×4): qty 40

## 2019-10-15 MED ORDER — ETOMIDATE 2 MG/ML IV SOLN
10.0000 mg | Freq: Once | INTRAVENOUS | Status: AC
  Administered 2019-10-15: 10 mg via INTRAVENOUS

## 2019-10-15 MED ORDER — CHLORHEXIDINE GLUCONATE CLOTH 2 % EX PADS
6.0000 | MEDICATED_PAD | Freq: Every day | CUTANEOUS | Status: DC
  Administered 2019-10-15 – 2019-10-16 (×2): 6 via TOPICAL

## 2019-10-15 MED ORDER — PHENYLEPHRINE CONCENTRATED 100MG/250ML (0.4 MG/ML) INFUSION SIMPLE
0.0000 ug/min | INTRAVENOUS | Status: DC
  Administered 2019-10-15: 380 ug/min via INTRAVENOUS
  Administered 2019-10-15: 400 ug/min via INTRAVENOUS
  Administered 2019-10-15: 280 ug/min via INTRAVENOUS
  Administered 2019-10-15: 300 ug/min via INTRAVENOUS
  Administered 2019-10-16: 110 ug/min via INTRAVENOUS
  Filled 2019-10-15 (×6): qty 250

## 2019-10-15 MED ORDER — VALPROATE SODIUM 500 MG/5ML IV SOLN
500.0000 mg | Freq: Four times a day (QID) | INTRAVENOUS | Status: DC
  Administered 2019-10-15 – 2019-10-19 (×16): 500 mg via INTRAVENOUS
  Filled 2019-10-15 (×18): qty 5

## 2019-10-15 MED ORDER — SODIUM BICARBONATE 8.4 % IV SOLN
100.0000 meq | Freq: Once | INTRAVENOUS | Status: AC
  Administered 2019-10-15: 100 meq via INTRAVENOUS

## 2019-10-15 MED ORDER — SODIUM BICARBONATE 8.4 % IV SOLN
INTRAVENOUS | Status: AC
  Filled 2019-10-15: qty 100

## 2019-10-15 MED ORDER — NOREPINEPHRINE 16 MG/250ML-% IV SOLN
0.0000 ug/min | INTRAVENOUS | Status: DC
  Administered 2019-10-15: 70 ug/min via INTRAVENOUS
  Administered 2019-10-15: 60 ug/min via INTRAVENOUS
  Administered 2019-10-15: 70 ug/min via INTRAVENOUS
  Administered 2019-10-15: 35 ug/min via INTRAVENOUS
  Administered 2019-10-16: 45 ug/min via INTRAVENOUS
  Administered 2019-10-16: 35 ug/min via INTRAVENOUS
  Administered 2019-10-16: 45 ug/min via INTRAVENOUS
  Administered 2019-10-17: 20 ug/min via INTRAVENOUS
  Administered 2019-10-18: 02:00:00 14 ug/min via INTRAVENOUS
  Filled 2019-10-15 (×8): qty 250

## 2019-10-15 MED ORDER — VANCOMYCIN HCL 750 MG/150ML IV SOLN
750.0000 mg | Freq: Once | INTRAVENOUS | Status: AC
  Administered 2019-10-15: 750 mg via INTRAVENOUS
  Filled 2019-10-15: qty 150

## 2019-10-15 MED ORDER — CHLORHEXIDINE GLUCONATE 0.12% ORAL RINSE (MEDLINE KIT)
15.0000 mL | Freq: Two times a day (BID) | OROMUCOSAL | Status: DC
  Administered 2019-10-15 – 2019-11-06 (×43): 15 mL via OROMUCOSAL

## 2019-10-15 MED ORDER — PHENYLEPHRINE HCL-NACL 10-0.9 MG/250ML-% IV SOLN
25.0000 ug/min | INTRAVENOUS | Status: DC
  Administered 2019-10-15: 25 ug/min via INTRAVENOUS
  Filled 2019-10-15: qty 250
  Filled 2019-10-15: qty 500

## 2019-10-15 MED ORDER — HYDROCORTISONE NA SUCCINATE PF 100 MG IJ SOLR
50.0000 mg | Freq: Four times a day (QID) | INTRAMUSCULAR | Status: DC
  Administered 2019-10-15 – 2019-10-21 (×25): 50 mg via INTRAVENOUS
  Filled 2019-10-15 (×25): qty 2

## 2019-10-15 NOTE — Progress Notes (Signed)
[facility] Progress Note Patient Name: [name] DOB: 01/14/1976 MRN: [id number]   Date of Service  10/15/2019  HPI/Events of Note  Multiple issues: 1. Hypotension - BP = 83/30 with MAP = 39. CVP = 0.1 and 2. Na+ = 171.   eICU Interventions  Will order: 1. 0.9 NaCl 1 liter IV over 1 hour now.  2. D5 0.2 NaCl to run IV at 125 mL/hour. 3. ABG STAT.     Intervention Category Major Interventions: Hypotension - evaluation and management  Gina Leblond Eugene 10/15/2019, 5:05 AM

## 2019-10-15 NOTE — Procedures (Signed)
Central Venous Catheter Insertion Procedure Note SHLOIME CREESE [id number] Feb 05, 1976  Procedure: Insertion of Central Venous Catheter Indications: Drug and/or fluid administration  Procedure Details Consent: Unable to obtain consent because of emergent medical necessity. Time Out: Verified patient identification, verified procedure, site/side was marked, verified correct patient position, special equipment/implants available, medications/allergies/relevent history reviewed, required imaging and test results available.  Performed  Maximum sterile technique was used including antiseptics, cap, gloves, gown, hand hygiene, mask and sheet. Skin prep: Chlorhexidine; local anesthetic administered A antimicrobial bonded/coated triple lumen catheter was placed in the left subclavian vein using the Seldinger technique and ultrasound guidance.  Evaluation Blood flow good Complications: No apparent complications Patient did tolerate procedure well. Chest X-ray ordered to verify placement.  CXR: normal.  Vonzell Lindblad T Bernette Seeman 10/15/2019, 3:48 AM

## 2019-10-15 NOTE — Procedures (Signed)
Intubation Procedure Note [name] [id number] 1976-06-09  Procedure: Intubation Indications: Respiratory insufficiency  Procedure Details Consent: Unable to obtain consent because of altered level of consciousness. Time Out: Verified patient identification, verified procedure, site/side was marked, verified correct patient position, special equipment/implants available, medications/allergies/relevent history reviewed, required imaging and test results available.  Performed  Maximum sterile technique was used including gloves and mask.  MAC and 3, glideoscope.  Grade 2 view.      Evaluation Hemodynamic Status: BP stable throughout; O2 sats: unable to capture Patient's Current Condition: unstable Complications: No apparent complications Patient did tolerate procedure well. Chest X-ray ordered to verify placement.  CXR: pending.   [name] 10/15/2019

## 2019-10-15 NOTE — Progress Notes (Signed)
Received call from lab, pts ca: 6.1. Relayed to CCM MD. Received verbal orders to get ionized calcium and if less than 1, give 1g calcium gluconate.

## 2019-10-15 NOTE — Progress Notes (Addendum)
NAME:  [name], MRN:  [id number], DOB:  1976/01/10, LOS: 1 ADMISSION DATE:  10/14/2019,  CHIEF COMPLAINT: Altered mental status  Brief History   Patient is a 43 year old gentleman with a childhood brain tumor and developmental delay as well as seizures disorder who lives in a nursing home.  He presents with several days of decreased oral intake dehydration and decreased mentation and mental status from baseline.  Was admitted early the morning of 12/29 found to be in septic shock, severe dehydration with severe hypernatremia, with suspected source to be UTI.  He was intubated for worsening respiratory acidosis and clinical coma.   Consults:  None  Procedures:  12/20 intubation 12/20 central line 12/20 COVID-19 test negative, this was PCRx2 12/20 influenza negative Significant Diagnostic Tests:    Micro Data:  UA on 12/20 consistent with UTI 12/20 cultures pending for urine and blood  Antimicrobials:  12/20 cefepime>> 12/20 Flagyl x1 12/20 vancomycin>>  Interim history/subjective:  Early this morning was intubated.  Has worsening septic shock now on 3 vasoactive agents and started stress dose steroids.  He is not responsive  Objective   Blood pressure (!) 83/68, pulse 62, temperature 99.5 F (37.5 C), resp. rate 16, height 5\' 11"  (1.803 m), weight 52.6 kg, SpO2 99 %. CVP:  [0 mmHg-12 mmHg] 7 mmHg  Vent Mode: PRVC FiO2 (%):  [60 %-100 %] 60 % Set Rate:  [16 bmp] 16 bmp Vt Set:  [600 mL] 600 mL PEEP:  [5 cmH20] 5 cmH20 Plateau Pressure:  [21 cmH20] 21 cmH20   Intake/Output Summary (Last 24 hours) at 10/15/2019 1024 Last data filed at 10/15/2019 0800 Gross per 24 hour  Intake 5667.59 ml  Output 220 ml  Net 5447.59 ml   Filed Weights   10/14/19 2104 10/15/19 0440  Weight: 50 kg 52.6 kg    Examination: General:  HENT: Dry mucous membranes, ET tube in place Lungs: Clear to auscultation bilaterally, mechanical ventilation sounds heard, no wheezes or  crackles Cardiovascular: Regular rate and rhythm, heart rate in 60-70s despite 3 vasoactive agents Abdomen: Scaphoid, soft Extremities: Thin no edema Neuro: Not responsive MSK: Contractured extremities, decreased muscle bulk and tone, deep tissue injury present on admission Lines: Left subclavian CVC, arterial line   Assessment & Plan:  Patient is a 43 year old gentleman with a history of developmental delay who is bedbound in a nursing home   Acute encephalopathy Septic shock secondary to likely UTI Acute kidney injury, likely prerenal with component of intrinsic from UTI Uremic encephalopathy Multiple electrolyte abnormalities including: Hypocalcemia hypokalemia hypernatremia hyperchloremia Deep tissue injury present on admission Severe protein calorie malnutrition Acute hypoxemic respiratory failure, intubated secondary, to clinical coma  Plan is to follow-up blood and urine cultures, continue vancomycin and cefepime for now empirically. About a stress dose steroids as he is about to go on a third vasoactive agent.  He has received 8 L of isotonic fluid and is not responding.  His sodium however is trending in a good direction we will continue fluid resuscitation for electrolyte replacement.  Goal MAP 65.  Full vent support.  Every 4 BMPs to monitor electrolytes.  He is very critically ill and unfortunately does not have a good baseline protoplasm given his debility.  I am very concerned about his ability to thrive this hospitalization.  Best practice:  Diet: N.p.o., too ill for tube feed at this time Pain/Anxiety/Delirium protocol (if indicated): We will monitor, currently not on any sedation, limit benzos VAP protocol (if indicated):  Ordered DVT prophylaxis: Subcu heparin GI prophylaxis: On PPI Glucose control: We will monitor currently under 180 Foley yes needed for critical illness Mobility: Bedrest Code Status: Full code, discussed with sister Family Communication: Sister  updated early this morning after intubation Disposition: Needs ICU   Labs and Imaging   Chest xray 12/20 reviewed - he has right sided perihilar opacity possible pna. Scoliosis  CBC: Recent Labs  Lab 10/14/19 2000 10/14/19 2011 10/14/19 2200 10/15/19 0331 10/15/19 0641  WBC 2.3*  --  1.4* 1.3*  --   NEUTROABS 1.2*  --   --   --   --   HGB 12.7* 12.6* 11.1* 9.7* 9.5*  HCT 44.6 37.0* 38.3* 33.3* 28.0*  MCV 111.5*  --  109.7* 107.1*  --   PLT 53*  --  35* 37*  --     Basic Metabolic Panel: Recent Labs  Lab 10/14/19 2000 10/14/19 2011 10/14/19 2200 10/15/19 0331 10/15/19 0641 10/15/19 0804  NA 175* 175*  --  171* 169* 165*  K 2.8* 3.1*  --  3.3* 3.0* 2.9*  CL >130*  --   --  >130*  --  >130*  CO2 18*  --   --  26  --  19*  GLUCOSE 159*  --   --  98  --  108*  BUN 166*  --   --  136*  --  116*  CREATININE 5.99*  --  5.30* 4.76*  --  3.99*  CALCIUM 8.0*  --   --  6.8*  --  6.1*   GFR: Estimated Creatinine Clearance: 17.9 mL/min (A) (by C-G formula based on SCr of 3.99 mg/dL (H)). Recent Labs  Lab 10/14/19 2000 10/14/19 2030 10/14/19 2200 10/15/19 0331  WBC 2.3*  --  1.4* 1.3*  LATICACIDVEN  --  4.1* 4.3* 2.0*    Liver Function Tests: Recent Labs  Lab 10/14/19 2000  AST 54*  ALT 18  ALKPHOS 43  BILITOT 1.3*  PROT 6.9  ALBUMIN 2.7*   No results for input(s): LIPASE, AMYLASE in the last 168 hours. No results for input(s): AMMONIA in the last 168 hours.  ABG    Component Value Date/Time   PHART 7.470 (H) 10/15/2019 0641   PCO2ART 29.8 (L) 10/15/2019 0641   PO2ART 67.0 (L) 10/15/2019 0641   HCO3 21.9 10/15/2019 0641   TCO2 23 10/15/2019 0641   ACIDBASEDEF 2.0 10/15/2019 0641   O2SAT 95.0 10/15/2019 0641     Coagulation Profile: Recent Labs  Lab 10/14/19 2030  INR 1.2    Cardiac Enzymes: No results for input(s): CKTOTAL, CKMB, CKMBINDEX, TROPONINI in the last 168 hours.  HbA1C: No results found for: HGBA1C  CBG: Recent Labs  Lab  10/15/19 0211  GLUCAP 88    Critical care time:   The patient is critically ill with multiple organ systems failure and requires high complexity decision making for assessment and support, frequent evaluation and titration of therapies, application of advanced monitoring technologies and extensive interpretation of multiple databases.   Critical Care Time devoted to patient care services described in this note is 54 minutes. This time reflects the time of my personal involvement. This critical care time does not reflect separately billable procedures or procedure time, teaching time or supervisory time of PA/NP/Med student/Med Resident etc but could involve care discussion time.  [facility] 10/15/2019 10:24 AM  Pager: [phone] After hours pager: [phone]

## 2019-10-15 NOTE — Progress Notes (Signed)
[facility] Progress Note Patient Name: [name] DOB: April 11, 1976 MRN: [id number]   Date of Service  10/15/2019  HPI/Events of Note  ABG on 100% NRBM = 7.138/72.8/131/24.7 c/w hypercarbic respiratory failure.   eICU Interventions  Plan: 1. Will notify ground team that he needs intubation. 2. NaHCO3 100 meq IV now.      Intervention Category Major Interventions: Acid-Base disturbance - evaluation and management;Respiratory failure - evaluation and management  [name] Eugene 10/15/2019, 5:25 AM

## 2019-10-15 NOTE — Progress Notes (Signed)
CRITICAL VALUE ALERT  Critical Value:  Sodium 171, Cl >130  Date & Time Notied:  10/15/19 4:32 AM   Provider Notified: Oletta Darter, MD  Orders Received/Actions taken: awaiting orders

## 2019-10-15 NOTE — Procedures (Signed)
Arterial Catheter Insertion Procedure Note [name] [name] 1976/07/11  Procedure: Insertion of Arterial Catheter  Indications: Blood pressure monitoring  Procedure Details Consent: Unable to obtain consent because of emergent medical necessity. Time Out: Verified patient identification, verified procedure, site/side was marked, verified correct patient position, special equipment/implants available, medications/allergies/relevent history reviewed, required imaging and test results available.  Performed  Maximum sterile technique was used including antiseptics, cap, gloves, gown, hand hygiene, mask and sheet. Skin prep: Chlorhexidine; local anesthetic administered 20 gauge catheter was inserted into left radial artery using the Seldinger technique. ULTRASOUND GUIDANCE USED: YES Evaluation Blood flow good; BP tracing good. Complications: No apparent complications.   [name] [name] 10/15/2019

## 2019-10-15 NOTE — Progress Notes (Signed)
[facility] Progress Note Patient Name: [name] DOB: [birth year] MRN: [id number]   Date of Service  10/15/2019  HPI/Events of Note  Hypotension - BP = 77/54 with MAP = 51. Patient is on Norepinephrine IV infusion at ceiling for Periferal IV.   eICU Interventions  Will order: 1. Phenylephrine IV infusion via Periferal IV. 2. Bolus with 0.9 NaCl 1 liter IV over 1 hour now. 3. Will notify ground team of need for CVL.     Intervention Category Major Interventions: Hypotension - evaluation and management  [name] 10/15/2019, 2:21 AM

## 2019-10-15 NOTE — Progress Notes (Signed)
Pharmacy Antibiotic Note  [name] is a 43 y.o. male admitted on 10/14/2019 with infection of unknown source.  Pharmacy has been consulted for vancomycin/cefepime dosing. AKI noted on admit now improving and increasing UOP throughout today after IVF. Vancomycin random ~24 hr after last dose is 14 mcg/ml.  Plan: -Vancomycin 750mg  IV x1 -Watch Cr trend closely, if improves again tomorrow could consider scheduling vanco instead of pulse dosing   Height: 5\' 11"  (180.3 cm) Weight: 115 lb 15.4 oz (52.6 kg) IBW/kg (Calculated) : 75.3  Temp (24hrs), Avg:97.8 F (36.6 C), Min:90.5 F (32.5 C), Max:99.9 F (37.7 C)  Recent Labs  Lab 10/14/19 2000 10/14/19 2030 10/14/19 2200 10/15/19 0331 10/15/19 0804 10/15/19 1037 10/15/19 1138 10/15/19 1554 10/15/19 1928  WBC 2.3*  --  1.4* 1.3*  --   --   --   --   --   CREATININE 5.99*  --  5.30* 4.76* 3.99* 3.50* 3.65* 3.49* 3.18*  LATICACIDVEN  --  4.1* 4.3* 2.0*  --   --   --   --   --   VANCORANDOM  --   --   --   --   --   --   --   --  14    Estimated Creatinine Clearance: 22.5 mL/min (A) (by C-G formula based on SCr of 3.18 mg/dL (H)).    No Known Allergies  Antimicrobials this admission: 12/19 vancomycin >>  12/19 cefepime >>  12/19 flagyl x 1  Dose adjustments this admission:   Microbiology results:   Arrie Senate, PharmD, BCPS Clinical Pharmacist [phone] Please check AMION for all [place] numbers 10/15/2019

## 2019-10-15 NOTE — Progress Notes (Signed)
Pts QTc is 0.60.  Spoke w/ pharmacy, no scheduled meds known to prolong QTc on pts med list. Replacing electrolytes.  All relayed to CCM MD.

## 2019-10-16 DIAGNOSIS — B9620 Unspecified Escherichia coli [E. coli] as the cause of diseases classified elsewhere: Secondary | ICD-10-CM

## 2019-10-16 DIAGNOSIS — A419 Sepsis, unspecified organism: Secondary | ICD-10-CM

## 2019-10-16 DIAGNOSIS — R6521 Severe sepsis with septic shock: Secondary | ICD-10-CM

## 2019-10-16 DIAGNOSIS — E43 Unspecified severe protein-calorie malnutrition: Secondary | ICD-10-CM | POA: Insufficient documentation

## 2019-10-16 DIAGNOSIS — J9600 Acute respiratory failure, unspecified whether with hypoxia or hypercapnia: Secondary | ICD-10-CM

## 2019-10-16 DIAGNOSIS — N390 Urinary tract infection, site not specified: Secondary | ICD-10-CM

## 2019-10-16 LAB — CBC
HCT: 37.5 % — ABNORMAL LOW (ref 39.0–52.0)
Hemoglobin: 11.8 g/dL — ABNORMAL LOW (ref 13.0–17.0)
MCH: 31.5 pg (ref 26.0–34.0)
MCHC: 31.5 g/dL (ref 30.0–36.0)
MCV: 100 fL (ref 80.0–100.0)
Platelets: 47 10*3/uL — ABNORMAL LOW (ref 150–400)
RBC: 3.75 MIL/uL — ABNORMAL LOW (ref 4.22–5.81)
RDW: 18.3 % — ABNORMAL HIGH (ref 11.5–15.5)
WBC: 5.8 10*3/uL (ref 4.0–10.5)
nRBC: 3.3 % — ABNORMAL HIGH (ref 0.0–0.2)

## 2019-10-16 LAB — BASIC METABOLIC PANEL
BUN: 94 mg/dL — ABNORMAL HIGH (ref 6–20)
BUN: 90 mg/dL — ABNORMAL HIGH (ref 6–20)
BUN: 87 mg/dL — ABNORMAL HIGH (ref 6–20)
CO2: 17 mmol/L — ABNORMAL LOW (ref 22–32)
CO2: 16 mmol/L — ABNORMAL LOW (ref 22–32)
CO2: 17 mmol/L — ABNORMAL LOW (ref 22–32)
Calcium: 7.6 mg/dL — ABNORMAL LOW (ref 8.9–10.3)
Calcium: 7.6 mg/dL — ABNORMAL LOW (ref 8.9–10.3)
Calcium: 7.4 mg/dL — ABNORMAL LOW (ref 8.9–10.3)
Chloride: >130 mmol/L (ref 98–111)
Chloride: >130 mmol/L (ref 98–111)
Chloride: >130 mmol/L (ref 98–111)
Creatinine, Ser: 2.77 mg/dL — ABNORMAL HIGH (ref 0.61–1.24)
Creatinine, Ser: 2.55 mg/dL — ABNORMAL HIGH (ref 0.61–1.24)
Creatinine, Ser: 2.57 mg/dL — ABNORMAL HIGH (ref 0.61–1.24)
GFR calc Af Amer: 31 mL/min — ABNORMAL LOW (ref >60)
GFR calc Af Amer: 35 mL/min — ABNORMAL LOW (ref >60)
GFR calc Af Amer: 34 mL/min — ABNORMAL LOW (ref >60)
GFR calc non Af Amer: 27 mL/min — ABNORMAL LOW (ref >60)
GFR calc non Af Amer: 30 mL/min — ABNORMAL LOW (ref >60)
GFR calc non Af Amer: 30 mL/min — ABNORMAL LOW (ref >60)
Glucose, Bld: 136 mg/dL — ABNORMAL HIGH (ref 70–99)
Glucose, Bld: 143 mg/dL — ABNORMAL HIGH (ref 70–99)
Glucose, Bld: 246 mg/dL — ABNORMAL HIGH (ref 70–99)
Potassium: 3 mmol/L — ABNORMAL LOW (ref 3.5–5.1)
Potassium: 4.3 mmol/L (ref 3.5–5.1)
Potassium: 3.2 mmol/L — ABNORMAL LOW (ref 3.5–5.1)
Sodium: 165 mmol/L (ref 135–145)
Sodium: 164 mmol/L (ref 135–145)
Sodium: 161 mmol/L (ref 135–145)

## 2019-10-16 LAB — GLUCOSE, CAPILLARY
Glucose-Capillary: 104 mg/dL — ABNORMAL HIGH (ref 70–99)
Glucose-Capillary: 117 mg/dL — ABNORMAL HIGH (ref 70–99)
Glucose-Capillary: 127 mg/dL — ABNORMAL HIGH (ref 70–99)
Glucose-Capillary: 168 mg/dL — ABNORMAL HIGH (ref 70–99)
Glucose-Capillary: 195 mg/dL — ABNORMAL HIGH (ref 70–99)
Glucose-Capillary: 200 mg/dL — ABNORMAL HIGH (ref 70–99)

## 2019-10-16 LAB — PHOSPHORUS
Phosphorus: 3.7 mg/dL (ref 2.5–4.6)
Phosphorus: 2.4 mg/dL — ABNORMAL LOW (ref 2.5–4.6)

## 2019-10-16 LAB — MAGNESIUM
Magnesium: 2.7 mg/dL — ABNORMAL HIGH (ref 1.7–2.4)
Magnesium: 2.5 mg/dL — ABNORMAL HIGH (ref 1.7–2.4)

## 2019-10-16 LAB — PATHOLOGIST SMEAR REVIEW: Path Review: INCREASED

## 2019-10-16 MED ORDER — PHENYLEPHRINE HCL-NACL 10-0.9 MG/250ML-% IV SOLN: 0.0000 ug/min | INTRAVENOUS | Status: DC

## 2019-10-16 MED ORDER — LACTATED RINGERS IV SOLN: INTRAVENOUS | Status: DC

## 2019-10-16 MED ORDER — VITAL HIGH PROTEIN PO LIQD
1000.0000 mL | ORAL | Status: DC
  Administered 2019-10-16: 1000 mL

## 2019-10-16 MED ORDER — POTASSIUM CHLORIDE 20 MEQ/15ML (10%) PO SOLN
40.0000 meq | ORAL | Status: AC
  Administered 2019-10-16 (×2): 40 meq via ORAL
  Filled 2019-10-16 (×2): qty 30

## 2019-10-16 MED ORDER — POTASSIUM CHLORIDE 10 MEQ/100ML IV SOLN
10.0000 meq | INTRAVENOUS | Status: DC
  Administered 2019-10-16: 10 meq via INTRAVENOUS
  Filled 2019-10-16: qty 100

## 2019-10-16 MED ORDER — FREE WATER
300.0000 mL | Status: DC
  Administered 2019-10-16 – 2019-10-19 (×17): 300 mL

## 2019-10-16 MED ORDER — FREE WATER
200.0000 mL | Freq: Three times a day (TID) | Status: DC
  Administered 2019-10-16: 200 mL

## 2019-10-16 MED ORDER — DEXTROSE-NACL 5-0.45 % IV SOLN: INTRAVENOUS | Status: DC

## 2019-10-16 MED ORDER — ATROPINE SULFATE 1 MG/10ML IJ SOSY
PREFILLED_SYRINGE | INTRAMUSCULAR | Status: AC
  Filled 2019-10-16: qty 10

## 2019-10-16 MED ORDER — PRO-STAT SUGAR FREE PO LIQD
30.0000 mL | Freq: Two times a day (BID) | ORAL | Status: DC
  Administered 2019-10-16: 30 mL
  Filled 2019-10-16: qty 30

## 2019-10-16 MED ORDER — VITAL AF 1.2 CAL PO LIQD
1000.0000 mL | ORAL | Status: DC
  Administered 2019-10-16 – 2019-10-21 (×6): 1000 mL
  Filled 2019-10-16: qty 1000

## 2019-10-16 MED ORDER — INSULIN ASPART 100 UNIT/ML ~~LOC~~ SOLN
0.0000 [IU] | SUBCUTANEOUS | Status: DC
  Administered 2019-10-17 (×2): 1 [IU] via SUBCUTANEOUS
  Administered 2019-10-17: 2 [IU] via SUBCUTANEOUS
  Administered 2019-10-17: 4 [IU] via SUBCUTANEOUS
  Administered 2019-10-17: 2 [IU] via SUBCUTANEOUS
  Administered 2019-10-18 (×2): 1 [IU] via SUBCUTANEOUS
  Administered 2019-10-18: 2 [IU] via SUBCUTANEOUS
  Administered 2019-10-18: 1 [IU] via SUBCUTANEOUS
  Administered 2019-10-18: 2 [IU] via SUBCUTANEOUS
  Administered 2019-10-19 (×2): 1 [IU] via SUBCUTANEOUS
  Administered 2019-10-20: 20:00:00 2 [IU] via SUBCUTANEOUS
  Administered 2019-10-20 – 2019-10-30 (×7): 1 [IU] via SUBCUTANEOUS

## 2019-10-16 MED ORDER — SODIUM CHLORIDE 0.45 % IV SOLN: INTRAVENOUS | Status: DC

## 2019-10-16 MED ORDER — INSULIN ASPART 100 UNIT/ML ~~LOC~~ SOLN
3.0000 [IU] | SUBCUTANEOUS | Status: DC
  Administered 2019-10-17 – 2019-11-01 (×45): 3 [IU] via SUBCUTANEOUS

## 2019-10-16 NOTE — Progress Notes (Addendum)
NAME:  [name], MRN:  [id number], DOB:  03/28/1976, LOS: 2 ADMISSION DATE:  10/14/2019,  CHIEF COMPLAINT: Altered mental status  Brief History   Patient is a 43 year old gentleman with a childhood brain tumor and developmental delay as well as seizures disorder who lives in a nursing home.  He presents with several days of decreased oral intake dehydration and decreased mentation and mental status from baseline.  Was admitted early the morning of 12/29 found to be in septic shock, severe dehydration with severe hypernatremia, with suspected source to be UTI.  He was intubated for worsening respiratory acidosis and clinical coma.   Consults:  None  Procedures:  12/20 intubation 12/20 central line  12/20 influenza negative  Significant Diagnostic Tests:    Micro Data:  COVID-19 12/20 >test negative, this was PCRx2 Urine culture 12/20 > E. Coli, susceptibilities pending  Blood culture 12/19 >   Antimicrobials:  12/20 cefepime >  12/20 Flagyl x1 12/20 vancomycin > 12/21  Interim history/subjective:  RN reports no acute events overnight, remains on extensive extensive pressor support.   Objective   Blood pressure (!) 79/64, pulse 87, temperature 99 F (37.2 C), resp. rate 16, height 5\' 11"  (1.803 m), weight 58.8 kg, SpO2 100 %.    Vent Mode: PRVC FiO2 (%):  [40 %-50 %] 40 % Set Rate:  [16 bmp] 16 bmp Vt Set:  [600 mL] 600 mL PEEP:  [5 cmH20] 5 cmH20 Plateau Pressure:  [18 cmH20-22 cmH20] 20 cmH20   Intake/Output Summary (Last 24 hours) at 10/16/2019 0846 Last data filed at 10/16/2019 0700 Gross per 24 hour  Intake 5307.52 ml  Output 2495 ml  Net 2812.52 ml   Filed Weights   10/14/19 2104 10/15/19 0440 10/16/19 0400  Weight: 50 kg 52.6 kg 58.8 kg    Examination: General: Chronically ill appearing adult male on mechanical ventilation, in NAD HEENT: ETT, MM pink/moist, PERRL,  Neuro: Opens eyes to verbal stimuli, tracks in room, unable to follow simple  commands, HX of developmental delay  CV: s1s2 regular rate and rhythm, no murmur, rubs, or gallops,  PULM:  Clear to ascultation bilaterally, no increased work of breathing, no breathing over vent, non added breath sounds  GI: soft, bowel sounds active in all 4 quadrants, non-tender, non-distended, tolerating TF Extremities: warm/dry, no edema  Skin: no rashes or lesions  Assessment & Plan:  Patient is a 43 year old gentleman with a history of developmental delay who is bedbound in a nursing home   Acute hypoxemic respiratory failure - intubated secondary, to clinical coma Plan: Continue ventilator support with lung protective strategies  Wean PEEP and FiO2 for sats greater than 90%. Head of bed elevated 30 degrees. Plateau pressures less than 30 cm H20.  Follow intermittent chest x-ray and ABG.   SAT/SBT as tolerated, mentation preclude extubation  Ensure adequate pulmonary hygiene  Follow cultures  VAP bundle in place  PAD protocol  Acute uremic encephalopathy -In the setting of sepsis and severe electrolyte derangements  Plan: Traitement for sepsis as below Correct electrolytes as below Delirium precautions Minimize sedation as able   Septic shock secondary to likely UTI Plan: Remain in ICU Vent support as above  Follow cultures  Continue IV Cefepime  Enteral hydration  Pressor support for MAP goal > 65  Monitor urine output Continue stress dose steroids while pressor support is needed   Acute kidney injury, likely prerenal with component of intrinsic from UTI -in the setting of Sepsis. Baseline creatinine  0.70, creatinine on admission 5.99 -Improving  P: Follow renal function / urine output Trend Bmet Avoid nephrotoxins, ensure adequate renal perfusion  IV hydration  Multiple electrolyte abnormalities including:  Hypocalcemia hypokalemia hypernatremia hyperchloremia Plan: Trend levels q4hrs Supplement as needed  Initiate Free water flushes today   Deep  tissue injury present on admission Plan: Frequent wound care Q2hr turns  Consult WOC   Severe protein calorie malnutrition Plan: Tube feeds imitated today Dietitian consulted  Monitor for refeeding syndrome    Best practice:  Diet: N.p.o., too ill for tube feed at this time Pain/Anxiety/Delirium protocol (if indicated): We will monitor, currently not on any sedation, limit benzos VAP protocol (if indicated): Ordered DVT prophylaxis: Subcu heparin GI prophylaxis: On PPI Glucose control: We will monitor currently under 180 Foley yes needed for critical illness Mobility: Bedrest Code Status: Full code, discussed with sister Family Communication: Sister updated early this morning after intubation Disposition: Needs ICU   Labs and Imaging   Chest xray 12/20 reviewed - he has right sided perihilar opacity possible pna. Scoliosis  CBC: Recent Labs  Lab 10/14/19 2000 10/14/19 2200 10/15/19 0331 10/15/19 0520 10/15/19 0641 10/15/19 1037 10/16/19 0350  WBC 2.3* 1.4* 1.3*  --   --   --  5.8  NEUTROABS 1.2*  --   --   --   --   --   --   HGB 12.7* 11.1* 9.7* 9.9* 9.5* 9.9* 11.8*  HCT 44.6 38.3* 33.3* 29.0* 28.0* 29.0* 37.5*  MCV 111.5* 109.7* 107.1*  --   --   --  100.0  PLT 53* 35* 37*  --   --   --  47*    Basic Metabolic Panel: Recent Labs  Lab 10/15/19 0804 10/15/19 1037 10/15/19 1138 10/15/19 1554 10/15/19 1928 10/16/19 0303  NA 165* 167* 163* 162* 163* 165*  K 2.9* 2.6* 2.6* 2.9* 3.0* 3.0*  CL >130* 130* >130* >130* >130* >130*  CO2 19*  --  18* 18* 16* 17*  GLUCOSE 108* 93 115* 128* 132* 136*  BUN 116* 106* 112* 106* 102* 94*  CREATININE 3.99* 3.50* 3.65* 3.49* 3.18* 2.77*  CALCIUM 6.1*  --  6.0* 6.5* 7.9* 7.6*  PHOS  --   --  3.2  --   --   --    GFR: Estimated Creatinine Clearance: 28.9 mL/min (A) (by C-G formula based on SCr of 2.77 mg/dL (H)). Recent Labs  Lab 10/14/19 2000 10/14/19 2030 10/14/19 2200 10/15/19 0331 10/16/19 0350  WBC 2.3*   --  1.4* 1.3* 5.8  LATICACIDVEN  --  4.1* 4.3* 2.0*  --     Liver Function Tests: Recent Labs  Lab 10/14/19 2000  AST 54*  ALT 18  ALKPHOS 43  BILITOT 1.3*  PROT 6.9  ALBUMIN 2.7*   No results for input(s): LIPASE, AMYLASE in the last 168 hours. No results for input(s): AMMONIA in the last 168 hours.  ABG    Component Value Date/Time   PHART 7.470 (H) 10/15/2019 0641   PCO2ART 29.8 (L) 10/15/2019 0641   PO2ART 67.0 (L) 10/15/2019 0641   HCO3 21.9 10/15/2019 0641   TCO2 18 (L) 10/15/2019 1037   ACIDBASEDEF 2.0 10/15/2019 0641   O2SAT 95.0 10/15/2019 0641     Coagulation Profile: Recent Labs  Lab 10/14/19 2030  INR 1.2    Cardiac Enzymes: No results for input(s): CKTOTAL, CKMB, CKMBINDEX, TROPONINI in the last 168 hours.  HbA1C: No results found for: HGBA1C  CBG: Recent Labs  Lab 10/15/19 0211 10/15/19 1930 10/15/19 2318 10/16/19 0303 10/16/19 0724  GLUCAP 88 119* 113* 117* 104*    Critical care time:    Performed by: Johnsie Cancel   Total critical care time: 40 minutes  Critical care time was exclusive of separately billable procedures and treating other patients.  Critical care was necessary to treat or prevent imminent or life-threatening deterioration.  Critical care was time spent personally by me on the following activities: development of treatment plan with patient and/or surrogate as well as nursing, discussions with consultants, evaluation of patient's response to treatment, examination of patient, obtaining history from patient or surrogate, ordering and performing treatments and interventions, ordering and review of laboratory studies, ordering and review of radiographic studies, pulse oximetry and re-evaluation of patient's condition.  Johnsie Cancel, NP-C [facility] Contact / Pager information can be found on Amion  10/16/2019, 9:50 AM

## 2019-10-16 NOTE — Progress Notes (Signed)
[facility] Progress Note Patient Name: [name] DOB: 01/31/1976 MRN: [id number]   Date of Service  10/16/2019  HPI/Events of Note  Hyperglycemia - Blood glucose = 195.   eICU Interventions  Will order: 1. Q 4 hour sensitive Novolog SSI + 3 units tube feed coverage.     Intervention Category Major Interventions: Hyperglycemia - active titration of insulin therapy  Lysle Dingwall 10/16/2019, 10:28 PM

## 2019-10-16 NOTE — Progress Notes (Signed)
Initial Nutrition Assessment  DOCUMENTATION CODES:   Severe malnutrition in context of social or environmental circumstances, Underweight  INTERVENTION:    Vital AF 1.2 at 25 ml/h, increase by 10 ml every 12 hours to goal rate of 65 ml/h (1560 ml per day)   Provides 1872 kcal, 117 gm protein, 1265 ml free water daily   Monitor magnesium, potassium, and phosphorus, MD to replete as needed, as pt is at risk for refeeding syndrome given severe PCM.   NUTRITION DIAGNOSIS:   Severe Malnutrition related to social / environmental circumstances(developmental delay) as evidenced by severe fat depletion, severe muscle depletion, percent weight loss(26.5% weight loss within the past year).  GOAL:   Patient will meet greater than or equal to 90% of their needs  MONITOR:   Vent status, TF tolerance, Labs, Skin, Weight trends  REASON FOR ASSESSMENT:   Ventilator, Consult Enteral/tube feeding initiation and management  ASSESSMENT:   43 yo male admitted with septic shock, severe dehydration, severe hypernatremia, suspected source UTI. Recent several days of decreased oral intake, decreased mental status. 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  Received MD Consult for TF initiation and management. OG tube in place. Currently receiving Vital High Protein at 40 ml/h with Pro-stat 30 ml BID. Free water 300 ml every 4 hours.   Patient is currently intubated on ventilator support MV: 10 L/min Temp (24hrs), Avg:99.3 F (37.4 C), Min:97.9 F (36.6 C), Max:99.9 F (37.7 C)  Propofol: none  Labs reviewed. Sodium 164 (H), K 4.3 WNL, BUN 90 (H), creatinine 2.55 (H), magnesium 2.7 (H), phosphorus 3.7 WNL CBG's: [phone]  Medications reviewed and include IV KCl, neosynephrine, levophed, Solu-cortef.  Usual weights reviewed. Patient has lost 26.5% of usual weight within the past year, which is significant for the time frame. He  meets criteria for severe PCM with severe muscle and fat depletion and significant weight loss.   NUTRITION - FOCUSED PHYSICAL EXAM:    Most Recent Value  Orbital Region  Severe depletion  Upper Arm Region  Severe depletion  Thoracic and Lumbar Region  Moderate depletion  Buccal Region  Unable to assess  Temple Region  Severe depletion  Clavicle Bone Region  Severe depletion  Clavicle and Acromion Bone Region  Severe depletion  Scapular Bone Region  Unable to assess  Dorsal Hand  Mild depletion  Patellar Region  Severe depletion  Anterior Thigh Region  Severe depletion  Posterior Calf Region  Severe depletion  Edema (RD Assessment)  Mild  Hair  Reviewed  Eyes  Unable to assess  Mouth  Unable to assess  Skin  Reviewed  Nails  Reviewed       Diet Order:   Diet Order            Diet NPO time specified  Diet effective now              EDUCATION NEEDS:   No education needs have been identified at this time  Skin:  Skin Assessment: Skin Integrity Issues: Skin Integrity Issues:: DTI, Stage II DTI: sacrum Stage II: sacrum  Last BM:  12/20 type 5  Height:   Ht Readings from Last 1 Encounters:  10/14/19 5\' 11"  (1.803 m)    Weight:   Wt Readings from Last 1 Encounters:  10/16/19 58.8 kg    Ideal Body Weight:  78.2 kg  BMI:  Body mass index is 18.08 kg/m.  Estimated Nutritional Needs:  Kcal:  1850  Protein:  90-105 gm  Fluid:  >/= 1.8 L    Molli Barrows, RD, LDN, [place] Pager [phone] After Hours Pager [phone]

## 2019-10-17 DIAGNOSIS — N10 Acute tubulo-interstitial nephritis: Secondary | ICD-10-CM

## 2019-10-17 DIAGNOSIS — R6250 Unspecified lack of expected normal physiological development in childhood: Secondary | ICD-10-CM

## 2019-10-17 LAB — BASIC METABOLIC PANEL
Anion gap: 11 (ref 5–15)
Anion gap: 9 (ref 5–15)
Anion gap: 9 (ref 5–15)
BUN: 83 mg/dL — ABNORMAL HIGH (ref 6–20)
BUN: 70 mg/dL — ABNORMAL HIGH (ref 6–20)
BUN: 66 mg/dL — ABNORMAL HIGH (ref 6–20)
BUN: 66 mg/dL — ABNORMAL HIGH (ref 6–20)
CO2: 16 mmol/L — ABNORMAL LOW (ref 22–32)
CO2: 18 mmol/L — ABNORMAL LOW (ref 22–32)
CO2: 18 mmol/L — ABNORMAL LOW (ref 22–32)
CO2: 18 mmol/L — ABNORMAL LOW (ref 22–32)
Calcium: 7.2 mg/dL — ABNORMAL LOW (ref 8.9–10.3)
Calcium: 7 mg/dL — ABNORMAL LOW (ref 8.9–10.3)
Calcium: 7 mg/dL — ABNORMAL LOW (ref 8.9–10.3)
Calcium: 6.8 mg/dL — ABNORMAL LOW (ref 8.9–10.3)
Chloride: >130 mmol/L (ref 98–111)
Chloride: 127 mmol/L — ABNORMAL HIGH (ref 98–111)
Chloride: 127 mmol/L — ABNORMAL HIGH (ref 98–111)
Chloride: 124 mmol/L — ABNORMAL HIGH (ref 98–111)
Creatinine, Ser: 2.01 mg/dL — ABNORMAL HIGH (ref 0.61–1.24)
Creatinine, Ser: 1.59 mg/dL — ABNORMAL HIGH (ref 0.61–1.24)
Creatinine, Ser: 1.41 mg/dL — ABNORMAL HIGH (ref 0.61–1.24)
Creatinine, Ser: 1.23 mg/dL (ref 0.61–1.24)
GFR calc Af Amer: 46 mL/min — ABNORMAL LOW (ref >60)
GFR calc Af Amer: >60 mL/min (ref >60)
GFR calc Af Amer: >60 mL/min (ref >60)
GFR calc Af Amer: >60 mL/min (ref >60)
GFR calc non Af Amer: 40 mL/min — ABNORMAL LOW (ref >60)
GFR calc non Af Amer: 53 mL/min — ABNORMAL LOW (ref >60)
GFR calc non Af Amer: >60 mL/min (ref >60)
GFR calc non Af Amer: >60 mL/min (ref >60)
Glucose, Bld: 187 mg/dL — ABNORMAL HIGH (ref 70–99)
Glucose, Bld: 170 mg/dL — ABNORMAL HIGH (ref 70–99)
Glucose, Bld: 122 mg/dL — ABNORMAL HIGH (ref 70–99)
Glucose, Bld: 134 mg/dL — ABNORMAL HIGH (ref 70–99)
Potassium: 3 mmol/L — ABNORMAL LOW (ref 3.5–5.1)
Potassium: 2.5 mmol/L — CL (ref 3.5–5.1)
Potassium: 4.1 mmol/L (ref 3.5–5.1)
Potassium: 2.6 mmol/L — CL (ref 3.5–5.1)
Sodium: 160 mmol/L — ABNORMAL HIGH (ref 135–145)
Sodium: 156 mmol/L — ABNORMAL HIGH (ref 135–145)
Sodium: 154 mmol/L — ABNORMAL HIGH (ref 135–145)
Sodium: 151 mmol/L — ABNORMAL HIGH (ref 135–145)

## 2019-10-17 LAB — GLUCOSE, CAPILLARY
Glucose-Capillary: 151 mg/dL — ABNORMAL HIGH (ref 70–99)
Glucose-Capillary: 144 mg/dL — ABNORMAL HIGH (ref 70–99)
Glucose-Capillary: 128 mg/dL — ABNORMAL HIGH (ref 70–99)
Glucose-Capillary: 128 mg/dL — ABNORMAL HIGH (ref 70–99)
Glucose-Capillary: 95 mg/dL (ref 70–99)

## 2019-10-17 LAB — CBC
HCT: 29 % — ABNORMAL LOW (ref 39.0–52.0)
Hemoglobin: 8.9 g/dL — ABNORMAL LOW (ref 13.0–17.0)
MCH: 31.8 pg (ref 26.0–34.0)
MCHC: 30.7 g/dL (ref 30.0–36.0)
MCV: 103.6 fL — ABNORMAL HIGH (ref 80.0–100.0)
Platelets: 41 10*3/uL — ABNORMAL LOW (ref 150–400)
RBC: 2.8 MIL/uL — ABNORMAL LOW (ref 4.22–5.81)
RDW: 19 % — ABNORMAL HIGH (ref 11.5–15.5)
WBC: 6 10*3/uL (ref 4.0–10.5)
nRBC: 1.3 % — ABNORMAL HIGH (ref 0.0–0.2)

## 2019-10-17 LAB — PHOSPHORUS
Phosphorus: 2.4 mg/dL — ABNORMAL LOW (ref 2.5–4.6)
Phosphorus: 2.4 mg/dL — ABNORMAL LOW (ref 2.5–4.6)
Phosphorus: 1.7 mg/dL — ABNORMAL LOW (ref 2.5–4.6)

## 2019-10-17 LAB — HEMOGLOBIN A1C
Hgb A1c MFr Bld: 5.3 % (ref 4.8–5.6)
Mean Plasma Glucose: 105.41 mg/dL

## 2019-10-17 LAB — URINE CULTURE: Culture: >=100000 — AB

## 2019-10-17 LAB — MAGNESIUM
Magnesium: 2.4 mg/dL (ref 1.7–2.4)
Magnesium: 2.2 mg/dL (ref 1.7–2.4)

## 2019-10-17 MED ORDER — POTASSIUM CHLORIDE 20 MEQ/15ML (10%) PO SOLN
40.0000 meq | Freq: Every day | ORAL | Status: DC
  Administered 2019-10-17: 40 meq via ORAL
  Filled 2019-10-17 (×2): qty 30

## 2019-10-17 MED ORDER — CEFAZOLIN SODIUM-DEXTROSE 1-4 GM/50ML-% IV SOLN
1.0000 g | Freq: Three times a day (TID) | INTRAVENOUS | Status: DC
  Administered 2019-10-17 – 2019-10-19 (×4): 1 g via INTRAVENOUS
  Filled 2019-10-17 (×6): qty 50

## 2019-10-17 MED ORDER — POTASSIUM CHLORIDE 10 MEQ/50ML IV SOLN
10.0000 meq | INTRAVENOUS | Status: AC
  Administered 2019-10-17 (×4): 10 meq via INTRAVENOUS
  Filled 2019-10-17 (×4): qty 50

## 2019-10-17 MED ORDER — POTASSIUM CHLORIDE 10 MEQ/50ML IV SOLN
10.0000 meq | INTRAVENOUS | Status: AC
  Administered 2019-10-18 (×6): 10 meq via INTRAVENOUS
  Filled 2019-10-17 (×6): qty 50

## 2019-10-17 MED ORDER — CHLORHEXIDINE GLUCONATE CLOTH 2 % EX PADS
6.0000 | MEDICATED_PAD | Freq: Every day | CUTANEOUS | Status: DC
  Administered 2019-10-17 – 2019-11-06 (×16): 6 via TOPICAL

## 2019-10-17 MED FILL — Phenylephrine HCl IV Soln 10 MG/ML: INTRAVENOUS | Qty: 10 | Status: AC

## 2019-10-17 MED FILL — Sodium Chloride IV Soln 0.9%: INTRAVENOUS | Qty: 250 | Status: AC

## 2019-10-17 NOTE — Progress Notes (Signed)
[facility] Progress Note Patient Name: CALIXTO VASILOPOULOS DOB: [birth year] MRN: [id number]   Date of Service  10/17/2019  HPI/Events of Note  Hypokalemia - K+ = 2.6 and Creatinine = 1.23.   eICU Interventions  Will replace K+.      Intervention Category Major Interventions: Electrolyte abnormality - evaluation and management  Angenette Daily Eugene 10/17/2019, 11:52 PM

## 2019-10-17 NOTE — Progress Notes (Signed)
NAME:  [name], MRN:  [id number], DOB:  Jul 30, 1976, LOS: 3 ADMISSION DATE:  10/14/2019,  CHIEF COMPLAINT: Altered mental status  Brief History   Patient is a 43 year old gentleman with a childhood brain tumor and developmental delay as well as seizures disorder who lives in a nursing home.  He presents with several days of decreased oral intake dehydration and decreased mentation and mental status from baseline.  Was admitted early the morning of 12/29 found to be in septic shock, severe dehydration with severe hypernatremia, with suspected source to be UTI.  He was intubated for worsening respiratory acidosis and clinical coma.   Consults:  None  Procedures:  12/20 intubation 12/20 central line 12/20 influenza negative  Significant Diagnostic Tests:    Micro Data:  COVID-19 12/20 > test negative, this was PCRx2 Urine culture 12/20 > E. Coli, resistant to Amoicillin, Trimeth/Sulfa, and Ampicillin/Sulbactam  Blood culture 12/19 >   Antimicrobials:  12/20 cefepime >  12/20 Flagyl x1 12/20 vancomycin > 12/21  Interim history/subjective:  RN reports no acute events overnight, requiring less pressor support.   Objective   Blood pressure 103/73, pulse (!) 54, temperature 97.6 F (36.4 C), temperature source Axillary, resp. rate 16, height 5\' 11"  (1.803 m), weight 60.1 kg, SpO2 99 %.    Vent Mode: PRVC FiO2 (%):  [40 %] 40 % Set Rate:  [16 bmp] 16 bmp Vt Set:  [600 mL] 600 mL PEEP:  [5 cmH20] 5 cmH20 Plateau Pressure:  [18 cmH20-22 cmH20] 21 cmH20   Intake/Output Summary (Last 24 hours) at 10/17/2019 0846 Last data filed at 10/17/2019 0600 Gross per 24 hour  Intake 2819.9 ml  Output 1480 ml  Net 1339.9 ml   Filed Weights   10/15/19 0440 10/16/19 0400 10/17/19 0437  Weight: 52.6 kg 58.8 kg 60.1 kg    Examination: General: Chronically ill appearing adult male on mechanical ventilation, in NAD HEENT: ETT, MM pink/moist, PERRL,  Neuro: Opens eyes to verbal  stimuli, unable to follow any commands, HX of developmental delay  CV: s1s2 regular rate and rhythm, no murmur, rubs, or gallops,  PULM:  Clear to ascultation bilaterally, no increased work of breathing  GI: soft, bowel sounds active in all 4 quadrants, non-tender, non-distended, tolerating TF Extremities: warm/dry, no  edema  Skin: no rashes or lesions  Assessment & Plan:  Patient is a 43 year old gentleman with a history of developmental delay who is bedbound in a nursing home   Acute hypoxemic respiratory failure - intubated secondary, to clinical coma Plan: Continue ventilator support with lung protective strategies  Wean PEEP and FiO2 for sats greater than 90%. Head of bed elevated 30 degrees. Plateau pressures less than 30 cm H20.  Follow intermittent chest x-ray and ABG.   SAT/SBT as tolerated, mentation preclude extubation  Ensure adequate pulmonary hygiene  Follow cultures  VAP bundle in place  PAD protocol  Acute uremic encephalopathy -In the setting of sepsis and severe electrolyte derangements  Plan: Treatment for sepsis as below  Correcting electrolytes slowly  Delirium precautions  Minimize sedation as able   Septic shock secondary to likely UTI Plan: Vent support as above  Continue IV antibiotics Follow blood cultures Enteral hydration  Wean pressors as able, MAP goal > 65 Continue stress dose steroids until pressors support resolves   Acute kidney injury, likely prerenal with component of intrinsic from UTI -in the setting of Sepsis. Baseline creatinine 0.70, creatinine on admission 5.99 -Improving  P: Follow renal function /  urine output Trend Bmet Avoid nephrotoxins, ensure adequate renal perfusion  Enteral hydration   Multiple electrolyte abnormalities including:  Hypocalcemia hypokalemia hypernatremia hyperchloremia Plan: Trend levels q4hrs Supplement as needed  Continue free water flushes   Deep tissue injury present on  admission Plan: Frequent wound care  Q2hr turns  Consult WOC   Severe protein calorie malnutrition Plan: Tube feeds initiated today  Dietitian consulted  Monitor for refeeding syndrome    Best practice:  Diet: N.p.o., too ill for tube feed at this time Pain/Anxiety/Delirium protocol (if indicated): We will monitor, currently not on any sedation, limit benzos VAP protocol (if indicated): Ordered DVT prophylaxis: Subcu heparin GI prophylaxis: On PPI Glucose control: We will monitor currently under 180 Foley yes needed for critical illness Mobility: Bedrest Code Status: Full code, discussed with sister Family Communication: Sister updated early this morning after intubation Disposition: Needs ICU   Labs and Imaging   Chest xray 12/20 reviewed - he has right sided perihilar opacity possible pna. Scoliosis  CBC: Recent Labs  Lab 10/14/19 2000 10/14/19 2200 10/15/19 0331 10/15/19 0520 10/15/19 0641 10/15/19 1037 10/16/19 0350  WBC 2.3* 1.4* 1.3*  --   --   --  5.8  NEUTROABS 1.2*  --   --   --   --   --   --   HGB 12.7* 11.1* 9.7* 9.9* 9.5* 9.9* 11.8*  HCT 44.6 38.3* 33.3* 29.0* 28.0* 29.0* 37.5*  MCV 111.5* 109.7* 107.1*  --   --   --  100.0  PLT 53* 35* 37*  --   --   --  47*    Basic Metabolic Panel: Recent Labs  Lab 10/15/19 1138 10/15/19 1928 10/16/19 0303 10/16/19 1015 10/16/19 1751 10/16/19 2351  NA 163* 163* 165* 164* 161* 160*  K 2.6* 3.0* 3.0* 4.3 3.2* 3.0*  CL >130* >130* >130* >130* >130* >130*  CO2 18* 16* 17* 16* 17* 16*  GLUCOSE 115* 132* 136* 143* 246* 187*  BUN 112* 102* 94* 90* 87* 83*  CREATININE 3.65* 3.18* 2.77* 2.55* 2.57* 2.01*  CALCIUM 6.0* 7.9* 7.6* 7.6* 7.4* 7.2*  MG  --   --   --  2.7* 2.5* 2.4  PHOS 3.2  --   --  3.7 2.4* 2.4*   GFR: Estimated Creatinine Clearance: 40.7 mL/min (A) (by C-G formula based on SCr of 2.01 mg/dL (H)). Recent Labs  Lab 10/14/19 2000 10/14/19 2030 10/14/19 2200 10/15/19 0331 10/16/19 0350   WBC 2.3*  --  1.4* 1.3* 5.8  LATICACIDVEN  --  4.1* 4.3* 2.0*  --     Liver Function Tests: Recent Labs  Lab 10/14/19 2000  AST 54*  ALT 18  ALKPHOS 43  BILITOT 1.3*  PROT 6.9  ALBUMIN 2.7*   No results for input(s): LIPASE, AMYLASE in the last 168 hours. No results for input(s): AMMONIA in the last 168 hours.  ABG    Component Value Date/Time   PHART 7.470 (H) 10/15/2019 0641   PCO2ART 29.8 (L) 10/15/2019 0641   PO2ART 67.0 (L) 10/15/2019 0641   HCO3 21.9 10/15/2019 0641   TCO2 18 (L) 10/15/2019 1037   ACIDBASEDEF 2.0 10/15/2019 0641   O2SAT 95.0 10/15/2019 0641     Coagulation Profile: Recent Labs  Lab 10/14/19 2030  INR 1.2    Cardiac Enzymes: No results for input(s): CKTOTAL, CKMB, CKMBINDEX, TROPONINI in the last 168 hours.  HbA1C: Hgb A1c MFr Bld  Date/Time Value Ref Range Status  10/16/2019 11:51 PM 5.3  4.8 - 5.6 % Final    Comment:    (NOTE) Pre diabetes:          5.7%-6.4% Diabetes:              >6.4% Glycemic control for   <7.0% adults with diabetes     CBG: Recent Labs  Lab 10/16/19 1527 10/16/19 1957 10/16/19 2332 10/17/19 0349 10/17/19 0758  GLUCAP 200* 195* 168* 151* 144*    Critical care time:    Performed by: Johnsie Cancel   Total critical care time: 38 minutes  Critical care time was exclusive of separately billable procedures and treating other patients.  Critical care was necessary to treat or prevent imminent or life-threatening deterioration.  Critical care was time spent personally by me on the following activities: development of treatment plan with patient and/or surrogate as well as nursing, discussions with consultants, evaluation of patient's response to treatment, examination of patient, obtaining history from patient or surrogate, ordering and performing treatments and interventions, ordering and review of laboratory studies, ordering and review of radiographic studies, pulse oximetry and re-evaluation of  patient's condition.  Johnsie Cancel, NP-C [facility] Contact / Pager information can be found on Amion  10/17/2019, 8:46 AM

## 2019-10-17 NOTE — Progress Notes (Signed)
CRITICAL VALUE ALERT  Critical Value:  Potassium   Date & Time Notied:  10/17/19 23:44   Provider Notified: E-link RN   Orders Received/Actions taken: Waiting for orders.

## 2019-10-18 ENCOUNTER — Encounter (HOSPITAL_COMMUNITY): Payer: Self-pay | Admitting: Pulmonary Disease

## 2019-10-18 ENCOUNTER — Other Ambulatory Visit: Payer: Self-pay

## 2019-10-18 DIAGNOSIS — J9601 Acute respiratory failure with hypoxia: Secondary | ICD-10-CM

## 2019-10-18 DIAGNOSIS — J9602 Acute respiratory failure with hypercapnia: Secondary | ICD-10-CM

## 2019-10-18 DIAGNOSIS — E43 Unspecified severe protein-calorie malnutrition: Secondary | ICD-10-CM

## 2019-10-18 DIAGNOSIS — E870 Hyperosmolality and hypernatremia: Secondary | ICD-10-CM

## 2019-10-18 LAB — BASIC METABOLIC PANEL
Anion gap: 8 (ref 5–15)
Anion gap: 10 (ref 5–15)
Anion gap: 9 (ref 5–15)
BUN: 61 mg/dL — ABNORMAL HIGH (ref 6–20)
BUN: 56 mg/dL — ABNORMAL HIGH (ref 6–20)
BUN: 50 mg/dL — ABNORMAL HIGH (ref 6–20)
CO2: 20 mmol/L — ABNORMAL LOW (ref 22–32)
CO2: 18 mmol/L — ABNORMAL LOW (ref 22–32)
CO2: 18 mmol/L — ABNORMAL LOW (ref 22–32)
Calcium: 7 mg/dL — ABNORMAL LOW (ref 8.9–10.3)
Calcium: 6.8 mg/dL — ABNORMAL LOW (ref 8.9–10.3)
Calcium: 6.9 mg/dL — ABNORMAL LOW (ref 8.9–10.3)
Chloride: 125 mmol/L — ABNORMAL HIGH (ref 98–111)
Chloride: 125 mmol/L — ABNORMAL HIGH (ref 98–111)
Chloride: 124 mmol/L — ABNORMAL HIGH (ref 98–111)
Creatinine, Ser: 1.21 mg/dL (ref 0.61–1.24)
Creatinine, Ser: 1.24 mg/dL (ref 0.61–1.24)
Creatinine, Ser: 1.03 mg/dL (ref 0.61–1.24)
GFR calc Af Amer: >60 mL/min (ref >60)
GFR calc Af Amer: >60 mL/min (ref >60)
GFR calc Af Amer: >60 mL/min (ref >60)
GFR calc non Af Amer: >60 mL/min (ref >60)
GFR calc non Af Amer: >60 mL/min (ref >60)
GFR calc non Af Amer: >60 mL/min (ref >60)
Glucose, Bld: 141 mg/dL — ABNORMAL HIGH (ref 70–99)
Glucose, Bld: 160 mg/dL — ABNORMAL HIGH (ref 70–99)
Glucose, Bld: 146 mg/dL — ABNORMAL HIGH (ref 70–99)
Potassium: 3.3 mmol/L — ABNORMAL LOW (ref 3.5–5.1)
Potassium: 3.2 mmol/L — ABNORMAL LOW (ref 3.5–5.1)
Potassium: 3.1 mmol/L — ABNORMAL LOW (ref 3.5–5.1)
Sodium: 153 mmol/L — ABNORMAL HIGH (ref 135–145)
Sodium: 153 mmol/L — ABNORMAL HIGH (ref 135–145)
Sodium: 151 mmol/L — ABNORMAL HIGH (ref 135–145)

## 2019-10-18 LAB — GLUCOSE, CAPILLARY
Glucose-Capillary: 102 mg/dL — ABNORMAL HIGH (ref 70–99)
Glucose-Capillary: 123 mg/dL — ABNORMAL HIGH (ref 70–99)
Glucose-Capillary: 131 mg/dL — ABNORMAL HIGH (ref 70–99)
Glucose-Capillary: 132 mg/dL — ABNORMAL HIGH (ref 70–99)
Glucose-Capillary: 139 mg/dL — ABNORMAL HIGH (ref 70–99)
Glucose-Capillary: 151 mg/dL — ABNORMAL HIGH (ref 70–99)
Glucose-Capillary: 96 mg/dL (ref 70–99)

## 2019-10-18 LAB — LEGIONELLA PNEUMOPHILA SEROGP 1 UR AG: L. pneumophila Serogp 1 Ur Ag: NEGATIVE

## 2019-10-18 MED ORDER — FENTANYL CITRATE (PF) 100 MCG/2ML IJ SOLN
25.0000 ug | INTRAMUSCULAR | Status: DC | PRN
  Filled 2019-10-18: qty 2

## 2019-10-18 MED ORDER — POTASSIUM CHLORIDE 10 MEQ/50ML IV SOLN
10.0000 meq | INTRAVENOUS | Status: AC
  Administered 2019-10-18 (×2): 10 meq via INTRAVENOUS
  Filled 2019-10-18 (×2): qty 50

## 2019-10-18 MED ORDER — POTASSIUM CHLORIDE 20 MEQ/15ML (10%) PO SOLN
40.0000 meq | Freq: Two times a day (BID) | ORAL | Status: AC
  Administered 2019-10-18 (×2): 40 meq via ORAL
  Filled 2019-10-18 (×2): qty 30

## 2019-10-18 MED ORDER — ARTIFICIAL TEARS OPHTHALMIC OINT
TOPICAL_OINTMENT | OPHTHALMIC | Status: DC | PRN
  Filled 2019-10-18: qty 3.5

## 2019-10-18 MED ORDER — INFLUENZA VAC SPLIT QUAD 0.5 ML IM SUSY
0.5000 mL | PREFILLED_SYRINGE | INTRAMUSCULAR | Status: AC
  Administered 2019-10-20: 0.5 mL via INTRAMUSCULAR
  Filled 2019-10-18: qty 0.5

## 2019-10-18 MED ORDER — DEXTROSE 5 % IV SOLN: INTRAVENOUS | Status: DC

## 2019-10-18 MED ORDER — PNEUMOCOCCAL VAC POLYVALENT 25 MCG/0.5ML IJ INJ
0.5000 mL | INJECTION | INTRAMUSCULAR | Status: AC
  Administered 2019-10-20: 0.5 mL via INTRAMUSCULAR
  Filled 2019-10-18: qty 0.5

## 2019-10-18 MED ORDER — POTASSIUM CHLORIDE 20 MEQ/15ML (10%) PO SOLN
40.0000 meq | Freq: Every day | ORAL | Status: DC
  Administered 2019-10-18 – 2019-10-19 (×2): 40 meq
  Filled 2019-10-18 (×2): qty 30

## 2019-10-18 NOTE — Progress Notes (Signed)
[place] Progress Note Patient Name: [name] DOB: 29-Apr-1976 MRN: [id number]   Date of Service  10/18/2019  HPI/Events of Note  Hypernatremia - Na+ = 151 --> 153. Already on 0.45 NaCl and free water 300 mL per tube Q 4 hours.   eICU Interventions  Will increase 0.45 NaCl IV fluid to 125 mL/hour. Continue to trend Na+     Intervention Category Major Interventions: Electrolyte abnormality - evaluation and management  Leela Vanbrocklin [place] 10/18/2019, 6:22 AM

## 2019-10-18 NOTE — Progress Notes (Addendum)
NAME:  [name], MRN:  [id number], DOB:  1976/06/17, LOS: 4 ADMISSION DATE:  10/14/2019,  CHIEF COMPLAINT: Altered mental status  Brief History   Patient is a 43 year old gentleman with a childhood brain tumor and developmental delay as well as seizures disorder who lives in a nursing home.  He presents with several days of decreased oral intake dehydration and decreased mentation and mental status from baseline.  Was admitted early the morning of 12/29 found to be in septic shock, severe dehydration with severe hypernatremia, with suspected source to be UTI.  He was intubated for worsening respiratory acidosis and clinical coma.   Consults:  None  Procedures:  12/20 intubation 12/20 central line 12/20 influenza negative  Significant Diagnostic Tests:    Micro Data:  COVID-19 12/20 > test negative, this was PCRx2 Urine culture 12/20 > E. Coli, resistant to Amoicillin, Trimeth/Sulfa, and Ampicillin/Sulbactam  Blood culture 12/19 >   Antimicrobials:  12/20 cefepime >  12/20 Flagyl x1 12/20 vancomycin > 12/21  Interim history/subjective:  RN reports some bradycardia during rest. Currently weaning well this morning   Objective   Blood pressure 113/78, pulse (!) 45, temperature 98.2 F (36.8 C), temperature source Oral, resp. rate 20, height 5\' 11"  (1.803 m), weight 65.6 kg, SpO2 100 %.    Vent Mode: PSV;CPAP FiO2 (%):  [40 %] 40 % Set Rate:  [16 bmp] 16 bmp Vt Set:  [600 mL] 600 mL PEEP:  [5 cmH20] 5 cmH20 Pressure Support:  [12 cmH20] 12 cmH20 Plateau Pressure:  [19 cmH20-22 cmH20] 20 cmH20   Intake/Output Summary (Last 24 hours) at 10/18/2019 0834 Last data filed at 10/18/2019 0700 Gross per 24 hour  Intake 3568.57 ml  Output 2325 ml  Net 1243.57 ml   Filed Weights   10/16/19 0400 10/17/19 0437 10/18/19 0425  Weight: 58.8 kg 60.1 kg 65.6 kg    Examination: General: Chronically ill appearing adult male on mechanical ventilation, in NAD HEENT: ETT, MM  pink/moist, PERRL,  Neuro: Alert and more interactive this morning, remains unable to follow commands with HX of developmental delay CV: s1s2 regular rate and rhythm, no murmur, rubs, or gallops,  PULM:  Clear to ascultation bilaterally, no added breath sounds, tolerating weaning GI: soft, bowel sounds active in all 4 quadrants, non-tender, non-distended, tolerating TF Extremities: warm/dry, no edema  Skin: no rashes or lesions   Resolved problems :  Acute kidney injury, likely prerenal with component of intrinsic from UTI  Assessment & Plan:  Patient is a 43 year old gentleman with a history of developmental delay who is bedbound in a nursing home   Acute hypoxemic respiratory failure - intubated secondary, to clinical coma Plan: Tolerating SBT this AM, likely extubate later today  Continue ventilator support with lung protective strategies  Wean PEEP and FiO2 for sats greater than 90%. Head of bed elevated 30 degrees. Plateau pressures less than 30 cm H20.  Ensure adequate pulmonary hygiene  Follow cultures  VAP bundle in place  PAD protocol  Acute uremic encephalopathy -In the setting of sepsis and severe electrolyte derangements  -Improving  Plan: Correcting electrolytes slowly Delirium precautions  Minimize sedation as able   Septic shock secondary to likely UTI Plan: Pressor support improving, continue to wean  Continue IV antibiotics  Enteral hydration Continue stress dose steroids until need for pressor support resolves   Multiple electrolyte abnormalities including:  Hypocalcemia hypokalemia hypernatremia hyperchloremia Plan: Trend levels q8hrs  Supplement as needed  Continue free water flushes  Deep tissue injury present on admission Plan: Frequent wound care  Q2hr turns  WOC    Severe protein calorie malnutrition Plan: Tube feeds  Dietitian consulted  Monitor for refeeding syndrome   Best practice:  Diet: Tube feeds Pain/Anxiety/Delirium  protocol (if indicated): PRN fentanyl  VAP protocol (if indicated): Ordered DVT prophylaxis: Subcu heparin GI prophylaxis: On PPI  Glucose control: Monitor Foley yes needed for critical illness Mobility: Bedrest Code Status: Full code, discussed with sister Family Communication: Sister updated early this morning after intubation Disposition: Needs ICU  Labs and Imaging   Chest xray 12/20 reviewed - he has right sided perihilar opacity possible pna. Scoliosis  CBC: Recent Labs  Lab 10/14/19 2000 10/14/19 2200 10/15/19 0331 10/15/19 0520 10/15/19 0641 10/15/19 1037 10/16/19 0350 10/17/19 1030  WBC 2.3* 1.4* 1.3*  --   --   --  5.8 6.0  NEUTROABS 1.2*  --   --   --   --   --   --   --   HGB 12.7* 11.1* 9.7* 9.9* 9.5* 9.9* 11.8* 8.9*  HCT 44.6 38.3* 33.3* 29.0* 28.0* 29.0* 37.5* 29.0*  MCV 111.5* 109.7* 107.1*  --   --   --  100.0 103.6*  PLT 53* 35* 37*  --   --   --  47* 41*    Basic Metabolic Panel: Recent Labs  Lab 10/16/19 1015 10/16/19 1751 10/16/19 2351 10/17/19 1030 10/17/19 1744 10/17/19 2201 10/18/19 0418  NA 164* 161* 160* 156* 154* 151* 153*  K 4.3 3.2* 3.0* 2.5* 4.1 2.6* 3.3*  CL >130* >130* >130* 127* 127* 124* 125*  CO2 16* 17* 16* 18* 18* 18* 20*  GLUCOSE 143* 246* 187* 170* 122* 134* 141*  BUN 90* 87* 83* 70* 66* 66* 61*  CREATININE 2.55* 2.57* 2.01* 1.59* 1.41* 1.23 1.21  CALCIUM 7.6* 7.4* 7.2* 7.0* 7.0* 6.8* 7.0*  MG 2.7* 2.5* 2.4  --  2.2  --   --   PHOS 3.7 2.4* 2.4* 2.4* 1.7*  --   --    GFR: Estimated Creatinine Clearance: 73.8 mL/min (by C-G formula based on SCr of 1.21 mg/dL). Recent Labs  Lab 10/14/19 2030 10/14/19 2200 10/15/19 0331 10/16/19 0350 10/17/19 1030  WBC  --  1.4* 1.3* 5.8 6.0  LATICACIDVEN 4.1* 4.3* 2.0*  --   --     Liver Function Tests: Recent Labs  Lab 10/14/19 2000  AST 54*  ALT 18  ALKPHOS 43  BILITOT 1.3*  PROT 6.9  ALBUMIN 2.7*   No results for input(s): LIPASE, AMYLASE in the last 168 hours. No  results for input(s): AMMONIA in the last 168 hours.  ABG    Component Value Date/Time   PHART 7.470 (H) 10/15/2019 0641   PCO2ART 29.8 (L) 10/15/2019 0641   PO2ART 67.0 (L) 10/15/2019 0641   HCO3 21.9 10/15/2019 0641   TCO2 18 (L) 10/15/2019 1037   ACIDBASEDEF 2.0 10/15/2019 0641   O2SAT 95.0 10/15/2019 0641     Coagulation Profile: Recent Labs  Lab 10/14/19 2030  INR 1.2    Cardiac Enzymes: No results for input(s): CKTOTAL, CKMB, CKMBINDEX, TROPONINI in the last 168 hours.  HbA1C: Hgb A1c MFr Bld  Date/Time Value Ref Range Status  10/16/2019 11:51 PM 5.3 4.8 - 5.6 % Final    Comment:    (NOTE) Pre diabetes:          5.7%-6.4% Diabetes:              >6.4%  Glycemic control for   <7.0% adults with diabetes     CBG: Recent Labs  Lab 10/17/19 1540 10/17/19 1958 10/18/19 0025 10/18/19 0417 10/18/19 0743  GLUCAP 128* 95 102* 123* 131*    Critical care time:    Performed by: Johnsie Cancel   Total critical care time: 37 minutes  Critical care time was exclusive of separately billable procedures and treating other patients.  Critical care was necessary to treat or prevent imminent or life-threatening deterioration.  Critical care was time spent personally by me on the following activities: development of treatment plan with patient and/or surrogate as well as nursing, discussions with consultants, evaluation of patient's response to treatment, examination of patient, obtaining history from patient or surrogate, ordering and performing treatments and interventions, ordering and review of laboratory studies, ordering and review of radiographic studies, pulse oximetry and re-evaluation of patient's condition.  Johnsie Cancel, NP-C [facility] Contact / Pager information can be found on Amion  10/18/2019, 8:34 AM  Attending Note:  43 year old male with developmental delay due to childhood tumor who presents with severe dehydration and  hypernatremia.  Na correcting slowly.  No events overnight.  On exam, he is arousable but not following commands.  I reviewed CXR myself, ETT is in a good position.  Discussed with PCCM-NP.  Brother updated bedside.  Change IVF to D5W.  BMET in AM.  Replace electrolytes.  Wean as able.  PCCM will continue to manage.  The patient is critically ill with multiple organ systems failure and requires high complexity decision making for assessment and support, frequent evaluation and titration of therapies, application of advanced monitoring technologies and extensive interpretation of multiple databases.   Critical Care Time devoted to patient care services described in this note is  32  Minutes. This time reflects time of care of this signee Dr Jennet Maduro. This critical care time does not reflect procedure time, or teaching time or supervisory time of PA/NP/Med student/Med Resident etc but could involve care discussion time.  Rush Farmer, M.D. [facility].

## 2019-10-18 NOTE — Progress Notes (Signed)
[place] Progress Note Patient Name: [name] DOB: May 30, 1976 MRN: [id number]   Date of Service  10/18/2019  HPI/Events of Note  K+ 3.1, he is receiving K+ via NG tube on a schedule.  eICU Interventions  KCL 20 meq iv over 2 hours x 1        Naquisha Whitehair U Majed Pellegrin 10/18/2019, 8:12 PM

## 2019-10-18 NOTE — Progress Notes (Addendum)
[place] Progress Note Patient Name: [name] DOB: 1976/03/08 MRN: [id number]   Date of Service  10/18/2019  HPI/Events of Note  Patient has bradycardia down to 35 when he is asleep. HR improves when he wakes up. BP stabe at 116/79. HR = 43.   eICU Interventions  Will continue to observe for now.  Will consider Scopolamine patch if he continues to become bradycardic into the 30's during sleep.     Intervention Category Major Interventions: Arrhythmia - evaluation and management  Chia Mowers [place] 10/18/2019, 2:11 AM

## 2019-10-18 NOTE — Progress Notes (Signed)
[place] Progress Note Patient Name: [name] DOB: [birth year] MRN: [id number]   Date of Service  10/18/2019  HPI/Events of Note  Agitation - Patient biting on ETT.   eICU Interventions  Will order: 1. Fentanyl 25-50 mcg IV Q 2 hours PRN pain or agitation.      Intervention Category Major Interventions: Delirium, psychosis, severe agitation - evaluation and management  [name] 10/18/2019, 4:07 AM

## 2019-10-18 NOTE — Progress Notes (Signed)
[place] Progress Note Patient Name: [name] DOB: Mar 05, 1976 MRN: [id number]   Date of Service  10/18/2019  HPI/Events of Note  RN reports that left pupil is pinpoint and non-reactive which is a change from baseline  eICU Interventions  Stat CT brain r/o CVA ordered        Frederik Pear 10/18/2019, 11:04 PM

## 2019-10-19 ENCOUNTER — Inpatient Hospital Stay (HOSPITAL_COMMUNITY): Payer: Medicare Other

## 2019-10-19 DIAGNOSIS — G9340 Encephalopathy, unspecified: Secondary | ICD-10-CM

## 2019-10-19 DIAGNOSIS — R4182 Altered mental status, unspecified: Secondary | ICD-10-CM

## 2019-10-19 LAB — CBC
HCT: 22.4 % — ABNORMAL LOW (ref 39.0–52.0)
HCT: 27.8 % — ABNORMAL LOW (ref 39.0–52.0)
Hemoglobin: 7.1 g/dL — ABNORMAL LOW (ref 13.0–17.0)
Hemoglobin: 9.1 g/dL — ABNORMAL LOW (ref 13.0–17.0)
MCH: 31.6 pg (ref 26.0–34.0)
MCH: 31.5 pg (ref 26.0–34.0)
MCHC: 31.7 g/dL (ref 30.0–36.0)
MCHC: 32.7 g/dL (ref 30.0–36.0)
MCV: 99.6 fL (ref 80.0–100.0)
MCV: 96.2 fL (ref 80.0–100.0)
Platelets: 36 10*3/uL — ABNORMAL LOW (ref 150–400)
Platelets: 38 10*3/uL — ABNORMAL LOW (ref 150–400)
RBC: 2.25 MIL/uL — ABNORMAL LOW (ref 4.22–5.81)
RBC: 2.89 MIL/uL — ABNORMAL LOW (ref 4.22–5.81)
RDW: 19.1 % — ABNORMAL HIGH (ref 11.5–15.5)
RDW: 18.2 % — ABNORMAL HIGH (ref 11.5–15.5)
WBC: 5.2 10*3/uL (ref 4.0–10.5)
WBC: 6.2 10*3/uL (ref 4.0–10.5)
nRBC: 0.8 % — ABNORMAL HIGH (ref 0.0–0.2)
nRBC: 0.5 % — ABNORMAL HIGH (ref 0.0–0.2)

## 2019-10-19 LAB — BASIC METABOLIC PANEL
Anion gap: 8 (ref 5–15)
Anion gap: 8 (ref 5–15)
BUN: 45 mg/dL — ABNORMAL HIGH (ref 6–20)
BUN: 42 mg/dL — ABNORMAL HIGH (ref 6–20)
CO2: 19 mmol/L — ABNORMAL LOW (ref 22–32)
CO2: 21 mmol/L — ABNORMAL LOW (ref 22–32)
Calcium: 6.9 mg/dL — ABNORMAL LOW (ref 8.9–10.3)
Calcium: 7.1 mg/dL — ABNORMAL LOW (ref 8.9–10.3)
Chloride: 122 mmol/L — ABNORMAL HIGH (ref 98–111)
Chloride: 120 mmol/L — ABNORMAL HIGH (ref 98–111)
Creatinine, Ser: 0.83 mg/dL (ref 0.61–1.24)
Creatinine, Ser: 1.03 mg/dL (ref 0.61–1.24)
GFR calc Af Amer: >60 mL/min (ref >60)
GFR calc Af Amer: >60 mL/min (ref >60)
GFR calc non Af Amer: >60 mL/min (ref >60)
GFR calc non Af Amer: >60 mL/min (ref >60)
Glucose, Bld: 116 mg/dL — ABNORMAL HIGH (ref 70–99)
Glucose, Bld: 122 mg/dL — ABNORMAL HIGH (ref 70–99)
Potassium: 3.5 mmol/L (ref 3.5–5.1)
Potassium: 4.1 mmol/L (ref 3.5–5.1)
Sodium: 149 mmol/L — ABNORMAL HIGH (ref 135–145)
Sodium: 149 mmol/L — ABNORMAL HIGH (ref 135–145)

## 2019-10-19 LAB — POCT I-STAT 7, (LYTES, BLD GAS, ICA,H+H)
Acid-base deficit: 6 mmol/L — ABNORMAL HIGH (ref 0.0–2.0)
Bicarbonate: 17.8 mmol/L — ABNORMAL LOW (ref 20.0–28.0)
Calcium, Ion: 1.14 mmol/L — ABNORMAL LOW (ref 1.15–1.40)
HCT: 19 % — ABNORMAL LOW (ref 39.0–52.0)
Hemoglobin: 6.5 g/dL — CL (ref 13.0–17.0)
O2 Saturation: 99 %
Patient temperature: 97.5
Potassium: 3.2 mmol/L — ABNORMAL LOW (ref 3.5–5.1)
Sodium: 149 mmol/L — ABNORMAL HIGH (ref 135–145)
TCO2: 19 mmol/L — ABNORMAL LOW (ref 22–32)
pCO2 arterial: 24.8 mmHg — ABNORMAL LOW (ref 32.0–48.0)
pH, Arterial: 7.463 — ABNORMAL HIGH (ref 7.350–7.450)
pO2, Arterial: 103 mmHg (ref 83.0–108.0)

## 2019-10-19 LAB — CULTURE, BLOOD (ROUTINE X 2): Culture: NO GROWTH

## 2019-10-19 LAB — GLUCOSE, CAPILLARY
Glucose-Capillary: 119 mg/dL — ABNORMAL HIGH (ref 70–99)
Glucose-Capillary: 91 mg/dL (ref 70–99)
Glucose-Capillary: 115 mg/dL — ABNORMAL HIGH (ref 70–99)
Glucose-Capillary: 121 mg/dL — ABNORMAL HIGH (ref 70–99)
Glucose-Capillary: 147 mg/dL — ABNORMAL HIGH (ref 70–99)
Glucose-Capillary: 133 mg/dL — ABNORMAL HIGH (ref 70–99)

## 2019-10-19 LAB — PHOSPHORUS: Phosphorus: 1.5 mg/dL — ABNORMAL LOW (ref 2.5–4.6)

## 2019-10-19 LAB — PREPARE RBC (CROSSMATCH)

## 2019-10-19 LAB — MAGNESIUM: Magnesium: 2 mg/dL (ref 1.7–2.4)

## 2019-10-19 LAB — PROTIME-INR
INR: 1.1 (ref 0.8–1.2)
Prothrombin Time: 13.6 seconds (ref 11.4–15.2)

## 2019-10-19 MED ORDER — VALPROIC ACID 250 MG/5ML PO SOLN
500.0000 mg | Freq: Two times a day (BID) | ORAL | Status: DC
  Administered 2019-10-19 – 2019-10-21 (×6): 500 mg
  Filled 2019-10-19 (×7): qty 10

## 2019-10-19 MED ORDER — SODIUM BICARBONATE 8.4 % IV SOLN
50.0000 meq | Freq: Once | INTRAVENOUS | Status: AC
  Administered 2019-10-19: 50 meq via INTRAVENOUS

## 2019-10-19 MED ORDER — SODIUM BICARBONATE 8.4 % IV SOLN
INTRAVENOUS | Status: AC
  Filled 2019-10-19: qty 50

## 2019-10-19 MED ORDER — POTASSIUM & SODIUM PHOSPHATES 280-160-250 MG PO PACK
1.0000 | PACK | Freq: Three times a day (TID) | ORAL | Status: AC
  Administered 2019-10-19 (×3): 1 via ORAL
  Filled 2019-10-19 (×3): qty 1

## 2019-10-19 MED ORDER — POTASSIUM CHLORIDE 20 MEQ/15ML (10%) PO SOLN
40.0000 meq | Freq: Three times a day (TID) | ORAL | Status: AC
  Administered 2019-10-19 (×3): 40 meq via ORAL
  Filled 2019-10-19 (×2): qty 30

## 2019-10-19 MED ORDER — SODIUM CHLORIDE 0.9 % IV SOLN
2.0000 g | INTRAVENOUS | Status: AC
  Administered 2019-10-19 – 2019-10-20 (×2): 2 g via INTRAVENOUS
  Filled 2019-10-19 (×2): qty 20

## 2019-10-19 MED ORDER — CALCIUM GLUCONATE-NACL 1-0.675 GM/50ML-% IV SOLN
1.0000 g | Freq: Once | INTRAVENOUS | Status: AC
  Administered 2019-10-19: 1000 mg via INTRAVENOUS
  Filled 2019-10-19: qty 50

## 2019-10-19 MED ORDER — PANTOPRAZOLE SODIUM 40 MG PO PACK
40.0000 mg | PACK | Freq: Every day | ORAL | Status: DC
  Administered 2019-10-19 – 2019-11-06 (×16): 40 mg
  Filled 2019-10-19 (×18): qty 20

## 2019-10-19 MED ORDER — SODIUM CHLORIDE 0.9% IV SOLUTION: Freq: Once | INTRAVENOUS | Status: AC

## 2019-10-19 MED ORDER — FREE WATER
400.0000 mL | Status: DC
  Administered 2019-10-19 – 2019-10-21 (×13): 400 mL

## 2019-10-19 NOTE — Progress Notes (Signed)
STAT MRI ordered by neurology. Spoke with MRI about patient history of VP shunt and they said that we needed to confirm if VP shunt needed to be reset after MRI. Neurology to speak with MRI about details. Awaiting to hear back.

## 2019-10-19 NOTE — Progress Notes (Signed)
Patient transported to and from CT without complications.  

## 2019-10-19 NOTE — Progress Notes (Signed)
Initial Nutrition Assessment  DOCUMENTATION CODES:   Severe malnutrition in context of social or environmental circumstances, Underweight  INTERVENTION:   Continue TF via OGT:    Vital AF 1.2 at goal rate of 65 ml/h (1560 ml per day)   Provides 1872 kcal, 117 gm protein, 1265 ml free water daily   Continue to monitor magnesium, potassium, and phosphorus, MD to replete as needed, as pt is at risk for refeeding syndrome given severe PCM.  When extubated, will likely need SLP evaluation prior to advancing PO diet.   NUTRITION DIAGNOSIS:   Severe Malnutrition related to social / environmental circumstances(developmental delay) as evidenced by severe fat depletion, severe muscle depletion, percent weight loss(26.5% weight loss within the past year).  GOAL:   Patient will meet greater than or equal to 90% of their needs  MONITOR:   Vent status, TF tolerance, Labs, Skin, Weight trends  REASON FOR ASSESSMENT:   Ventilator, Consult Enteral/tube feeding initiation and management  ASSESSMENT:   43 yo male admitted with septic shock, severe dehydration, severe hypernatremia, suspected source UTI. Recent several days of decreased oral intake, decreased mental status. 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  CT scan revealed slightly decreased size of lateral ventricles, no hemorrhage. Goal for extubation today if able.  OG tube in place. Currently receiving Vital AF 1.2 at 65 ml/h. Free water 400 ml every 4 hours.   Low phos and potassium noted, likely r/t refeeding syndrome. Both are being replaced.  Patient remains intubated on ventilator support MV: 10.5 L/min Temp (24hrs), Avg:97.7 F (36.5 C), Min:97.2 F (36.2 C), Max:98.9 F (37.2 C)  Propofol: none  Labs reviewed. Sodium 149 (H), K 3.2 (L) --> 4.1 WNL, Phos 1.5 (L), BUN 42 (H), creatinine 1.03 (WNL) CBG's: 91-115  Medications reviewed and include KCl,  PHOS-NAK, novolog, levophed, Solu-cortef. IVF: D5 at 75 ml/h  I/O +19.1 L since admission Weight up to 71.3 kg today  Diet Order:   Diet Order            Diet NPO time specified  Diet effective now              EDUCATION NEEDS:   No education needs have been identified at this time  Skin:  Skin Assessment: Skin Integrity Issues: Skin Integrity Issues:: DTI, Stage II DTI: sacrum Stage II: sacrum  Last BM:  12/20 type 5  Height:   Ht Readings from Last 1 Encounters:  10/14/19 5\' 11"  (1.803 m)    Weight:   Wt Readings from Last 1 Encounters:  10/19/19 71.3 kg    Ideal Body Weight:  78.2 kg  BMI:  Body mass index is 21.92 kg/m.  Estimated Nutritional Needs:   Kcal:  1850  Protein:  90-105 gm  Fluid:  >/= 1.8 L    Molli Barrows, RD, LDN, [place] Pager [phone] After Hours Pager [phone]

## 2019-10-19 NOTE — Consult Note (Signed)
Neurology Consultation  Reason for Consult: Abnormal pupil dilatation Referring Physician: Nelda Marseille   History is obtained from: Chart  HPI: [name] is a 43 y.o. male with history of seizures, mental retardation, hypertension, brain tumor with resection who resides at a nursing home.  Patient initially presented to the ED with several days of decreased oral intake, dehydration, decreased mentation and mental status from baseline.  Patient was found to be in septic shock, multiple electrolyte abnormalities,  severe hypernatremia and uremic encephalopathy.  Today it was noted that the patient's left pupil was larger than the right and within a few minutes later when checked on again his left pupil was severely constricted.  Neurology was asked to evaluate patient.  Of note patient does see Dr. Krista Blue for seizures.  His last appointment was on 03/12/2016.  In her note she does note that there is pupillary differences between the right eye and left eye  Past Medical History:  Diagnosis Date  . Brain tumor (benign) ([place])   . Hypertension   . Mental retardation, mild (I.Q. 50-70)   . Seizures ([place])     Family History  Problem Relation Age of Onset  . Diabetes Mother   . Hypertension Mother   . Atrial fibrillation Mother   . Prostate cancer Father   . Multiple sclerosis Sister        Deceased  . Multiple sclerosis Sister        Deceased  . Hypertension Sister     Social History:   reports that he has never smoked. He has never used smokeless tobacco. He reports that he does not drink alcohol or use drugs.  Medications  Current Facility-Administered Medications:  .  0.9 %  sodium chloride infusion, 250 mL, Intravenous, Continuous, Gleason, [name], PA-C, Last Rate: 20 mL/hr at 10/17/19 1753, 250 mL at 10/17/19 1753 .  0.9 %  sodium chloride infusion, 250 mL, Intravenous, Continuous, Oletta Darter Virgina Evener, MD .  artificial tears (LACRILUBE) ophthalmic ointment, , Both Eyes, Q4H PRN,  Merlene Laughter F, NP, Given at 10/18/19 1130 .  cefTRIAXone (ROCEPHIN) 2 g in sodium chloride 0.9 % 100 mL IVPB, 2 g, Intravenous, Q24H, Alfonzo Feller, NP, Stopped at 10/19/19 [phone]  chlorhexidine gluconate (MEDLINE KIT) (PERIDEX) 0.12 % solution 15 mL, 15 mL, Mouth Rinse, BID, Spero Geralds, MD, 15 mL at 10/19/19 0835 .  Chlorhexidine Gluconate Cloth 2 % PADS 6 each, 6 each, Topical, Daily, Spero Geralds, MD, 6 each at 10/19/19 0227 .  dextrose 5 % solution, , Intravenous, Continuous, Rush Farmer, MD, Last Rate: 75 mL/hr at 10/19/19 1000, Rate Verify at 10/19/19 1000 .  feeding supplement (VITAL AF 1.2 CAL) liquid 1,000 mL, 1,000 mL, Per Tube, Continuous, Spero Geralds, MD, Last Rate: 65 mL/hr at 10/19/19 0622, 1,000 mL at 10/19/19 0622 .  fentaNYL (SUBLIMAZE) injection 25-50 mcg, 25-50 mcg, Intravenous, Q2H PRN, Anders Simmonds, MD .  free water 400 mL, 400 mL, Per Tube, Q4H, Alfonzo Feller, NP, 400 mL at 10/19/19 1154 .  hydrocortisone sodium succinate (SOLU-CORTEF) 100 MG injection 50 mg, 50 mg, Intravenous, Q6H, Spero Geralds, MD, 50 mg at 10/19/19 1011 .  influenza vac split quadrivalent PF (FLUARIX) injection 0.5 mL, 0.5 mL, Intramuscular, Tomorrow-1000, Yacoub, Wesam G, MD .  insulin aspart (novoLOG) injection 0-9 Units, 0-9 Units, Subcutaneous, Q4H, Anders Simmonds, MD, 1 Units at 10/18/19 2005 .  insulin aspart (novoLOG) injection 3 Units, 3 Units, Subcutaneous,  Keitha Butte, MD, 3 Units at 10/19/19 1154 .  MEDLINE mouth rinse, 15 mL, Mouth Rinse, 10 times per day, Spero Geralds, MD, 15 mL at 10/19/19 1154 .  norepinephrine (LEVOPHED) 16 mg in 268m premix infusion, 0-70 mcg/min, Intravenous, Titrated, SAnders Simmonds MD, Last Rate: 0.94 mL/hr at 10/19/19 1000, 1 mcg/min at 10/19/19 1000 .  pantoprazole sodium (PROTONIX) 40 mg/20 mL oral suspension 40 mg, 40 mg, Per Tube, Daily, WAlfonzo Feller NP, 40 mg at 10/19/19 1006 .  pneumococcal 23 valent vaccine  (PNEUMOVAX-23) injection 0.5 mL, 0.5 mL, Intramuscular, Tomorrow-1000, Yacoub, Wesam G, MD .  potassium & sodium phosphates (PHOS-NAK) 280-160-250 MG packet 1 packet, 1 packet, Oral, TID WC & HS, WAlfonzo Feller NP, 1 packet at 10/19/19 1154 .  potassium chloride 20 MEQ/15ML (10%) solution 40 mEq, 40 mEq, Oral, TID, WAlfonzo Feller NP, 40 mEq at 10/19/19 1011 .  valproic acid (DEPAKENE) 250 MG/5ML solution 500 mg, 500 mg, Per Tube, BID, WAlfonzo Feller NP, 500 mg at 10/19/19 1011   Exam: Current vital signs: BP (!) 90/58   Pulse 72   Temp (!) 97.2 F (36.2 C) (Oral)   Resp (!) 21   Ht '5\' 11"'$  (1.803 m)   Wt 71.3 kg   SpO2 100%   BMI 21.92 kg/m  Vital signs in last 24 hours: Temp:  [97.2 F (36.2 C)-98.9 F (37.2 C)] 97.2 F (36.2 C) (12/24 1117) Pulse Rate:  [50-107] 72 (12/24 1200) Resp:  [15-26] 21 (12/24 1200) BP: (70-124)/(50-79) 90/58 (12/24 1200) SpO2:  [99 %-100 %] 100 % (12/24 1200) FiO2 (%):  [40 %] 40 % (12/24 1200) Weight:  [71.3 kg] 71.3 kg (12/24 0330)  ROS:  Unable to obtain due to intubation    Physical Exam   Constitutional:  well-nourished.  Eyes: Subconjunctival hemorrhage HENT: Debated Head: Normocephalic.  Cardiovascular: Normal rate and regular rhythm.  Respiratory: Debated but breathing over the vent GI: Soft.   Skin: WDI  Neuro: Mental Status: Intubated and winces to pain only Cranial Nerves: II: no blink to threat  III,IV, VI: Right pupil 235mnon reactive and left pupil 19m26meactive V: Facial sensation is winces symmetrically to noxious stimuli VII: Face winces symmetrically, noxious stimuli.  VIII: No response to voice X: Intubated XII: Intubated Motor: Bilateral upper and lower extremity contraction Sensory: Winces to noxious stimuli in all 4 extremities Deep Tendon Reflexes: 2+ and symmetric in the biceps and patellae.  Plantars: Downgoing on the right upgoing on the left  Labs I have reviewed labs in epic and the  results pertinent to this consultation are:   CBC    Component Value Date/Time   WBC 5.2 10/19/2019 0327   RBC 2.25 (L) 10/19/2019 0327   HGB 6.5 (LL) 10/19/2019 0429   HGB 12.7 03/12/2016 1030   HCT 19.0 (L) 10/19/2019 0429   HCT 38.9 03/12/2016 1030   PLT 36 (L) 10/19/2019 0327   PLT 188 03/12/2016 1030   MCV 99.6 10/19/2019 0327   MCV 91 03/12/2016 1030   MCH 31.6 10/19/2019 0327   MCHC 31.7 10/19/2019 0327   RDW 19.1 (H) 10/19/2019 0327   RDW 14.8 03/12/2016 1030   LYMPHSABS 0.9 10/14/2019 2000   MONOABS 0.2 10/14/2019 2000   EOSABS 0.0 10/14/2019 2000   BASOSABS 0.0 10/14/2019 2000    CMP     Component Value Date/Time   NA 149 (H) 10/19/2019 1101   NA 140 03/12/2016 1030  K 4.1 10/19/2019 1101   CL 120 (H) 10/19/2019 1101   CO2 21 (L) 10/19/2019 1101   GLUCOSE 122 (H) 10/19/2019 1101   BUN 42 (H) 10/19/2019 1101   BUN 8 03/12/2016 1030   CREATININE 1.03 10/19/2019 1101   CALCIUM 7.1 (L) 10/19/2019 1101   PROT 6.9 10/14/2019 2000   PROT 7.6 03/12/2016 1030   ALBUMIN 2.7 (L) 10/14/2019 2000   ALBUMIN 4.5 03/12/2016 1030   AST 54 (H) 10/14/2019 2000   ALT 18 10/14/2019 2000   ALKPHOS 43 10/14/2019 2000   BILITOT 1.3 (H) 10/14/2019 2000   BILITOT 0.3 03/12/2016 1030   GFRNONAA >60 10/19/2019 1101   GFRAA >60 10/19/2019 1101    Lipid Panel     Component Value Date/Time   CHOL 181 05/04/2008 2044   TRIG 103 05/04/2008 2044   HDL 47 05/04/2008 2044   CHOLHDL 3.9 Ratio 05/04/2008 2044   VLDL 21 05/04/2008 2044   LDLCALC 113 (H) 05/04/2008 2044     Imaging I have reviewed the images obtained:  CT-scan of the brain-slightly decreased size of lateral ventricles with mild leftward deviation of septum pellucidum.  No intracranial hemorrhage    Etta Quill PA-C [facility] [phone]  M-F  (9:00 am- 5:00 PM)  10/19/2019, 12:06 PM     Assessment:  Is a 43 year old male presenting to the hospital secondary to sepsis, dehydration and  multiple electrolyte abnormalities.  Patient noted to have left pupil abnormalities which appear improved.  He is significantly more confused than baseline, one concern would be if he has developed ischemic events, with the pupillary change being related to a transient event, in the setting of sepsis.  I do think that an MRI given that he is not at his baseline is a reasonable thing to pursue.  I discussed with neurosurgery and the on-call physician will need to be called once the MRI has been completed to reprogram the shunt.  It is certainly possible that his current mental status is simply secondary to his general medical condition.  Recommendations: -MRI brain - eeg -Neurology will follow  Roland Rack, MD [facility] [phone]  If 7pm- 7am, please page neurology on call as listed in [place].

## 2019-10-19 NOTE — Progress Notes (Addendum)
NAME:  [name], MRN:  [id number], DOB:  [birth year], LOS: 5 ADMISSION DATE:  10/14/2019,  CHIEF COMPLAINT: Altered mental status  Brief History   Patient is a 43 year old gentleman with a childhood brain tumor and developmental delay as well as seizures disorder who lives in a nursing home.  He presents with several days of decreased oral intake dehydration and decreased mentation and mental status from baseline.  Was admitted early the morning of 12/19 found to be in septic shock, severe dehydration with severe hypernatremia, with suspected source to be UTI.  He was intubated for worsening respiratory acidosis and clinical coma.   Consults:  None  Procedures:  12/20 intubation 12/20 L subclavian central line 12/20 influenza negative  Significant Diagnostic Tests:  12/24 CT brain Slightly decreased size of the lateral ventricles with mild leftward deviation of the septum pellucidum. No intracranial hemorrhage.  Micro Data:  [id number] 12/20 > test negative, this was PCRx2 Urine culture 12/20 > E. Coli, resistant to Amoicillin, Trimeth/Sulfa, and Ampicillin/Sulbactam  Blood culture 12/19 >   Antimicrobials:  12/20 cefepime >  12/20 Flagyl x1 12/20 vancomycin > 12/21  Interim history/subjective:  Pupillary changes, left pinpoint pupil nonreactive overnight prompted CT scan which revealed slightly decreased size of the lateral ventricles with mild leftward deviation of septum pellucidum.  No hemorrhage. Appears back to baseline this am. On low dose levophed.   Objective   Blood pressure 99/60, pulse 72, temperature (!) 97.5 F (36.4 C), temperature source Axillary, resp. rate 20, height 5\' 11"  (1.803 m), weight 71.3 kg, SpO2 100 %. CVP:  [8 mmHg-9 mmHg] 8 mmHg  Vent Mode: PRVC FiO2 (%):  [40 %] 40 % Set Rate:  [16 bmp] 16 bmp Vt Set:  [600 mL] 600 mL PEEP:  [5 cmH20] 5 cmH20 Pressure Support:  [10 cmH20] 10 cmH20 Plateau Pressure:  [13 cmH20-18 cmH20] 18 cmH20    Intake/Output Summary (Last 24 hours) at 10/19/2019 0729 Last data filed at 10/19/2019 0600 Gross per 24 hour  Intake 8665.36 ml  Output 1325 ml  Net 7340.36 ml   Filed Weights   10/17/19 0437 10/18/19 0425 10/19/19 0330  Weight: 60.1 kg 65.6 kg 71.3 kg    Examination: General: Chronically ill-appearing adult male, looking around,  NAD HENT: Normocephalic, PERRL-sluggish.  Injections noted.  Moist mucus membranes Neck: No JVD. Trachea midline.  CV: RRR. S1S2. No MRG. +2 radial pulses. +1 DP/PT pulses Lungs: BBS managed at bases, faint rhonchi upper lobes, FNL, symmetrical.  On full vent support. ABD: +BS x4. SNT/ND. No masses, guarding or rigidity GU: Condom cath in place EXT: Upper extremities mildly contracted.  2+ pedal edema Skin: PWD.  Anterior surfaces in tact. No rashes or lesions Neuro: Eyes open spontaneously, looking around.  Does not follow commands or move extremities purposely     Resolved problems :  Acute kidney injury, likely prerenal with component of intrinsic from UTI  Assessment & Plan:  Patient is a 43 year old gentleman with a history of developmental delay who is bedbound and lives at home with his family.  Acute hypoxemic respiratory failure - intubated secondary, to clinical coma  Plan:  Will attempt SBT today with goal for extubation.  Concerned that continued mental status changes and severe deconditioning/bedbound at baseline may preclude safe extubation Continue ventilator support with lung protective strategies  Wean PEEP and FiO2 for sats greater than 90%. Head of bed elevated 30 degrees. Plateau pressures less than 30 cm H20.  Ensure adequate pulmonary  hygiene  VAP bundle in place  PAD protocol  Acute uremic encephalopathy -In the setting of sepsis and severe electrolyte derangements  -Uremia is improving daily -pupillary changes overnight. CT head as above. Appears transient  Plan: Continue to correct electrolytes  Continue  delirium precautions  He is not requiring sedation Neuro checks If pupillary changes or other mental status changes will consult neuro. Suspect event overnight were r/t  to decreased left-sided pressure in the presence of craniectomy defect.  Septic shock secondary to likely UTI On low-dose Levophed Plan: Pressor support improving, continue to wean  Continue IV antibiotics-change to ceftriaxone to complete a total of 7-day course of cephalosporins for E. Coli.  See discussion below Continue enteral hydration Continue stress dose steroids until need for pressor support resolves   Multiple electrolyte abnormalities including:  Hypocalcemia, hypokalemia, hypernatremia, hyperchloremia Plan: Continue to trend levels q8hrs  Supplement as needed  Increase free water flushes to 400 cc every 4 hours  Deep tissue injury present on admission Not visualized on today's exam Plan: Continue frequent wound care  Q2hr turns  WOC    Severe protein calorie malnutrition Plan: Continue tube feeds  Dietitian following Monitor for refeeding syndrome   Thrombo-cytopenia present on admission Likely due to septic shock.  2 points on 4T score with low probability of HIT. Plan:  DC heparin as patient is also anemic Continue to treat shock He is on various medications that can contribute to thrombocytopenia including cephalosporin, valproic acid.  Will change cefazolin to ceftriaxone slightly less effect on platelets Monitor with CBC, signs and symptoms of bleeding Consider platelet transfusion for less than 20,000  Anemia.  No overt signs of bleeding however he has had a fairly precipitous drop in hemoglobin and hematocrit over past 72 hours.   -type and screen and give 1 unit of blood.  -Hemoccult stool -check coags   Best practice:  Diet: Tube feeds Pain/Anxiety/Delirium protocol (if indicated): PRN fentanyl  VAP protocol (if indicated): Ordered DVT prophylaxis: SCDs only for now  GI  prophylaxis: On PPI  Glucose control: Monitor Mobility: Bedrest Code Status: Full code Family Communication: will update  Disposition: Continue ICU  Labs and Imaging past 24 hours reviewed in EMR   Chest xray 12/24 personally reviewed persistent but mild bilateral lower lobe infiltrates.  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  CBC: Recent Labs  Lab 10/14/19 2000 10/14/19 2200 10/15/19 0331 10/15/19 1037 10/16/19 0350 10/17/19 1030 10/19/19 0327 10/19/19 0429  WBC 2.3* 1.4* 1.3*  --  5.8 6.0 5.2  --   NEUTROABS 1.2*  --   --   --   --   --   --   --   HGB 12.7* 11.1* 9.7* 9.9* 11.8* 8.9* 7.1* 6.5*  HCT 44.6 38.3* 33.3* 29.0* 37.5* 29.0* 22.4* 19.0*  MCV 111.5* 109.7* 107.1*  --  100.0 103.6* 99.6  --   PLT 53* 35* 37*  --  47* 41* 36*  --     Basic Metabolic Panel: Recent Labs  Lab 10/16/19 1015 10/16/19 1751 10/16/19 2351 10/17/19 1030 10/17/19 1744 10/17/19 2201 10/18/19 0418 10/18/19 0850 10/18/19 1816 10/19/19 0327 10/19/19 0429  NA 164* 161* 160* 156* 154* 151* 153* 153* 151* 149* 149*  K 4.3 3.2* 3.0* 2.5* 4.1 2.6* 3.3* 3.2* 3.1* 3.5 3.2*  CL >130* >130* >130* 127* 127* 124* 125* 125* 124* 122*  --   CO2 16* 17* 16* 18* 18* 18* 20* 18*  18* 19*  --   GLUCOSE 143* 246* 187* 170* 122* 134* 141* 160* 146* 116*  --   BUN 90* 87* 83* 70* 66* 66* 61* 56* 50* 45*  --   CREATININE 2.55* 2.57* 2.01* 1.59* 1.41* 1.23 1.21 1.24 1.03 0.83  --   CALCIUM 7.6* 7.4* 7.2* 7.0* 7.0* 6.8* 7.0* 6.8* 6.9* 6.9*  --   MG 2.7* 2.5* 2.4  --  2.2  --   --   --   --  2.0  --   PHOS 3.7 2.4* 2.4* 2.4* 1.7*  --   --   --   --  1.5*  --    GFR: Estimated Creatinine Clearance: 116.9 mL/min (by C-G formula based on SCr of 0.83 mg/dL). Recent Labs  Lab 10/14/19 2030 10/14/19 2200 10/15/19 0331 10/16/19 0350 10/17/19 1030 10/19/19 0327  WBC  --  1.4* 1.3* 5.8 6.0 5.2  LATICACIDVEN 4.1* 4.3* 2.0*  --   --   --      Liver Function Tests: Recent Labs  Lab 10/14/19 2000  AST 54*  ALT 18  ALKPHOS 43  BILITOT 1.3*  PROT 6.9  ALBUMIN 2.7*   No results for input(s): LIPASE, AMYLASE in the last 168 hours. No results for input(s): AMMONIA in the last 168 hours.  ABG    Component Value Date/Time   PHART 7.463 (H) 10/19/2019 0429   PCO2ART 24.8 (L) 10/19/2019 0429   PO2ART 103.0 10/19/2019 0429   HCO3 17.8 (L) 10/19/2019 0429   TCO2 19 (L) 10/19/2019 0429   ACIDBASEDEF 6.0 (H) 10/19/2019 0429   O2SAT 99.0 10/19/2019 0429     Coagulation Profile: Recent Labs  Lab 10/14/19 2030  INR 1.2    CBG: Recent Labs  Lab 10/18/19 1115 10/18/19 1501 10/18/19 1955 10/18/19 2328 10/19/19 0328  GLUCAP 151* 139* 132* 96 119*      The patient is critically ill with aspiratory failure and shock. He requires ongoing ICU for high complexity decision making, titration of high alert medications, ventilator management, titration of oxygen and interpretation of advanced monitoring.    I personally spent 45 minutes providing critical care services including personally reviewing test results, discussing care with nursing staff/other physicians and completing orders pertaining to this patient.  Time was exclusive to the patient and does not include time spent teaching or in procedures.  Voice recognition software was used in the production of this record.  Errors in interpretation may have been inadvertently missed during review.  Francine Graven, MSN, AGACNP  [facility]  Attending Note:  43 year old male with PMH of a brain tumor who presents with aspiration and respiratory failure.  Overnight, had a blown pupil that is now resolved on exam with clear lungs.  I reviewed CXR myself, ETT is in a good position and head CT without acute abnormalities.  Discussed with PCCM-NP.  Will begin weaning trials.  If neuro status changes again then will call neurology.  Minimize sedation as able.   Transfuse one unit pRBC today.  Send peripheral smear.  PCCM will continue to manage.  The patient is critically ill with multiple organ systems failure and requires high complexity decision making for assessment and support, frequent evaluation and titration of therapies, application of advanced monitoring technologies and extensive interpretation of multiple databases.   Critical Care Time devoted to patient care services described in this note is  32  Minutes. This time reflects time of care of this signee Dr  Jennet Maduro. This critical care time does not reflect procedure time, or teaching time or supervisory time of PA/NP/Med student/Med Resident etc but could involve care discussion time.  Rush Farmer, M.D. [facility] Pulmonary/Critical Care Medicine.

## 2019-10-20 ENCOUNTER — Inpatient Hospital Stay (HOSPITAL_COMMUNITY): Payer: Medicare Other

## 2019-10-20 LAB — RENAL FUNCTION PANEL
Albumin: 1.3 g/dL — ABNORMAL LOW (ref 3.5–5.0)
Anion gap: 8 (ref 5–15)
BUN: 35 mg/dL — ABNORMAL HIGH (ref 6–20)
CO2: 21 mmol/L — ABNORMAL LOW (ref 22–32)
Calcium: 7.1 mg/dL — ABNORMAL LOW (ref 8.9–10.3)
Chloride: 119 mmol/L — ABNORMAL HIGH (ref 98–111)
Creatinine, Ser: 0.74 mg/dL (ref 0.61–1.24)
GFR calc Af Amer: >60 mL/min (ref >60)
GFR calc non Af Amer: >60 mL/min (ref >60)
Glucose, Bld: 142 mg/dL — ABNORMAL HIGH (ref 70–99)
Phosphorus: 1.4 mg/dL — ABNORMAL LOW (ref 2.5–4.6)
Potassium: 3.8 mmol/L (ref 3.5–5.1)
Sodium: 148 mmol/L — ABNORMAL HIGH (ref 135–145)

## 2019-10-20 LAB — CBC
HCT: 25.2 % — ABNORMAL LOW (ref 39.0–52.0)
Hemoglobin: 8.1 g/dL — ABNORMAL LOW (ref 13.0–17.0)
MCH: 30.9 pg (ref 26.0–34.0)
MCHC: 32.1 g/dL (ref 30.0–36.0)
MCV: 96.2 fL (ref 80.0–100.0)
Platelets: 38 10*3/uL — ABNORMAL LOW (ref 150–400)
RBC: 2.62 MIL/uL — ABNORMAL LOW (ref 4.22–5.81)
RDW: 19.1 % — ABNORMAL HIGH (ref 11.5–15.5)
WBC: 6.2 10*3/uL (ref 4.0–10.5)
nRBC: 0.8 % — ABNORMAL HIGH (ref 0.0–0.2)

## 2019-10-20 LAB — TYPE AND SCREEN
ABO/RH(D): O POS
Antibody Screen: NEGATIVE
Unit division: 0

## 2019-10-20 LAB — BPAM RBC
Blood Product Expiration Date: 202101242359
ISSUE DATE / TIME: 202012241049
Unit Type and Rh: 5100

## 2019-10-20 LAB — GLUCOSE, CAPILLARY
Glucose-Capillary: 139 mg/dL — ABNORMAL HIGH (ref 70–99)
Glucose-Capillary: 125 mg/dL — ABNORMAL HIGH (ref 70–99)
Glucose-Capillary: 117 mg/dL — ABNORMAL HIGH (ref 70–99)
Glucose-Capillary: 121 mg/dL — ABNORMAL HIGH (ref 70–99)
Glucose-Capillary: 135 mg/dL — ABNORMAL HIGH (ref 70–99)

## 2019-10-20 LAB — CULTURE, BLOOD (ROUTINE X 2): Culture: NO GROWTH

## 2019-10-20 LAB — MAGNESIUM: Magnesium: 1.9 mg/dL (ref 1.7–2.4)

## 2019-10-20 MED ORDER — K PHOS MONO-SOD PHOS DI & MONO 155-852-130 MG PO TABS
250.0000 mg | ORAL_TABLET | Freq: Two times a day (BID) | ORAL | Status: AC
  Administered 2019-10-20 (×2): 250 mg
  Filled 2019-10-20 (×2): qty 1

## 2019-10-20 NOTE — Progress Notes (Addendum)
NAME:  NGHIA MASSER, MRN:  [id number], DOB:  [birth year], LOS: 6 ADMISSION DATE:  10/14/2019,  CHIEF COMPLAINT: Altered mental status  Brief History   Patient is a 43 year old gentleman with a childhood brain tumor and developmental delay as well as seizures disorder who lives in a nursing home.  He presents with several days of decreased oral intake dehydration and decreased mentation and mental status from baseline.  Was admitted early the morning of 12/19 found to be in septic shock, severe dehydration with severe hypernatremia, with suspected source to be UTI.  He was intubated for worsening respiratory acidosis and clinical coma.   Consults:  None  Procedures:  12/20 intubation 12/20 L subclavian central line 12/20 influenza negative  Significant Diagnostic Tests:  12/24 CT brain Slightly decreased size of the lateral ventricles with mild leftward deviation of the septum pellucidum. No intracranial hemorrhage.  Micro Data:  [id number] 12/20 > test negative, this was PCRx2 Urine culture 12/20 > E. Coli, resistant to Amoicillin, Trimeth/Sulfa, and Ampicillin/Sulbactam  Blood culture 12/19 > NGTD  Antimicrobials:  12/20 cefepime >  12/20 Flagyl x1 12/20 vancomycin > 12/21  Interim history/subjective:  Off NE this morning Preparing to go for MRI   Objective   Blood pressure 106/71, pulse 68, temperature (!) 97.5 F (36.4 C), temperature source Axillary, resp. rate 16, height 5\' 11"  (1.803 m), weight 71.3 kg, SpO2 100 %.    Vent Mode: PRVC FiO2 (%):  [40 %] 40 % Set Rate:  [16 bmp] 16 bmp Vt Set:  [600 mL] 600 mL PEEP:  [5 cmH20] 5 cmH20 Pressure Support:  [5 cmH20] 5 cmH20 Plateau Pressure:  [17 cmH20-23 cmH20] 18 cmH20   Intake/Output Summary (Last 24 hours) at 10/20/2019 0835 Last data filed at 10/20/2019 0830 Gross per 24 hour  Intake 6408.43 ml  Output 1625 ml  Net 4783.43 ml   Filed Weights   10/17/19 0437 10/18/19 0425 10/19/19 0330  Weight: 60.1 kg  65.6 kg 71.3 kg    Examination: General: chronically ill adult M, intubated NAD  HENT: Injected sclera. NCAT. ETT secure. Trachea midline  CV: RRR s1s2 no rgm cap refill < 3 seconds  Lungs: diminished bibasilar sounds. No adventitious sounds. Mechanical vent sounds noted.  ABD: soft round ndnt + bowel sounds  GU: condom cath  EXT: Mild chronically contracted BUE. Low muscle mass BLE  Skin: c/d/w without rash. Pressure injury dressed  Neuro: Awake, looking around. Does not follow commands    Resolved problems :  Acute kidney injury, likely prerenal with component of intrinsic from UTI  Assessment & Plan:  Patient is a 43 year old gentleman with a history of developmental delay who is bedbound and lives at home with his family.  Acute hypoxemic respiratory failure requiring intubation  - intubated secondary to clinical coma Plan: Remains intubated and requiring vent support Favor remaining intubated until after MRI   Wean PEEP and FiO2 for sats greater than 90%. Head of bed elevated 30 degrees. VAP bundle in place  PAD protocol  Acute uremic encephalopathy -In the setting of sepsis and severe electrolyte derangements  -Uremia is improving daily -pupillary changes overnight. CT head as above. Appears transient  Plan: Continue to correct electrolytes  Continue delirium precautions  He is not requiring sedation Neuro checks   Neurology consulted due to uneven pupils. (This is suspected to be r/t decreased L sided pressure in setting of craniectomy defect) MRI -- on call NSGY will need to be paged after MRI  to reset shunt  EEG  Septic shock secondary to likely UTI On low-dose Levophed Plan: Weaning pressors, off 12/25  Continue ceftriaxone for 7 day course MAP goal > 36 NE weaned off, if remains off will begin weaning stress dose steroids   Multiple electrolyte abnormalities including:  Hypocalcemia, hypokalemia, hypernatremia, hyperchloremia, hypophosphatemia   Plan: Continue to trend levels q8hrs  Supplement as needed  Increase free water flushes to 400 cc every 4 hours  Deep tissue injury present on admission Plan: WOC    Severe protein calorie malnutrition Plan: EN per RDN Monitor for refeeding syndrome   Thrombocytopenia present on admission Likely due to septic shock.  2 points on 4T score with low probability of HIT. He is on various medications that can contribute to thrombocytopenia including cephalosporin, valproic acid.  Plan:  Cefazolin changed to ceftriaxone as is less impactful on plt  No indication for plt transfusion at this time   Anemia.  No overt signs of bleeding however he has had a fairly precipitous drop in hemoglobin and hematocrit over past 72 hours.   Plan Transfuse per unit protocol  Hyperglycemia Plan SSI  Best practice:  Diet: Tube feeds Pain/Anxiety/Delirium protocol (if indicated): PRN fentanyl  VAP protocol (if indicated): Ordered DVT prophylaxis: SCDs only for now  GI prophylaxis: On PPI  Glucose control: SSI Mobility: Bedrest Code Status: Full code Family Communication: pending 12/25  Disposition: Continue ICU  Labs and Imaging past 24 hours reviewed in EMR    CBC: Recent Labs  Lab 10/14/19 2000 10/14/19 2011 10/16/19 0350 10/17/19 1030 10/19/19 0327 10/19/19 0429 10/19/19 1539 10/20/19 0357  WBC 2.3*   < > 5.8 6.0 5.2  --  6.2 6.2  NEUTROABS 1.2*  --   --   --   --   --   --   --   HGB 12.7*  --  11.8* 8.9* 7.1* 6.5* 9.1* 8.1*  HCT 44.6  --  37.5* 29.0* 22.4* 19.0* 27.8* 25.2*  MCV 111.5*   < > 100.0 103.6* 99.6  --  96.2 96.2  PLT 53*   < > 47* 41* 36*  --  38* 38*   < > = values in this interval not displayed.    Basic Metabolic Panel: Recent Labs  Lab 10/16/19 1751 10/16/19 2351 10/17/19 1030 10/17/19 1744 10/18/19 0850 10/18/19 1816 10/19/19 0327 10/19/19 0429 10/19/19 1101 10/20/19 0357  NA 161* 160* 156* 154* 153* 151* 149* 149* 149* 148*  K 3.2* 3.0*  2.5* 4.1 3.2* 3.1* 3.5 3.2* 4.1 3.8  CL >130* >130* 127* 127* 125* 124* 122*  --  120* 119*  CO2 17* 16* 18* 18* 18* 18* 19*  --  21* 21*  GLUCOSE 246* 187* 170* 122* 160* 146* 116*  --  122* 142*  BUN 87* 83* 70* 66* 56* 50* 45*  --  42* 35*  CREATININE 2.57* 2.01* 1.59* 1.41* 1.24 1.03 0.83  --  1.03 0.74  CALCIUM 7.4* 7.2* 7.0* 7.0* 6.8* 6.9* 6.9*  --  7.1* 7.1*  MG 2.5* 2.4  --  2.2  --   --  2.0  --   --  1.9  PHOS 2.4* 2.4* 2.4* 1.7*  --   --  1.5*  --   --  1.4*   GFR: Estimated Creatinine Clearance: 121.3 mL/min (by C-G formula based on SCr of 0.74 mg/dL). Recent Labs  Lab 10/14/19 2030 10/14/19 2200 10/15/19 0331 10/17/19 1030 10/19/19 0327 10/19/19 1539 10/20/19 0357  WBC  --  1.4* 1.3* 6.0 5.2 6.2 6.2  LATICACIDVEN 4.1* 4.3* 2.0*  --   --   --   --     Liver Function Tests: Recent Labs  Lab 10/14/19 2000 10/20/19 0357  AST 54*  --   ALT 18  --   ALKPHOS 43  --   BILITOT 1.3*  --   PROT 6.9  --   ALBUMIN 2.7* 1.3*   No results for input(s): LIPASE, AMYLASE in the last 168 hours. No results for input(s): AMMONIA in the last 168 hours.  ABG    Component Value Date/Time   PHART 7.463 (H) 10/19/2019 0429   PCO2ART 24.8 (L) 10/19/2019 0429   PO2ART 103.0 10/19/2019 0429   HCO3 17.8 (L) 10/19/2019 0429   TCO2 19 (L) 10/19/2019 0429   ACIDBASEDEF 6.0 (H) 10/19/2019 0429   O2SAT 99.0 10/19/2019 0429     Coagulation Profile: Recent Labs  Lab 10/14/19 2030 10/19/19 0837  INR 1.2 1.1    CBG: Recent Labs  Lab 10/19/19 1453 10/19/19 2004 10/19/19 2323 10/20/19 0337 10/20/19 0740  GLUCAP 121* 147* 133* 139* 125*   CRITICAL CARE Performed by: Cristal Generous   Total critical care time: 35 minutes  Critical care time was exclusive of separately billable procedures and treating other patients. Critical care was necessary to treat or prevent imminent or life-threatening deterioration.  Critical care was time spent personally by me on the following  activities: development of treatment plan with patient and/or surrogate as well as nursing, discussions with consultants, evaluation of patient's response to treatment, examination of patient, obtaining history from patient or surrogate, ordering and performing treatments and interventions, ordering and review of laboratory studies, ordering and review of radiographic studies, pulse oximetry and re-evaluation of patient's condition.  Eliseo Gum MSN, AGACNP-BC [place] [id number] If no answer, [id number] 10/20/2019, 8:36 AM  Attending Note:  43 year old male with history of childhood brain tumor s/p resection with shunt placement.  Yesterday patient had uneven pupils.  MRI was ordered and is to be done today.  Patient remains poorly responsive but pupils are not uneven this AM on exam with clear lungs.  I reviewed CXR myself, ETT is in a good position.  Discussed with PCCM-NP.  Will f/u post MRI.  NSG will be asked to review.  Continue weaning efforts.  Family wishes for full code status.  PCCM will continue to manage.  The patient is critically ill with multiple organ systems failure and requires high complexity decision making for assessment and support, frequent evaluation and titration of therapies, application of advanced monitoring technologies and extensive interpretation of multiple databases.   Critical Care Time devoted to patient care services described in this note is  31  Minutes. This time reflects time of care of this signee Dr Jennet Maduro. This critical care time does not reflect procedure time, or teaching time or supervisory time of PA/NP/Med student/Med Resident etc but could involve care discussion time.  Rush Farmer, M.D. [facility] Pulmonary/Critical Care Medicine

## 2019-10-20 NOTE — Progress Notes (Signed)
Spoke with neurosurgery yesterday, on call will need to be paged after MRI to reset shunt.   Roland Rack, MD [facility] [phone]  If 7pm- 7am, please page neurology on call as listed in [place].

## 2019-10-20 NOTE — Progress Notes (Signed)
Subjective: No significant changes  Exam: Vitals:   10/20/19 0821 10/20/19 1135  BP: 106/71 111/63  Pulse: 68 67  Resp: 16 16  Temp:    SpO2: 100% 99%   Gen: In bed, NAD Resp: non-labored breathing, no acute distress Abd: soft, nt  Neuro: MS: Awake, eyes open, regards examiner but does not follow commands. CN: Right pupil 4 mm and reactive, left pupil 2 mm and reactive, eyes are disconjugate, he does not fully look in either direction but he does orient eyes to noxious stimulation to some degree. 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  He has been encephalopathic and 2 nights ago had a pupillary change reported as a large left pupil which is of unclear etiology at this time.  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  Also possible would be that some of this is secondary to  white matter disease due to hypertension.  Low suspicion for shunt malfunction.  Recommendations: 1) EEG, likely be done tomorrow 2) if negative then I would continue treating his underlying  Roland Rack, MD [facility] [phone]  If 7pm- 7am, please page neurology on call as listed in [place].

## 2019-10-20 NOTE — Plan of Care (Signed)

## 2019-10-21 ENCOUNTER — Inpatient Hospital Stay (HOSPITAL_COMMUNITY): Payer: Medicare Other

## 2019-10-21 DIAGNOSIS — J810 Acute pulmonary edema: Secondary | ICD-10-CM

## 2019-10-21 LAB — RENAL FUNCTION PANEL
Albumin: 1.4 g/dL — ABNORMAL LOW (ref 3.5–5.0)
Anion gap: 6 (ref 5–15)
BUN: 28 mg/dL — ABNORMAL HIGH (ref 6–20)
CO2: 24 mmol/L (ref 22–32)
Calcium: 7.2 mg/dL — ABNORMAL LOW (ref 8.9–10.3)
Chloride: 114 mmol/L — ABNORMAL HIGH (ref 98–111)
Creatinine, Ser: 0.65 mg/dL (ref 0.61–1.24)
GFR calc Af Amer: >60 mL/min (ref >60)
GFR calc non Af Amer: >60 mL/min (ref >60)
Glucose, Bld: 116 mg/dL — ABNORMAL HIGH (ref 70–99)
Phosphorus: 1.8 mg/dL — ABNORMAL LOW (ref 2.5–4.6)
Potassium: 3.2 mmol/L — ABNORMAL LOW (ref 3.5–5.1)
Sodium: 144 mmol/L (ref 135–145)

## 2019-10-21 LAB — GLUCOSE, CAPILLARY
Glucose-Capillary: 106 mg/dL — ABNORMAL HIGH (ref 70–99)
Glucose-Capillary: 111 mg/dL — ABNORMAL HIGH (ref 70–99)
Glucose-Capillary: 113 mg/dL — ABNORMAL HIGH (ref 70–99)
Glucose-Capillary: 121 mg/dL — ABNORMAL HIGH (ref 70–99)
Glucose-Capillary: 131 mg/dL — ABNORMAL HIGH (ref 70–99)
Glucose-Capillary: 99 mg/dL (ref 70–99)

## 2019-10-21 LAB — CBC
HCT: 25 % — ABNORMAL LOW (ref 39.0–52.0)
Hemoglobin: 8.3 g/dL — ABNORMAL LOW (ref 13.0–17.0)
MCH: 32 pg (ref 26.0–34.0)
MCHC: 33.2 g/dL (ref 30.0–36.0)
MCV: 96.5 fL (ref 80.0–100.0)
Platelets: 50 10*3/uL — ABNORMAL LOW (ref 150–400)
RBC: 2.59 MIL/uL — ABNORMAL LOW (ref 4.22–5.81)
RDW: 18.5 % — ABNORMAL HIGH (ref 11.5–15.5)
WBC: 7.4 10*3/uL (ref 4.0–10.5)
nRBC: 0.3 % — ABNORMAL HIGH (ref 0.0–0.2)

## 2019-10-21 LAB — POCT I-STAT 7, (LYTES, BLD GAS, ICA,H+H)
Acid-base deficit: 2 mmol/L (ref 0.0–2.0)
Bicarbonate: 22 mmol/L (ref 20.0–28.0)
Calcium, Ion: 1.18 mmol/L (ref 1.15–1.40)
HCT: 22 % — ABNORMAL LOW (ref 39.0–52.0)
Hemoglobin: 7.5 g/dL — ABNORMAL LOW (ref 13.0–17.0)
O2 Saturation: 98 %
Potassium: 3.1 mmol/L — ABNORMAL LOW (ref 3.5–5.1)
Sodium: 144 mmol/L (ref 135–145)
TCO2: 23 mmol/L (ref 22–32)
pCO2 arterial: 31.1 mmHg — ABNORMAL LOW (ref 32.0–48.0)
pH, Arterial: 7.458 — ABNORMAL HIGH (ref 7.350–7.450)
pO2, Arterial: 106 mmHg (ref 83.0–108.0)

## 2019-10-21 LAB — MAGNESIUM: Magnesium: 1.9 mg/dL (ref 1.7–2.4)

## 2019-10-21 MED ORDER — FREE WATER
250.0000 mL | Status: DC
  Administered 2019-10-21 – 2019-10-22 (×5): 250 mL

## 2019-10-21 MED ORDER — POTASSIUM CHLORIDE 10 MEQ/50ML IV SOLN
10.0000 meq | INTRAVENOUS | Status: AC
  Administered 2019-10-21 (×4): 10 meq via INTRAVENOUS
  Filled 2019-10-21: qty 50

## 2019-10-21 MED ORDER — FREE WATER: 200.0000 mL | Status: DC

## 2019-10-21 MED ORDER — FUROSEMIDE 10 MG/ML IJ SOLN
20.0000 mg | Freq: Four times a day (QID) | INTRAMUSCULAR | Status: AC
  Administered 2019-10-21 (×3): 20 mg via INTRAVENOUS
  Filled 2019-10-21 (×3): qty 2

## 2019-10-21 MED ORDER — HYDROCORTISONE NA SUCCINATE PF 100 MG IJ SOLR
50.0000 mg | Freq: Two times a day (BID) | INTRAMUSCULAR | Status: DC
  Administered 2019-10-21 – 2019-10-23 (×4): 50 mg via INTRAVENOUS
  Filled 2019-10-21 (×4): qty 2

## 2019-10-21 MED ORDER — POTASSIUM PHOSPHATES 15 MMOLE/5ML IV SOLN
20.0000 mmol | Freq: Once | INTRAVENOUS | Status: AC
  Administered 2019-10-21: 20 mmol via INTRAVENOUS
  Filled 2019-10-21: qty 6.67

## 2019-10-21 NOTE — Progress Notes (Addendum)
NAME:  [name], MRN:  [id number], DOB:  [birth year], LOS: 7 ADMISSION DATE:  10/14/2019,  CHIEF COMPLAINT: Altered mental status  Brief History   Patient is a 43 year old gentleman with a childhood brain tumor and developmental delay as well as seizures disorder who lives in a nursing home.  He presents with several days of decreased oral intake dehydration and decreased mentation and mental status from baseline.  Was admitted early the morning of 12/19 found to be in septic shock, severe dehydration with severe hypernatremia, with suspected source to be UTI.  He was intubated for worsening respiratory acidosis and clinical coma.   Consults:  None  Procedures:  12/20 intubation 12/20 L subclavian central line 12/20 influenza negative  Significant Diagnostic Tests:  12/24 CT brain Slightly decreased size of the lateral ventricles with mild leftward deviation of the septum pellucidum. No intracranial hemorrhage.  Micro Data:  [id number] 12/20 > test negative, this was PCRx2 Urine culture 12/20 > E. Coli, resistant to Amoicillin, Trimeth/Sulfa, and Ampicillin/Sulbactam  Blood culture 12/19 > negative  Antimicrobials:  12/20 cefepime > off Ceftriaxone 10/20/2019>> 12/20 Flagyl x1 12/20 vancomycin > 12/21  Interim history/subjective:  No pressor support.  Awake and follows commands.  Objective   Blood pressure 118/71, pulse 68, temperature 97.8 F (36.6 C), temperature source Axillary, resp. rate 17, height 5\' 11"  (1.803 m), weight 77.2 kg, SpO2 100 %.    Vent Mode: PSV;CPAP FiO2 (%):  [40 %] 40 % Set Rate:  [16 bmp] 16 bmp Vt Set:  [600 mL] 600 mL PEEP:  [5 cmH20] 5 cmH20 Pressure Support:  [10 cmH20] 10 cmH20 Plateau Pressure:  [18 cmH20-22 cmH20] 18 cmH20   Intake/Output Summary (Last 24 hours) at 10/21/2019 1017 Last data filed at 10/21/2019 0600 Gross per 24 hour  Intake 4760.9 ml  Output 2150 ml  Net 2610.9 ml   Filed Weights   10/18/19 0425 10/19/19  0330 10/21/19 0445  Weight: 65.6 kg 71.3 kg 77.2 kg    Examination: General: 42-year-old male with obvious mental retardation HEENT: Endotracheal tube gastric tube in place Neuro: Follows commands weakly CV: Heart sounds regular regular rate and rhythm PULM: Decreased in the bases Vent pressure support ventilation 10 cm with a tidal volume of 600 FIO2 40% PEEP 5 RATE spontaneous rate of 14 VT spontaneous tidal volume of 460  GI: soft, bsx4 active  GU: Extremities: warm/dry,  edema  Skin: no rashes or lesions    Resolved problems :  Acute kidney injury, likely prerenal with component of intrinsic from UTI  Assessment & Plan:  Patient is a 43 year old gentleman with a history of developmental delay who is bedbound and lives at home with his family.  Acute hypoxemic respiratory failure requiring intubation  - intubated secondary to clinical coma Plan:  10/21/2019 awake and alert on pressure support ventilation Not sure what is normal mental baseline is.  He follows commands weakly I suspect he is close to his mental baseline. Wean per protocol Give consideration to extubation note due to his weakness multiple comorbidities may require reintubation   encephalopathy with underlying mental retardation status post brain surgery complicated by Sepsis.  The patient is been reactivated per neurosurgery Plan: Correct electrolyte Continue to monitor mental status with the patient No sedation  Septic shock secondary to likely UTI with E. coli  Plan: Currently off pressors Continue ceftriaxone for total 7 days E. coli sensitive  Multiple   abnormalities including:  Hypocalcemia, hypokalemia, hypernatremia, hyperchloremia, hypophosphatemia  Recent Labs  Lab 10/20/19 0357 10/21/19 0321 10/21/19 0336  K 3.8 3.1* 3.2*   Recent Labs  Lab 10/20/19 0357 10/21/19 0321 10/21/19 0336  NA 148* 144 144     Plan: Monitor replete as needed Decrease free water 20 mmol of  K-Phos IV 10/21/2011  Deep tissue injury present on admission Plan:  Continue wound ostomy care  Severe protein calorie malnutrition Plan: Enteral tube feeding for return to edition Monitor for refeeding syndrome  Thrombocytopenia present on admission Likely due to septic shock.  2 points on 4T score with low probability of HIT. He is on various medications that can contribute to thrombocytopenia including cephalosporin, valproic acid.   Plan:  Currently on Keppra Plts 38->50  Anemia.  No overt signs of bleeding however he has had a fairly precipitous drop in hemoglobin and hematocrit over past 72 hours.   Recent Labs    10/21/19 0321 10/21/19 0336  HGB 7.5* 8.3*    Plan Transfuse per protocol   Hyperglycemia CBG (last 3)  Recent Labs    10/21/19 0023 10/21/19 0425 10/21/19 0758  GLUCAP 111* 113* 106*    Plan Sliding scale insulin per protocol  Best practice:  Diet: Tube feeds Pain/Anxiety/Delirium protocol (if indicated): PRN fentanyl  VAP protocol (if indicated): Ordered DVT prophylaxis: SCDs only for now  GI prophylaxis: On PPI  Glucose control: SSI Mobility: Bedrest Code Status: Full code Family Communication: 10/21/2019 no family at bedside Disposition: Continue ICU  Labs and Imaging past 24 hours reviewed in EMR    CBC: Recent Labs  Lab 10/14/19 2000 10/14/19 2011 10/17/19 1030 10/19/19 0327 10/19/19 0429 10/19/19 1539 10/20/19 0357 10/21/19 0321 10/21/19 0336  WBC 2.3*   < > 6.0 5.2  --  6.2 6.2  --  7.4  NEUTROABS 1.2*  --   --   --   --   --   --   --   --   HGB 12.7*  --  8.9* 7.1* 6.5* 9.1* 8.1* 7.5* 8.3*  HCT 44.6  --  29.0* 22.4* 19.0* 27.8* 25.2* 22.0* 25.0*  MCV 111.5*   < > 103.6* 99.6  --  96.2 96.2  --  96.5  PLT 53*   < > 41* 36*  --  38* 38*  --  50*   < > = values in this interval not displayed.    Basic Metabolic Panel: Recent Labs  Lab 10/16/19 2351 10/17/19 1030 10/17/19 1744 10/18/19 1816 10/19/19 0327  10/19/19 0429 10/19/19 1101 10/20/19 0357 10/21/19 0321 10/21/19 0336  NA 160* 156* 154* 151* 149* 149* 149* 148* 144 144  K 3.0* 2.5* 4.1 3.1* 3.5 3.2* 4.1 3.8 3.1* 3.2*  CL >130* 127* 127* 124* 122*  --  120* 119*  --  114*  CO2 16* 18* 18* 18* 19*  --  21* 21*  --  24  GLUCOSE 187* 170* 122* 146* 116*  --  122* 142*  --  116*  BUN 83* 70* 66* 50* 45*  --  42* 35*  --  28*  CREATININE 2.01* 1.59* 1.41* 1.03 0.83  --  1.03 0.74  --  0.65  CALCIUM 7.2* 7.0* 7.0* 6.9* 6.9*  --  7.1* 7.1*  --  7.2*  MG 2.4  --  2.2  --  2.0  --   --  1.9  --  1.9  PHOS 2.4* 2.4* 1.7*  --  1.5*  --   --  1.4*  --  1.8*  GFR: Estimated Creatinine Clearance: 128.1 mL/min (by C-G formula based on SCr of 0.65 mg/dL). Recent Labs  Lab 10/14/19 2030 10/14/19 2200 10/15/19 0331 10/19/19 0327 10/19/19 1539 10/20/19 0357 10/21/19 0336  WBC  --  1.4* 1.3* 5.2 6.2 6.2 7.4  LATICACIDVEN 4.1* 4.3* 2.0*  --   --   --   --     Liver Function Tests: Recent Labs  Lab 10/14/19 2000 10/20/19 0357 10/21/19 0336  AST 54*  --   --   ALT 18  --   --   ALKPHOS 43  --   --   BILITOT 1.3*  --   --   PROT 6.9  --   --   ALBUMIN 2.7* 1.3* 1.4*   No results for input(s): LIPASE, AMYLASE in the last 168 hours. No results for input(s): AMMONIA in the last 168 hours.  ABG    Component Value Date/Time   PHART 7.458 (H) 10/21/2019 0321   PCO2ART 31.1 (L) 10/21/2019 0321   PO2ART 106.0 10/21/2019 0321   HCO3 22.0 10/21/2019 0321   TCO2 23 10/21/2019 0321   ACIDBASEDEF 2.0 10/21/2019 0321   O2SAT 98.0 10/21/2019 0321     Coagulation Profile: Recent Labs  Lab 10/14/19 2030 10/19/19 0837  INR 1.2 1.1    CBG: Recent Labs  Lab 10/20/19 1549 10/20/19 2012 10/21/19 0023 10/21/19 0425 10/21/19 0758  GLUCAP 121* 135* 111* 113* 106*   CRITICAL CARE Performed by: William Minor   Total critical care time: 30 minutes  Richardson Landry Minor ACNP Acute Care Nurse Practitioner [place] Please consult [place] 10/21/2019, 10:17 AM  Attending Note:  43 year old male with severe developmental delays due to a childhood brain tumor that was intubated due to inability to protect his airway.  Overnight, no events, weaning this AM.  On exam, weaning on high PS with coarse BS.  I reviewed CXR myself, ETT is in a good position.  Discussed with PCCM-NP.  Will d/c dextrose.  Low dose diureses.  Aggressive K prelacement.  KVO IVF.  Anticipate will extubate in AM despite of mental status as baseline is poor to start with.  PCCM will continue to manage.  The patient is critically ill with multiple organ systems failure and requires high complexity decision making for assessment and support, frequent evaluation and titration of therapies, application of advanced monitoring technologies and extensive interpretation of multiple databases.   Critical Care Time devoted to patient care services described in this note is  33  Minutes. This time reflects time of care of this signee Dr Jennet Maduro. This critical care time does not reflect procedure time, or teaching time or supervisory time of PA/NP/Med student/Med Resident etc but could involve care discussion time.  Rush Farmer, M.D. [facility] Pulmonary/Critical Care Medicine.

## 2019-10-21 NOTE — Progress Notes (Signed)
Subjective: No significant changes  Exam: Vitals:   10/21/19 1100 10/21/19 1144  BP: (!) 102/56   Pulse: 60   Resp: 16   Temp:  98.5 F (36.9 C)  SpO2: 100% 100%   Gen: In bed, NAD Resp: non-labored breathing, no acute distress Abd: soft, nt  Neuro: MS: Awake, eyes open, regards examiner but does not follow commands. CN: Right pupil 4 mm and reactive, left pupil 2 mm and reactive, eyes are disconjugate, he does not fully look in either direction but he does orient eyes to noxious stimulation to some degree. 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  He has been encephalopathic and 3 nights ago had a pupillary change reported as a large left pupil which is of unclear etiology at this time.  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  Also possible would be that some of this is secondary to  white matter disease due to hypertension.  Low suspicion for shunt malfunction.  I think his encephalopathy is likely due to his general medical condition superimposed on a poor prodrome.   Recommendations: 1) EEG 2) if negative then I would continue treating his underlying conditions.   Roland Rack, MD [facility] [phone]  If 7pm- 7am, please page neurology on call as listed in [place].

## 2019-10-21 NOTE — Progress Notes (Signed)
EEG complete - results pending 

## 2019-10-22 ENCOUNTER — Inpatient Hospital Stay (HOSPITAL_COMMUNITY): Payer: Medicare Other

## 2019-10-22 LAB — POCT I-STAT 7, (LYTES, BLD GAS, ICA,H+H)
Acid-Base Excess: 4 mmol/L — ABNORMAL HIGH (ref 0.0–2.0)
Bicarbonate: 27 mmol/L (ref 20.0–28.0)
Calcium, Ion: 1.18 mmol/L (ref 1.15–1.40)
HCT: 23 % — ABNORMAL LOW (ref 39.0–52.0)
Hemoglobin: 7.8 g/dL — ABNORMAL LOW (ref 13.0–17.0)
O2 Saturation: 99 %
Potassium: 3.2 mmol/L — ABNORMAL LOW (ref 3.5–5.1)
Sodium: 145 mmol/L (ref 135–145)
TCO2: 28 mmol/L (ref 22–32)
pCO2 arterial: 33.1 mmHg (ref 32.0–48.0)
pH, Arterial: 7.519 — ABNORMAL HIGH (ref 7.350–7.450)
pO2, Arterial: 128 mmHg — ABNORMAL HIGH (ref 83.0–108.0)

## 2019-10-22 LAB — CBC WITH DIFFERENTIAL/PLATELET
Abs Immature Granulocytes: 1.08 10*3/uL — ABNORMAL HIGH (ref 0.00–0.07)
Basophils Absolute: 0.1 10*3/uL (ref 0.0–0.1)
Basophils Relative: 1 %
Eosinophils Absolute: 0 10*3/uL (ref 0.0–0.5)
Eosinophils Relative: 0 %
HCT: 25.3 % — ABNORMAL LOW (ref 39.0–52.0)
Hemoglobin: 8.4 g/dL — ABNORMAL LOW (ref 13.0–17.0)
Immature Granulocytes: 12 %
Lymphocytes Relative: 16 %
Lymphs Abs: 1.4 10*3/uL (ref 0.7–4.0)
MCH: 31.5 pg (ref 26.0–34.0)
MCHC: 33.2 g/dL (ref 30.0–36.0)
MCV: 94.8 fL (ref 80.0–100.0)
Monocytes Absolute: 1.7 10*3/uL — ABNORMAL HIGH (ref 0.1–1.0)
Monocytes Relative: 19 %
Neutro Abs: 4.6 10*3/uL (ref 1.7–7.7)
Neutrophils Relative %: 52 %
Platelets: 83 10*3/uL — ABNORMAL LOW (ref 150–400)
RBC: 2.67 MIL/uL — ABNORMAL LOW (ref 4.22–5.81)
RDW: 17.9 % — ABNORMAL HIGH (ref 11.5–15.5)
WBC: 8.8 10*3/uL (ref 4.0–10.5)
nRBC: 0.5 % — ABNORMAL HIGH (ref 0.0–0.2)

## 2019-10-22 LAB — RENAL FUNCTION PANEL
Albumin: 1.5 g/dL — ABNORMAL LOW (ref 3.5–5.0)
Anion gap: 8 (ref 5–15)
BUN: 25 mg/dL — ABNORMAL HIGH (ref 6–20)
CO2: 26 mmol/L (ref 22–32)
Calcium: 7.6 mg/dL — ABNORMAL LOW (ref 8.9–10.3)
Chloride: 112 mmol/L — ABNORMAL HIGH (ref 98–111)
Creatinine, Ser: 0.59 mg/dL — ABNORMAL LOW (ref 0.61–1.24)
GFR calc Af Amer: >60 mL/min (ref >60)
GFR calc non Af Amer: >60 mL/min (ref >60)
Glucose, Bld: 101 mg/dL — ABNORMAL HIGH (ref 70–99)
Phosphorus: 2.7 mg/dL (ref 2.5–4.6)
Potassium: 3.3 mmol/L — ABNORMAL LOW (ref 3.5–5.1)
Sodium: 146 mmol/L — ABNORMAL HIGH (ref 135–145)

## 2019-10-22 LAB — GLUCOSE, CAPILLARY
Glucose-Capillary: 100 mg/dL — ABNORMAL HIGH (ref 70–99)
Glucose-Capillary: 122 mg/dL — ABNORMAL HIGH (ref 70–99)
Glucose-Capillary: 130 mg/dL — ABNORMAL HIGH (ref 70–99)
Glucose-Capillary: 56 mg/dL — ABNORMAL LOW (ref 70–99)
Glucose-Capillary: 63 mg/dL — ABNORMAL LOW (ref 70–99)
Glucose-Capillary: 70 mg/dL (ref 70–99)
Glucose-Capillary: 81 mg/dL (ref 70–99)
Glucose-Capillary: 87 mg/dL (ref 70–99)
Glucose-Capillary: 89 mg/dL (ref 70–99)
Glucose-Capillary: 93 mg/dL (ref 70–99)

## 2019-10-22 LAB — MAGNESIUM: Magnesium: 1.8 mg/dL (ref 1.7–2.4)

## 2019-10-22 MED ORDER — POTASSIUM PHOSPHATES 15 MMOLE/5ML IV SOLN
30.0000 mmol | Freq: Once | INTRAVENOUS | Status: AC
  Administered 2019-10-22: 30 mmol via INTRAVENOUS
  Filled 2019-10-22: qty 10

## 2019-10-22 MED ORDER — VALPROATE SODIUM 500 MG/5ML IV SOLN
500.0000 mg | Freq: Two times a day (BID) | INTRAVENOUS | Status: DC
  Administered 2019-10-22 – 2019-10-28 (×12): 500 mg via INTRAVENOUS
  Filled 2019-10-22 (×14): qty 5

## 2019-10-22 MED ORDER — DEXTROSE 50 % IV SOLN
INTRAVENOUS | Status: AC
  Administered 2019-10-22: 50 mL via INTRAVENOUS
  Filled 2019-10-22: qty 50

## 2019-10-22 MED ORDER — FREE WATER
250.0000 mL | Status: DC
  Administered 2019-10-23 – 2019-11-06 (×68): 250 mL

## 2019-10-22 MED ORDER — DEXTROSE 50 % IV SOLN
INTRAVENOUS | Status: AC
  Administered 2019-10-22: 12.5 g via INTRAVENOUS
  Filled 2019-10-22: qty 50

## 2019-10-22 MED ORDER — DEXTROSE 10 % IV SOLN: INTRAVENOUS | Status: DC

## 2019-10-22 MED ORDER — POTASSIUM CHLORIDE 20 MEQ/15ML (10%) PO SOLN
30.0000 meq | ORAL | Status: DC
  Administered 2019-10-22: 30 meq
  Filled 2019-10-22: qty 30

## 2019-10-22 MED ORDER — DEXTROSE 50 % IV SOLN: 1.0000 | Freq: Once | INTRAVENOUS | Status: AC

## 2019-10-22 MED ORDER — MAGNESIUM SULFATE 2 GM/50ML IV SOLN
2.0000 g | Freq: Once | INTRAVENOUS | Status: AC
  Administered 2019-10-22: 2 g via INTRAVENOUS
  Filled 2019-10-22: qty 50

## 2019-10-22 MED ORDER — DEXTROSE 50 % IV SOLN: 12.5000 g | Freq: Once | INTRAVENOUS | Status: AC

## 2019-10-22 NOTE — Plan of Care (Signed)

## 2019-10-22 NOTE — Progress Notes (Signed)
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  He has been encephalopathic and 3 nights ago had a pupillary change reported as a large left pupil which is of unclear etiology at this time.  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  Also possible would be that some of this is secondary to  white matter disease due to hypertension.  Low suspicion for shunt malfunction.  I think his encephalopathy is likely due to his general medical condition superimposed on a poor prodrome, and is improving.  EEG was negative, and no further recommendations other than treating his underlying medical conditions at this time.  Recommendations: 1) please call with further questions or concerns.  Roland Rack, MD [facility] [phone]  If 7pm- 7am, please page neurology on call as listed in [place].

## 2019-10-22 NOTE — Progress Notes (Signed)
.  [facility] ADULT ICU REPLACEMENT PROTOCOL FOR AM LAB REPLACEMENT ONLY  The patient does apply for the [facility] Adult ICU Electrolyte Replacment Protocol based on the criteria listed below:   1. Is GFR >/= 40 ml/min? Yes.    Patient's GFR today is >60 2. Is urine output >/= 0.5 ml/kg/hr for the last 6 hours? Yes.   Patient's UOP is 5.0 ml/kg/hr 3. Is BUN < 60 mg/dL? Yes.    Patient's BUN today is 25 4. Abnormal electrolyte(s): K+3.3 Mag 1.8 5. Ordered repletion with: protocol 6. If a panic level lab has been reported, has the CCM MD in charge been notified? Yes.  .   Physician:  Dr. Raliegh Scarlet, Talbot Grumbling 10/22/2019 5:32 AM

## 2019-10-22 NOTE — Procedures (Addendum)
History: 43 yo M being evaluated for AMS  Sedation: None  Technique: This is a 21 channel routine scalp EEG performed at the bedside with bipolar and monopolar montages arranged in accordance to the international 10/20 system of electrode placement. One channel was dedicated to EKG recording.    Background: The background is obscured during most of the recording by myogenic artifact. That being said, there is some background visible at times with a low voltage posterior dominant rhythm of 8 Hz seen briefly. There is some generalized irregular low voltage delta range activity intruding into the background as well. There is no rhythmic or epileptiform appearing activity, wither during the periods of quiescence or underneath the muscle. No sleep was seen.   Photic stimulation: Physiologic driving is none   EEG Abnormalities: 1) Generalized irregular slow activity.  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 Though limited by muscle artifact, no seizure or seizure predisposition recorded on this study. Please note that lack of epileptiform activity on EEG does not preclude the possibility of epilepsy.   Roland Rack, MD [facility] [phone]  If 7pm- 7am, please page neurology on call as listed in [place].

## 2019-10-22 NOTE — Progress Notes (Addendum)
[place] Progress Note Patient Name: [name] DOB: 06/09/1976 MRN: [id number]   Date of Service  10/22/2019  HPI/Events of Note  NPO, feeds stopped, has had hypoglycemia on 25 cc/hour of D10  eICU Interventions  Increase to 50 cc/hour Check glucose q2h  Call if still low      Intervention Category Major Interventions: Hyperglycemia - active titration of insulin therapy  Nichollas Perusse G Janelle Spellman 10/22/2019, 8:59 PM   12.20 am  Glucose 70 Increase d10 to 100 cc/hour

## 2019-10-22 NOTE — Procedures (Signed)
Extubation Procedure Note  Patient Details:   Name: COLLIS FLEMING DOB: [birth year] MRN: [id number]   Airway Documentation:    Vent end date: 10/22/19 Vent end time: 0923   Evaluation  O2 sats: stable throughout Complications: No apparent complications Patient did tolerate procedure well. Bilateral Breath Sounds: Clear, Diminished   Pt extubated per MD order Pt had +cuff leak  Placed on 4L [place] tolerating well Pt following commands & has strong productive cough  Ciro Backer 10/22/2019, 9:27 AM

## 2019-10-22 NOTE — Progress Notes (Signed)
NAME:  [name], MRN:  [id number], DOB:  12/20/1975, LOS: 35 ADMISSION DATE:  10/14/2019,  CHIEF COMPLAINT: Altered mental status  Brief History   Patient is a 43 year old gentleman with a childhood brain tumor and developmental delay as well as seizures disorder who lives in a nursing home.  He presents with several days of decreased oral intake dehydration and decreased mentation and mental status from baseline.  Was admitted early the morning of 12/19 found to be in septic shock, severe dehydration with severe hypernatremia, with suspected source to be UTI.  He was intubated for worsening respiratory acidosis and clinical coma.   Consults:  None  Procedures:  12/20 intubation 12/20 L subclavian central line 12/20 influenza negative  Significant Diagnostic Tests:  12/24 CT brain Slightly decreased size of the lateral ventricles with mild leftward deviation of the septum pellucidum. No intracranial hemorrhage.  Micro Data:  [id number] 12/20 > test negative, this was PCRx2 Urine culture 12/20 > E. Coli, resistant to Amoicillin, Trimeth/Sulfa, and Ampicillin/Sulbactam  Blood culture 12/19 > negative  Antimicrobials:  12/20 cefepime > off Ceftriaxone 10/20/2019>> 12/20 Flagyl x1 12/20 vancomycin > 12/21  Interim history/subjective:  No pressor support.  Awake and follows commands.  Objective   Blood pressure 129/70, pulse 78, temperature 98.1 F (36.7 C), temperature source Axillary, resp. rate 20, height 5\' 11"  (1.803 m), weight 77.2 kg, SpO2 100 %.    Vent Mode: PSV;CPAP FiO2 (%):  [30 %-40 %] 30 % Set Rate:  [14 bmp-16 bmp] 14 bmp Vt Set:  [520 mL-600 mL] 520 mL PEEP:  [5 cmH20] 5 cmH20 Pressure Support:  [5 cmH20] 5 cmH20 Plateau Pressure:  [18 cmH20-22 cmH20] 20 cmH20   Intake/Output Summary (Last 24 hours) at 10/22/2019 0831 Last data filed at 10/22/2019 0600 Gross per 24 hour  Intake 4307.22 ml  Output 5400 ml  Net -1092.78 ml   Filed Weights   10/18/19 0425 10/19/19 0330 10/21/19 0445  Weight: 65.6 kg 71.3 kg 77.2 kg    Examination: General: 43 year old male with mental delay HEENT: [place]/AT, pupils reactions are variable, EOM-spontaneous and MMM, ETT in place Neuro: Does not follow commands CV: RRR, Nl S1/S2 and -M/R/G PULM: Diminished diffusely GI: Soft, NT, ND and +BS Extremities: warm/dry,  edema  Skin: no rashes or lesions  I reviewed CXR, ETT is in a good position  Discussed with bedside RN and RT  Resolved problems :  Acute kidney injury, likely prerenal with component of intrinsic from UTI  Assessment & Plan:  Patient is a 43 year old gentleman with a history of developmental delay who is bedbound and lives at home with his family.  Acute hypoxemic respiratory failure requiring intubation  - intubated secondary to clinical coma Plan: Not sure what is normal mental baseline is.  He follows commands weakly I suspect he is close to his mental baseline. Wean per protocol Trial extubation prior to commitment to trach or discussion of a trach   encephalopathy with underlying mental retardation status post brain surgery complicated by Sepsis.  The patient is been reactivated per neurosurgery Plan: Correct electrolyte Continue to monitor mental status with the patient No sedation Neurology following  Septic shock secondary to likely UTI with E. coli  Plan: Currently off pressors Continue ceftriaxone for total 7 days E. coli sensitive  Multiple   abnormalities including:  Hypocalcemia, hypokalemia, hypernatremia, hyperchloremia, hypophosphatemia  Recent Labs  Lab 10/21/19 0336 10/22/19 0339 10/22/19 0418  K 3.2* 3.2* 3.3*   Recent Labs  Lab 10/21/19 0336 10/22/19 0339 10/22/19 0418  NA 144 145 146*     Plan: Monitor replete as needed Free water to 250 q4 30 mmol of K-Phos IV Mag 2 gm  Deep tissue injury present on admission Plan:  Continue wound ostomy care  Severe protein calorie  malnutrition Plan: TF per nutrition  Monitor for refeeding syndrome  Thrombocytopenia present on admission Likely due to septic shock.  2 points on 4T score with low probability of HIT. He is on various medications that can contribute to thrombocytopenia including cephalosporin, valproic acid.   Plan:  Currently on Keppra Plts 38->50->83  Anemia.  No overt signs of bleeding however he has had a fairly precipitous drop in hemoglobin and hematocrit over past 72 hours.   Recent Labs    10/22/19 0339 10/22/19 0418  HGB 7.8* 8.4*   Plan CBC in AM Transfuse per protocol  Hyperglycemia CBG (last 3)  Recent Labs    10/22/19 0006 10/22/19 0437 10/22/19 0800  GLUCAP 122* 89 81    Plan CBG and ISS  Best practice:  Diet: Tube feeds Pain/Anxiety/Delirium protocol (if indicated): PRN fentanyl  VAP protocol (if indicated): Ordered DVT prophylaxis: SCDs only for now  GI prophylaxis: On PPI  Glucose control: SSI Mobility: Bedrest Code Status: Full code Family Communication: 10/21/2019 no family at bedside Disposition: Continue ICU  Labs and Imaging past 24 hours reviewed in EMR    CBC: Recent Labs  Lab 10/19/19 0327 10/19/19 1539 10/20/19 0357 10/21/19 0321 10/21/19 0336 10/22/19 0339 10/22/19 0418  WBC 5.2 6.2 6.2  --  7.4  --  8.8  NEUTROABS  --   --   --   --   --   --  4.6  HGB 7.1* 9.1* 8.1* 7.5* 8.3* 7.8* 8.4*  HCT 22.4* 27.8* 25.2* 22.0* 25.0* 23.0* 25.3*  MCV 99.6 96.2 96.2  --  96.5  --  94.8  PLT 36* 38* 38*  --  50*  --  83*    Basic Metabolic Panel: Recent Labs  Lab 10/17/19 1744 10/19/19 0327 10/19/19 1101 10/20/19 0357 10/21/19 0321 10/21/19 0336 10/22/19 0339 10/22/19 0418  NA 154* 149* 149* 148* 144 144 145 146*  K 4.1 3.5 4.1 3.8 3.1* 3.2* 3.2* 3.3*  CL 127* 122* 120* 119*  --  114*  --  112*  CO2 18* 19* 21* 21*  --  24  --  26  GLUCOSE 122* 116* 122* 142*  --  116*  --  101*  BUN 66* 45* 42* 35*  --  28*  --  25*  CREATININE  1.41* 0.83 1.03 0.74  --  0.65  --  0.59*  CALCIUM 7.0* 6.9* 7.1* 7.1*  --  7.2*  --  7.6*  MG 2.2 2.0  --  1.9  --  1.9  --  1.8  PHOS 1.7* 1.5*  --  1.4*  --  1.8*  --  2.7   GFR: Estimated Creatinine Clearance: 128.1 mL/min (A) (by C-G formula based on SCr of 0.59 mg/dL (L)). Recent Labs  Lab 10/19/19 1539 10/20/19 0357 10/21/19 0336 10/22/19 0418  WBC 6.2 6.2 7.4 8.8    Liver Function Tests: Recent Labs  Lab 10/20/19 0357 10/21/19 0336 10/22/19 0418  ALBUMIN 1.3* 1.4* 1.5*   No results for input(s): LIPASE, AMYLASE in the last 168 hours. No results for input(s): AMMONIA in the last 168 hours.  ABG    Component Value Date/Time   PHART 7.519 (H)  10/22/2019 0339   PCO2ART 33.1 10/22/2019 0339   PO2ART 128.0 (H) 10/22/2019 0339   HCO3 27.0 10/22/2019 0339   TCO2 28 10/22/2019 0339   ACIDBASEDEF 2.0 10/21/2019 0321   O2SAT 99.0 10/22/2019 0339     Coagulation Profile: Recent Labs  Lab 10/19/19 0837  INR 1.1    CBG: Recent Labs  Lab 10/21/19 1604 10/21/19 2006 10/22/19 0006 10/22/19 0437 10/22/19 0800  GLUCAP 99 121* 122* 89 81   The patient is critically ill with multiple organ systems failure and requires high complexity decision making for assessment and support, frequent evaluation and titration of therapies, application of advanced monitoring technologies and extensive interpretation of multiple databases.   Critical Care Time devoted to patient care services described in this note is  34  Minutes. This time reflects time of care of this signee Dr Jennet Maduro. This critical care time does not reflect procedure time, or teaching time or supervisory time of PA/NP/Med student/Med Resident etc but could involve care discussion time.  Rush Farmer, M.D. [facility] Pulmonary/Critical Care Medicine.

## 2019-10-22 NOTE — Progress Notes (Signed)
Hypoglycemic Event  CBG: 1209 CBG 56  Treatment: D50 50 mL (25 gm)  Symptoms: None  Follow-up CBG: Time:1302 CBG Result:130  Possible Reasons for Event: Tube feeds stopped  Comments/MD notified:Dr. Nelda Marseille 1amp of D50 and D10 84ml/hr ordered.      Irven [place]

## 2019-10-22 NOTE — Progress Notes (Signed)
[place] Progress Note Patient Name: ESTIBEN VLAD DOB: March 28, 1976 MRN: [id number]   Date of Service  10/22/2019  HPI/Events of Note  ABG: 7.51/33/120/600  eICU Interventions  Tidal volume reduced to 539 (7 ml/kg) and RR reduced to 14        Marquavis Hannen U Howie Rufus 10/22/2019, 4:49 AM

## 2019-10-22 NOTE — Progress Notes (Signed)
[place] Progress Note Patient Name: KENA CRIST DOB: 1976/02/16 MRN: [id number]   Date of Service  10/22/2019  HPI/Events of Note  RN pointed out patient is due for oral depakote but does not have a NG tube post extubation   eICU Interventions  Switch to IV form per pharmacy      Intervention Category Minor Interventions: Routine modifications to care plan (e.g. PRN medications for pain, fever)  Margaretmary Lombard 10/22/2019, 10:43 PM

## 2019-10-23 ENCOUNTER — Inpatient Hospital Stay (HOSPITAL_COMMUNITY): Payer: Medicare Other

## 2019-10-23 LAB — CBC
HCT: 24.5 % — ABNORMAL LOW (ref 39.0–52.0)
Hemoglobin: 8 g/dL — ABNORMAL LOW (ref 13.0–17.0)
MCH: 31.4 pg (ref 26.0–34.0)
MCHC: 32.7 g/dL (ref 30.0–36.0)
MCV: 96.1 fL (ref 80.0–100.0)
Platelets: 126 10*3/uL — ABNORMAL LOW (ref 150–400)
RBC: 2.55 MIL/uL — ABNORMAL LOW (ref 4.22–5.81)
RDW: 17.4 % — ABNORMAL HIGH (ref 11.5–15.5)
WBC: 7 10*3/uL (ref 4.0–10.5)
nRBC: 0.3 % — ABNORMAL HIGH (ref 0.0–0.2)

## 2019-10-23 LAB — BASIC METABOLIC PANEL
Anion gap: 6 (ref 5–15)
BUN: 18 mg/dL (ref 6–20)
CO2: 28 mmol/L (ref 22–32)
Calcium: 7.5 mg/dL — ABNORMAL LOW (ref 8.9–10.3)
Chloride: 111 mmol/L (ref 98–111)
Creatinine, Ser: 0.49 mg/dL — ABNORMAL LOW (ref 0.61–1.24)
GFR calc Af Amer: >60 mL/min (ref >60)
GFR calc non Af Amer: >60 mL/min (ref >60)
Glucose, Bld: 130 mg/dL — ABNORMAL HIGH (ref 70–99)
Potassium: 3.6 mmol/L (ref 3.5–5.1)
Sodium: 145 mmol/L (ref 135–145)

## 2019-10-23 LAB — GLUCOSE, CAPILLARY
Glucose-Capillary: 103 mg/dL — ABNORMAL HIGH (ref 70–99)
Glucose-Capillary: 107 mg/dL — ABNORMAL HIGH (ref 70–99)
Glucose-Capillary: 109 mg/dL — ABNORMAL HIGH (ref 70–99)
Glucose-Capillary: 110 mg/dL — ABNORMAL HIGH (ref 70–99)
Glucose-Capillary: 110 mg/dL — ABNORMAL HIGH (ref 70–99)
Glucose-Capillary: 136 mg/dL — ABNORMAL HIGH (ref 70–99)
Glucose-Capillary: 59 mg/dL — ABNORMAL LOW (ref 70–99)
Glucose-Capillary: 62 mg/dL — ABNORMAL LOW (ref 70–99)
Glucose-Capillary: 83 mg/dL (ref 70–99)
Glucose-Capillary: 88 mg/dL (ref 70–99)

## 2019-10-23 LAB — MAGNESIUM: Magnesium: 2 mg/dL (ref 1.7–2.4)

## 2019-10-23 LAB — PHOSPHORUS: Phosphorus: 3.5 mg/dL (ref 2.5–4.6)

## 2019-10-23 MED ORDER — GLUCAGON HCL RDNA (DIAGNOSTIC) 1 MG IJ SOLR
INTRAMUSCULAR | Status: AC
  Administered 2019-10-23: 1 mg
  Filled 2019-10-23: qty 1

## 2019-10-23 MED ORDER — DEXTROSE 50 % IV SOLN
INTRAVENOUS | Status: AC
  Filled 2019-10-23: qty 50

## 2019-10-23 MED ORDER — VITAL AF 1.2 CAL PO LIQD
1000.0000 mL | ORAL | Status: AC
  Administered 2019-10-23 – 2019-10-29 (×7): 1000 mL
  Filled 2019-10-23 (×14): qty 1000

## 2019-10-23 MED ORDER — DEXTROSE 50 % IV SOLN
12.5000 g | INTRAVENOUS | Status: AC
  Administered 2019-10-23: 12.5 g via INTRAVENOUS

## 2019-10-23 MED ORDER — FENTANYL CITRATE (PF) 100 MCG/2ML IJ SOLN: 25.0000 ug | INTRAMUSCULAR | Status: DC | PRN

## 2019-10-23 NOTE — Evaluation (Signed)
Occupational Therapy Evaluation Patient Details Name: [name] MRN: [id number] DOB: [birth year] Today's Date: 10/23/2019    History of Present Illness Pt is a 43 y/o male with PMH of childhoold brain tumor, developmental delay, seizure disorder. Admitted 12/19 found in septic shock, severe dehydration with severe hypernatremia, with suspected UTI. Intubated 12/20-12/27.   Clinical Impression   Patient admitted for above and limited by problem list below, including impaired balance, limited functional ROM and use of BUEs, decreased activity tolerance and generalized weakness.  PLOF and home setup based on chart review, pt unable to report and unable to reach family (attempted number in chart). Pt non verbal throughout session, follows simple 1 step commands and nods appropriately. He requires total assist for self care, total assist +2 for bed mobility and sits EOB with mod assist fading to min guard. Will follow acutely to optimize return to PLOF, decrease burden of care for family, and assess needs for splinting.      Follow Up Recommendations  Home health OT;Supervision/Assistance - 24 hour    Equipment Recommendations  Other (comment)(Hoyer lift?)    Recommendations for Other Services       Precautions / Restrictions Precautions Precautions: Fall Restrictions Weight Bearing Restrictions: No      Mobility Bed Mobility Overal bed mobility: Needs Assistance Bed Mobility: Supine to Sit;Sit to Supine     Supine to sit: Total assist;+2 for physical assistance;+2 for safety/equipment Sit to supine: Total assist;+2 for physical assistance;+2 for safety/equipment   General bed mobility comments: support of LB and trunk to and from EOB; +2 assist  Transfers                 General transfer comment: deferred    Balance Overall balance assessment: Needs assistance Sitting-balance support: No upper extremity supported;Feet supported Sitting balance-Leahy Scale:  Poor Sitting balance - Comments: L lateral and posterior lean, mod assist progressing to min guard for safety  Postural control: Posterior lean;Left lateral lean                                 ADL either performed or assessed with clinical judgement   ADL Overall ADL's : Needs assistance/impaired                                     Functional mobility during ADLs: Total assistance;+2 for physical assistance;+2 for safety/equipment General ADL Comments: total assist for all self care at this time, anticipate baseline      Vision   Vision Assessment?: No apparent visual deficits Additional Comments: able to scan room and locate therapists appropriately     Perception     Praxis      Pertinent Vitals/Pain Pain Assessment: Faces Faces Pain Scale: Hurts even more Pain Location: generalized  Pain Descriptors / Indicators: Discomfort;Guarding;Grimacing Pain Intervention(s): Monitored during session;Repositioned;Limited activity within patient's tolerance     Hand Dominance     Extremity/Trunk Assessment Upper Extremity Assessment Upper Extremity Assessment: RUE deficits/detail;LUE deficits/detail RUE Deficits / Details: held in flexion synergy, edema in hand RUE Coordination: decreased gross motor;decreased fine motor LUE Deficits / Details: pt able to open hand, give thumbs up; noted elbow contracture to 90 degrees extension passive only and resistant to further movement  LUE Coordination: decreased fine motor;decreased gross motor   Lower Extremity Assessment Lower Extremity Assessment: Defer to  PT evaluation   Cervical / Trunk Assessment Cervical / Trunk Assessment: Other exceptions Cervical / Trunk Exceptions: forward head,neck; L lateral head tilt    Communication Communication Communication: Expressive difficulties(non verbal during session, but nods appropriately)   Cognition Arousal/Alertness: Awake/alert Behavior During Therapy:  Flat affect Overall Cognitive Status: History of cognitive impairments - at baseline                                 General Comments: pt able to follow simple commands, nod appropriately, but non verbal throughout session   General Comments  noted several fluid blisters on hands and L foot; skin tear to posterior L distal humerus; placed pillow between elbow and flank for mild stretch    Exercises     Shoulder Instructions      Home Living Family/patient expects to be discharged to:: Private residence Living Arrangements: Parent;Other relatives Available Help at Discharge: Family;Available 24 hours/day Type of Home: House       Home Layout: One level               Home Equipment: Hospital bed;Wheelchair - manual   Additional Comments: assist from brother in law and sister 24/7--taken per chart review as patient unable to report      Prior Functioning/Environment Level of Independence: Needs assistance  Gait / Transfers Assistance Needed: 2 person lift to w/c ADL's / Homemaking Assistance Needed: Family assist with all ADLs   Comments: per chart review, pt unable to report and attempted to contact family with no answer        OT Problem List: Decreased strength;Decreased activity tolerance;Impaired balance (sitting and/or standing);Decreased safety awareness;Decreased knowledge of use of DME or AE;Pain;Impaired UE functional use;Decreased coordination;Decreased cognition;Decreased range of motion      OT Treatment/Interventions: Self-care/ADL training;Therapeutic exercise;Therapeutic activities;Splinting;Patient/family education;Balance training    OT Goals(Current goals can be found in the care plan section) Acute Rehab OT Goals OT Goal Formulation: Patient unable to participate in goal setting Time For Goal Achievement: 11/06/19 Potential to Achieve Goals: Fair  OT Frequency: Min 2X/week   Barriers to D/C:            Co-evaluation PT/OT/SLP  Co-Evaluation/Treatment: Yes Reason for Co-Treatment: Complexity of the patient's impairments (multi-system involvement)   OT goals addressed during session: ADL's and self-care      AM-PAC OT "6 Clicks" Daily Activity     Outcome Measure Help from another person eating meals?: Total Help from another person taking care of personal grooming?: Total Help from another person toileting, which includes using toliet, bedpan, or urinal?: Total Help from another person bathing (including washing, rinsing, drying)?: Total Help from another person to put on and taking off regular upper body clothing?: Total Help from another person to put on and taking off regular lower body clothing?: Total 6 Click Score: 6   End of Session Nurse Communication: Mobility status  Activity Tolerance: Patient tolerated treatment well Patient left: in bed;with call bell/phone within reach;with bed alarm set  OT Visit Diagnosis: Other abnormalities of gait and mobility (R26.89);Muscle weakness (generalized) (M62.81);Pain Pain - part of body: (generalized )                Time: [id number] OT Time Calculation (min): 23 min Charges:  OT General Charges $OT Visit: 1 Visit OT Evaluation $OT Eval High Complexity: 1 High  Jolaine Artist, OT Acute Rehabilitation Services Pager [phone] Office [phone]  Delight Stare 10/23/2019, 12:25 PM

## 2019-10-23 NOTE — Progress Notes (Signed)
Hypoglycemic Event  CBG: Results for ELIOR, ALOI (MRN [id number]) as of 10/23/2019 21:03  Ref. Range 10/23/2019 20:11  Glucose-Capillary Latest Ref Range: 70 - 99 mg/dL 59 (L)    Treatment: Glucagon IM 1 mg  Symptoms: None  Follow-up CBG: Time: CBG Result  Results for GRACESON, HEBDA (MRN [id number]) as of 10/23/2019 21:03  Ref. Range 10/23/2019 20:55  Glucose-Capillary Latest Ref Range: 70 - 99 mg/dL 136 (H)   Possible Reasons for Event: Inadequate meal intake  Comments/MD notified:    Viviano Simas

## 2019-10-23 NOTE — Procedures (Signed)
Cortrak  Person Inserting Tube:  Esaw Dace, RD Tube Type:  Cortrak - 43 inches Tube Location:  Left nare Initial Placement:  Stomach Secured by: Bridle Technique Used to Measure Tube Placement:  Documented cm marking at nare/ corner of mouth Cortrak Secured At:  67 cm Procedure Comments:  Cortrak Tube Team Note:  Consult received to place a Cortrak feeding tube.   No x-ray is required. RN may begin using tube.   If the tube becomes dislodged please keep the tube and contact the Cortrak team at [email or web address] (password [email or web address]) for replacement.  If after hours and replacement cannot be delayed, place a NG tube and confirm placement with an abdominal x-ray.  [employer] MS, RDN, LDN, CNSC [phone] Pager  [phone] Weekend/On-Call Pager

## 2019-10-23 NOTE — Evaluation (Signed)
Clinical/Bedside Swallow Evaluation Patient Details  Name: [name] MRN: [id number] Date of Birth: 1976-02-17  Today's Date: 10/23/2019 Time: SLP Start Time (ACUTE ONLY): 0950 SLP Stop Time (ACUTE ONLY): 1000 SLP Time Calculation (min) (ACUTE ONLY): 10 min  Past Medical History:  Past Medical History:  Diagnosis Date  . Brain tumor (benign) ([place])   . Hypertension   . Mental retardation, mild (I.Q. 50-70)   . Seizures ([place])    Past Surgical History:  Past Surgical History:  Procedure Laterality Date  . BRAIN SURGERY     HPI:   43 year old male with a childhood brain tumor and developmental delay as well as seizures disorder, admitted from home.  He presents with several days of decreased oral intake dehydration and decreased mentation and mental status from baseline.  Was admitted early the morning of 12/19 found to be in septic shock, severe dehydration with severe hypernatremia, with suspected source to be UTI.  He was intubated for worsening respiratory acidosis and clinical coma. (ETT 12/20-27). CT brain 12/24 Slightly decreased size of the lateral ventricles with mild leftward.  Pt had MBS when admitted to [facility] in Sept 2020, found to have dysphagia with silent aspiration and dysphagia 1/nectar liquids was recommended.    Assessment / Plan / Recommendation Clinical Impression  Pt with baseline dysphagia presents with concerns for acute-on-chronic exacerbation s/p seven-day intubation.  Pt participated in limited assessment; HOB elevated.  He accepted ice chip trials with open-mouth posture, propelling material/masticating and initiating a swallow response.  Each swallow followed by wet/congested cough; yankauers unable to reach posterior pharynx.  Occasional, spontaneous voicing noted, and pt followed some oral motor commands; no verbalizations (baseline communication not known at this time).  He appears to be at high risk for aspiration currently.  Recommend that he remain NPO  for now; consider cortrak.  SLP will return to assess readiness for POs; he will likely need an MBS as he did during last admission.  D/W RN. SLP Visit Diagnosis: Dysphagia, oropharyngeal phase (R13.12)    Aspiration Risk  Moderate aspiration risk    Diet Recommendation   npo       Other  Recommendations Oral Care Recommendations: Oral care QID   Follow up Recommendations Other (comment)(tba)      Frequency and Duration min 2x/week  2 weeks       Prognosis Prognosis for Safe Diet Advancement: Good      Swallow Study   General HPI:  43 year old male with a childhood brain tumor and developmental delay as well as seizures disorder, admitted from home.  He presents with several days of decreased oral intake dehydration and decreased mentation and mental status from baseline.  Was admitted early the morning of 12/19 found to be in septic shock, severe dehydration with severe hypernatremia, with suspected source to be UTI.  He was intubated for worsening respiratory acidosis and clinical coma. (ETT 12/20-27). CT brain 12/24 Slightly decreased size of the lateral ventricles with mild leftward.  Pt had MBS when admitted to [facility] in Sept 2020, found to have dysphagia with silent aspiration and dysphagia 1/nectar liquids was recommended.  Type of Study: Bedside Swallow Evaluation Previous Swallow Assessment: see HPI Diet Prior to this Study: NPO Temperature Spikes Noted: No History of Recent Intubation: Yes Length of Intubations (days): 7 days Date extubated: 10/22/19 Behavior/Cognition: Alert Oral Cavity Assessment: Within Functional Limits Oral Care Completed by SLP: Recent completion by staff Oral Cavity - Dentition: Adequate natural dentition Self-Feeding Abilities: Total assist  Patient Positioning: Upright in bed Baseline Vocal Quality: Low vocal intensity Volitional Cough: Cognitively unable to elicit Volitional Swallow: Unable to elicit    Oral/Motor/Sensory Function Overall  Oral Motor/Sensory Function: Moderate impairment Facial Symmetry: Abnormal symmetry right Lingual ROM: Suspected CN XII (hypoglossal) dysfunction   Ice Chips Ice chips: Impaired Presentation: Spoon Oral Phase Impairments: Reduced lingual movement/coordination;Reduced labial seal Oral Phase Functional Implications: Prolonged oral transit Pharyngeal Phase Impairments: Cough - Immediate   Thin Liquid Thin Liquid: Not tested    Nectar Thick Nectar Thick Liquid: Not tested   Honey Thick Honey Thick Liquid: Not tested   Puree Puree: Not tested   Solid     Solid: Not tested      Juan Quam Laurice 10/23/2019,10:13 AM  Estill Bamberg L. Tivis Ringer, Tat [place]oli Office number [phone] Pager [phone]

## 2019-10-23 NOTE — Progress Notes (Addendum)
Nutrition Follow-up  DOCUMENTATION CODES:   Severe malnutrition in context of social or environmental circumstances, Underweight  INTERVENTION:  -Resume: Vital AF 1.2 at goal rate of 65 ml/h (1560 ml per day)  -Provides 1872 kcal, 117 gm protein, 1265 ml free water daily  -Free water 250 ml q 4 hrs (provides 1000 ml daily)  NUTRITION DIAGNOSIS:   Severe Malnutrition related to social / environmental circumstances(developmental delay) as evidenced by severe fat depletion, severe muscle depletion, percent weight loss(26.5% weight loss within the past year).  Addressed with enteral feeding  GOAL:   Patient will meet greater than or equal to 90% of their needs  Progressing-OGT placement and enteral feeding  MONITOR:   Vent status, TF tolerance, Labs, Skin, Weight trends  REASON FOR ASSESSMENT:   Consult Enteral/tube feeding initiation and management  ASSESSMENT:  12/19-admission 43 yo male admitted with septic shock, severe dehydration, severe hypernatremia, suspected source UTI. Recent several days of decreased oral intake, decreased mental status. 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  12/24-CT brain- no intercranial hemorrhage 12/27-EEG-generalized irregular slow activity. 12/27-Patient extubated 0923 and placed on 4 liters  12/28  -WOC assessment-Pressure injuries: sacrum DTPI, Right buttock and Left medial heel (stage 2).  -ST bedside swallow evaluation reviewed-recommends NPO. Moderate aspiration risk.  Cortrak team member placed NGT-left nare into the stomach. Unclear at this time whether pt will be able to eventually resume oral intake. Nutrition needs reassessed. Estimated needs based on admission wt. Weight has increased since admission from 50 kg to 69.7 kg.  -Resume tube feeding Vital AF 1.2 at goal rate of 65 ml/h (1560 ml per day).   Intake/Output Summary (Last 24 hours) at 10/23/2019 1458 Last data  filed at 10/23/2019 1400 Gross per 24 hour  Intake 2160.45 ml  Output 2700 ml  Net -539.55 ml   Medications reviewed and include:  Protonix, Insulin IVF-D10% -stopped 12/28 (1247)  Labs: BMP Latest Ref Rng & Units 10/23/2019 10/22/2019 10/22/2019  Glucose 70 - 99 mg/dL 130(H) 101(H) -  BUN 6 - 20 mg/dL 18 25(H) -  Creatinine 0.61 - 1.24 mg/dL 0.49(L) 0.59(L) -  BUN/Creat Ratio 9 - 20 - - -  Sodium 135 - 145 mmol/L 145 146(H) 145  Potassium 3.5 - 5.1 mmol/L 3.6 3.3(L) 3.2(L)  Chloride 98 - 111 mmol/L 111 112(H) -  CO2 22 - 32 mmol/L 28 26 -  Calcium 8.9 - 10.3 mg/dL 7.5(L) 7.6(L) -    Diet Order:   Diet Order            Diet NPO time specified  Diet effective now              EDUCATION NEEDS:   No education needs have been identified at this time  Skin:  Skin Assessment: Skin Integrity Issues: Skin Integrity Issues:: DTI, Stage II DTI: sacrum Stage II: sacrum  Last BM:  12/27 type 7 -small   Height:   Ht Readings from Last 1 Encounters:  10/14/19 5\' 11"  (1.803 m)    Weight:   Wt Readings from Last 1 Encounters:  10/23/19 69.7 kg    Ideal Body Weight:  78.2 kg  BMI:  Body mass index is 21.43 kg/m.  Estimated Nutritional Needs:   Kcal:  1750-1900  Protein:  90-105 gr  Fluid:  >1800 ml daily   Colman Cater MS,RD,CSG,LDN Office: [phone] Pager: [phone]

## 2019-10-23 NOTE — Progress Notes (Signed)
Hypoglycemic Event  CBG: Results for [name], [name] (MRN [id number]) as of 10/23/2019 23:55  Ref. Range 10/23/2019 23:34  Glucose-Capillary Latest Ref Range: 70 - 99 mg/dL 62 (L)    Treatment: D50 25 mL (12.5 gm)  Symptoms: None  Follow-up CBG: Time: CBG Result:  Results for [name], [name] (MRN [id number]) as of 10/24/2019 00:14  Ref. Range 10/24/2019 00:01  Glucose-Capillary Latest Ref Range: 70 - 99 mg/dL 133 (H)   Possible Reasons for Event: Inadequate meal intake  Comments/MD notified:    Viviano Simas

## 2019-10-23 NOTE — Evaluation (Signed)
Physical Therapy Evaluation Patient Details Name: DALAN LAMOTTE MRN: [id number] DOB: 08/21/1976 Today's Date: 10/23/2019   History of Present Illness  Pt is a 43 y/o male with PMH of childhoold brain tumor, developmental delay, seizure disorder. Admitted 12/19 found in septic shock, severe dehydration with severe hypernatremia, with suspected UTI. Intubated 12/20-12/27.  Clinical Impression  Pt admitted with above diagnosis and presents to PT with functional limitations due to deficits listed below (See PT problem list). Pt needs skilled PT to maximize independence and safety to allow discharge to home with very supportive family. Will work toward [place] and tolerance to sit in chair.      Follow Up Recommendations No PT follow up    Equipment Recommendations  None recommended by PT    Recommendations for Other Services       Precautions / Restrictions Precautions Precautions: Fall Restrictions Weight Bearing Restrictions: No      Mobility  Bed Mobility Overal bed mobility: Needs Assistance Bed Mobility: Supine to Sit;Sit to Supine     Supine to sit: Total assist;+2 for physical assistance;+2 for safety/equipment Sit to supine: Total assist;+2 for physical assistance;+2 for safety/equipment   General bed mobility comments: Assist with all aspects  Transfers                 General transfer comment: deferred  Ambulation/Gait                Stairs            Wheelchair Mobility    Modified Rankin (Stroke Patients Only)       Balance Overall balance assessment: Needs assistance Sitting-balance support: No upper extremity supported;Feet supported Sitting balance-Leahy Scale: Poor Sitting balance - Comments: L lateral and posterior lean, mod assist progressing to min guard for safety  Postural control: Posterior lean;Left lateral lean                                   Pertinent Vitals/Pain Pain Assessment: Faces Faces Pain  Scale: Hurts even more Pain Location: generalized  Pain Descriptors / Indicators: Discomfort;Guarding;Grimacing Pain Intervention(s): Limited activity within patient's tolerance;Monitored during session;Repositioned    Home Living Family/patient expects to be discharged to:: Private residence Living Arrangements: Parent;Other relatives(mother, sister, sisters boyfriend) Available Help at Discharge: Family;Available 24 hours/day Type of Home: House       Home Layout: One level Home Equipment: Hospital bed;Wheelchair - manual Additional Comments: multiple family members available to assist    Prior Function Level of Independence: Needs assistance   Gait / Transfers Assistance Needed: 2 person lift to w/c  ADL's / Homemaking Assistance Needed: Family assist with all ADLs  Comments: spoke with brother who confirms above information     Hand Dominance        Extremity/Trunk Assessment   Upper Extremity Assessment Upper Extremity Assessment: Defer to OT evaluation RUE Deficits / Details: held in flexion synergy, edema in hand RUE Coordination: decreased gross motor;decreased fine motor LUE Deficits / Details: pt able to open hand, give thumbs up; noted elbow contracture to 90 degrees extension passive only and resistant to further movement  LUE Coordination: decreased fine motor;decreased gross motor    Lower Extremity Assessment Lower Extremity Assessment: RLE deficits/detail;LLE deficits/detail RLE Deficits / Details: no active movement noted LLE Deficits / Details: some active movement noted    Cervical / Trunk Assessment Cervical / Trunk Assessment: Other exceptions Cervical /  Trunk Exceptions: forward head,neck; L lateral head tilt   Communication   Communication: Expressive difficulties(nods appropriately)  Cognition Arousal/Alertness: Awake/alert Behavior During Therapy: Flat affect Overall Cognitive Status: History of cognitive impairments - at baseline                                  General Comments: pt able to follow simple commands, nod appropriately, but non verbal throughout session      General Comments General comments (skin integrity, edema, etc.): noted several fluid blisters on hands and L foot; skin tear to posterior L distal humerus; placed pillow between elbow and flank for mild stretch    Exercises     Assessment/Plan    PT Assessment Patient needs continued PT services  PT Problem List Decreased strength;Decreased range of motion;Decreased balance;Decreased mobility       PT Treatment Interventions Functional mobility training;Therapeutic activities;Balance training;Therapeutic exercise;Patient/family education    PT Goals (Current goals can be found in the Care Plan section)  Acute Rehab PT Goals PT Goal Formulation: Patient unable to participate in goal setting Time For Goal Achievement: 11/06/19 Potential to Achieve Goals: Good    Frequency Min 2X/week   Barriers to discharge        Co-evaluation PT/OT/SLP Co-Evaluation/Treatment: Yes Reason for Co-Treatment: Complexity of the patient's impairments (multi-system involvement);For patient/therapist safety PT goals addressed during session: Mobility/safety with mobility;Balance OT goals addressed during session: ADL's and self-care       AM-PAC PT "6 Clicks" Mobility  Outcome Measure Help needed turning from your back to your side while in a flat bed without using bedrails?: Total Help needed moving from lying on your back to sitting on the side of a flat bed without using bedrails?: Total Help needed moving to and from a bed to a chair (including a wheelchair)?: Total Help needed standing up from a chair using your arms (e.g., wheelchair or bedside chair)?: Total Help needed to walk in hospital room?: Total Help needed climbing 3-5 steps with a railing? : Total 6 Click Score: 6    End of Session   Activity Tolerance: Patient tolerated  treatment well Patient left: in bed;with call bell/phone within reach;with bed alarm set Nurse Communication: Mobility status;Need for lift equipment PT Visit Diagnosis: Other abnormalities of gait and mobility (R26.89)    Time: [id number] PT Time Calculation (min) (ACUTE ONLY): 25 min   Charges:   PT Evaluation $PT Eval Moderate Complexity: [place] Pager [phone] Office [place] 10/23/2019, 1:56 PM

## 2019-10-23 NOTE — Progress Notes (Signed)
NAME:  [name], MRN:  [id number], DOB:  1976/04/16, LOS: 9 ADMISSION DATE:  10/14/2019,  CHIEF COMPLAINT: Altered mental status  Brief History   Patient is a 43 year old gentleman with a childhood brain tumor and developmental delay as well as seizures disorder who lives in a nursing home.  He presents with several days of decreased oral intake dehydration and decreased mentation and mental status from baseline.  Was admitted early the morning of 12/29 found to be in septic shock, severe dehydration with severe hypernatremia, with suspected source to be UTI.  He was intubated for worsening respiratory acidosis and clinical coma.   Consults:  None  Procedures:  12/20 intubation >  12/20 central line >  Significant Diagnostic Tests:  CXR 12/28 >  1. Removal of ET and NG tubes. 2. Stable left subclavian central venous catheter. 3. Streaky bibasilar atelectasis versus infiltrates and small pleural effusions.  Micro Data:  [id number] 12/20 > test negative, this was PCRx2 Urine culture 12/20 > E. Coli, resistant to Amoicillin, Trimeth/Sulfa, and Ampicillin/Sulbactam  Blood culture 12/19 >   Antimicrobials:  12/19 cefepime > 12/25 12/20 Flagyl x1 12/20 vancomycin > 12/21  Interim history/subjective:  Sitting up in bed, remains nonverbal. No acute events overnight    Objective   Blood pressure 106/73, pulse 92, temperature 98.4 F (36.9 C), temperature source Axillary, resp. rate (!) 22, height 5\' 11"  (1.803 m), weight 69.7 kg, SpO2 99 %.        Intake/Output Summary (Last 24 hours) at 10/23/2019 1156 Last data filed at 10/23/2019 0800 Gross per 24 hour  Intake 1720.83 ml  Output 2450 ml  Net -729.17 ml   Filed Weights   10/19/19 0330 10/21/19 0445 10/23/19 0500  Weight: 71.3 kg 77.2 kg 69.7 kg    Examination: General: Chronically ill adult male, in NAD HEENT: [place]/AT, MM pink/moist, PERRL,  Neuro: Alert and interactive, will track movement in room. Unable to  follow commands  CV: s1s2 regular rate and rhythm, no murmur, rubs, or gallops,  PULM:  Clear to ascultation bilaterally, no increased work of breathing GI: soft, bowel sounds active in all 4 quadrants, non-tender, non-distended Extremities: warm/dry, no edema  Skin: no rashes or lesions  Resolved problems :  Acute kidney injury, likely prerenal with component of intrinsic from UTI  Assessment & Plan:  Patient is a 43 year old gentleman with a history of developmental delay who is bedbound in a nursing home   Acute hypoxemic respiratory failure - intubated secondary, to clinical coma Plan: Extubated 12/27 Encourage pulmonary hygiene  Head of bed 30 degrees Currently on RA Blood culture negative   Encephalopathy with underlying mental retardation status post brain surgery -In the setting of sepsis and severe electrolyte derangements  -Improving  Plan: Electrolytes corrected  Delirium precautions  Minimize sedation   Septic shock secondary to UTI with E.coli  Plan: Remains off pressors  S/P 7 days of IV antibiotics    Multiple electrolyte abnormalities including:  Hypocalcemia hypokalemia hypernatremia hyperchloremia Plan: All electrolyte levels have now been corrected  Continue to trend   Deep tissue injury present on admission Plan: Frequent wound care Q2hr turns  WOC    Severe protein calorie malnutrition Hypoglycemia Plan: Failed swallow eval this am Place small bore NGT / cortrak for nutrition  May need decision on PEG placement  Dietitian consulted  Continue D10 until oral nutrition plan established   Thrombocytopenia present on admission -Likely due to septic shock.  2 points on 4T  score with low probability of HIT. -He is on various medications that can contribute to thrombocytopenia including cephalosporin, valproic acid.  Plan:  Trend, slowly improving  Currently on Keppra  Anemia -No overt signs of bleeding however he has had a fairly  precipitous drop in hemoglobin and hematocrit over past 72 hours.   Plan:  Transfuse per protocol  Trend CBC     Best practice:  Diet: Tube feeds Pain/Anxiety/Delirium protocol (if indicated): PRN fentanyl  VAP protocol (if indicated): Ordered DVT prophylaxis: Subcu heparin GI prophylaxis: On PPI  Glucose control: Monitor Foley yes needed for critical illness Mobility: Bedrest Code Status: Full code, discussed with sister Family Communication: Sister updated early this morning after intubation Disposition: Needs ICU  Labs and Imaging    CBC: Recent Labs  Lab 10/19/19 1539 10/20/19 0357 10/21/19 0321 10/21/19 0336 10/22/19 0339 10/22/19 0418 10/23/19 0351  WBC 6.2 6.2  --  7.4  --  8.8 7.0  NEUTROABS  --   --   --   --   --  4.6  --   HGB 9.1* 8.1* 7.5* 8.3* 7.8* 8.4* 8.0*  HCT 27.8* 25.2* 22.0* 25.0* 23.0* 25.3* 24.5*  MCV 96.2 96.2  --  96.5  --  94.8 96.1  PLT 38* 38*  --  50*  --  83* 126*    Basic Metabolic Panel: Recent Labs  Lab 10/19/19 0327 10/19/19 1101 10/20/19 0357 10/21/19 0321 10/21/19 0336 10/22/19 0339 10/22/19 0418 10/23/19 0351  NA 149* 149* 148* 144 144 145 146* 145  K 3.5 4.1 3.8 3.1* 3.2* 3.2* 3.3* 3.6  CL 122* 120* 119*  --  114*  --  112* 111  CO2 19* 21* 21*  --  24  --  26 28  GLUCOSE 116* 122* 142*  --  116*  --  101* 130*  BUN 45* 42* 35*  --  28*  --  25* 18  CREATININE 0.83 1.03 0.74  --  0.65  --  0.59* 0.49*  CALCIUM 6.9* 7.1* 7.1*  --  7.2*  --  7.6* 7.5*  MG 2.0  --  1.9  --  1.9  --  1.8 2.0  PHOS 1.5*  --  1.4*  --  1.8*  --  2.7 3.5   GFR: Estimated Creatinine Clearance: 118.6 mL/min (A) (by C-G formula based on SCr of 0.49 mg/dL (L)). Recent Labs  Lab 10/20/19 0357 10/21/19 0336 10/22/19 0418 10/23/19 0351  WBC 6.2 7.4 8.8 7.0    Liver Function Tests: Recent Labs  Lab 10/20/19 0357 10/21/19 0336 10/22/19 0418  ALBUMIN 1.3* 1.4* 1.5*   No results for input(s): LIPASE, AMYLASE in the last 168  hours. No results for input(s): AMMONIA in the last 168 hours.  ABG    Component Value Date/Time   PHART 7.519 (H) 10/22/2019 0339   PCO2ART 33.1 10/22/2019 0339   PO2ART 128.0 (H) 10/22/2019 0339   HCO3 27.0 10/22/2019 0339   TCO2 28 10/22/2019 0339   ACIDBASEDEF 2.0 10/21/2019 0321   O2SAT 99.0 10/22/2019 0339     Coagulation Profile: Recent Labs  Lab 10/19/19 0837  INR 1.1    Cardiac Enzymes: No results for input(s): CKTOTAL, CKMB, CKMBINDEX, TROPONINI in the last 168 hours.  HbA1C: Hgb A1c MFr Bld  Date/Time Value Ref Range Status  10/16/2019 11:51 PM 5.3 4.8 - 5.6 % Final    Comment:    (NOTE) Pre diabetes:          5.7%-6.4%  Diabetes:              >6.4% Glycemic control for   <7.0% adults with diabetes     CBG: Recent Labs  Lab 10/23/19 0208 10/23/19 0344 10/23/19 0542 10/23/19 0718 10/23/19 1144  GLUCAP 107* 110* 110* 103* 109*    Critical care time:    Johnsie Cancel, NP-C [facility] Contact / Pager information can be found on Amion  10/23/2019, 11:56 AM

## 2019-10-23 NOTE — Consult Note (Addendum)
WOC Nurse Consult Note: Reason for Consult: Pressure injuries (2), deep tissue pressure injuries (DTPI) noted on 12/27. Wound type: Pressure Pressure Injury POA: No Measurement:  Sacrum:  4cm x 7cm DTPI in evolution, purple discoloration with epidermis just beginning to lift and slough, revealing red wound bed, no drainage. Right buttock:  3cm x 2.5cm deep purple area that does not blanch. No break in skin  Left medial heel pressure injury: Stage 2 (intact, reabsorbing serum-filled blister). Dry. 1cm x 2cm. Left ischial tuberosity:  Recently healed (reepithelialized) area of partial thickness injury measuring 1cm x 1.5cm (no depth, no drainage). Covered with silicone foam for protection. Wound bed:As described above Drainage (amount, consistency, odor) As described above Periwound: Dry, intact. Dressing procedure/placement/frequency: Patient is on a mattress replacement with low air loss feature and is being turned from side to side per protocol. Prevalon Boots are in place.  A silicone foam dressing covers the sacrum and right buttock DTPIs.  A silicone foam covers the recently healed partial thickness skin injury to the Right IT. An external urinary management system (pouch) is in place.  [facility] nursing team will follow, and will see every 7-10 days.  We will also  remain available to this patient, the nursing and medical teams.   Thanks, Maudie Flakes, MSN, RN, [facility], [facility]  Pager# [phone]

## 2019-10-23 NOTE — Progress Notes (Signed)
ABG held at this time due to patient resting comfortably and vitals stable.

## 2019-10-24 LAB — GLUCOSE, CAPILLARY
Glucose-Capillary: 133 mg/dL — ABNORMAL HIGH (ref 70–99)
Glucose-Capillary: 70 mg/dL (ref 70–99)
Glucose-Capillary: 78 mg/dL (ref 70–99)
Glucose-Capillary: 79 mg/dL (ref 70–99)
Glucose-Capillary: 87 mg/dL (ref 70–99)
Glucose-Capillary: 87 mg/dL (ref 70–99)
Glucose-Capillary: 87 mg/dL (ref 70–99)

## 2019-10-24 MED FILL — Medication: Qty: 1 | Status: AC

## 2019-10-24 NOTE — Progress Notes (Signed)
PROGRESS NOTE    [name]  JJH:[id number] DOB: 1976/02/08 DOA: 10/14/2019 PCP: Patient, No Pcp Per   Brief Narrative:  Patient is a 43 year old gentleman with a childhood brain tumor and developmental delay as well as seizures disorder who lives in a nursing home.  He presents with several days of decreased oral intake dehydration and decreased mentation and mental status from baseline.  Was admitted early the morning of 12/19 found to be in septic shock, severe dehydration with severe hypernatremia, with suspected source to be UTI.  He was intubated for worsening respiratory acidosis and clinical coma.   12/29: Patient has completed treatment for septic shock with associated encephalopathy secondary to E. coli UTI.  He also appears to be doing well from a respiratory standpoint.  He continues to have persistent dysphagia which SLP will reevaluate with MBS.  Continue tube feedings as currently ordered and monitor blood glucose closely for stability.  May require PEG tube, which family is okay with if he fails further swallowing evaluations.  Continue Depakote IV for now.  Assessment & Plan:   Active Problems:   Pneumonia   Encounter for central line placement   Acute respiratory failure with hypoxia and hypercapnia (HCC)   Acute respiratory failure (HCC)   Protein-calorie malnutrition, severe   Acute hypoxemic respiratory failure secondary to clinical, status post extubation -Currently on room air as this has resolved  Acute metabolic encephalopathy with underlying MR-improving -Ongoing dysphagia noted with SLP following.  Continue tube feeds -Appears to have been complicated by sepsis as noted below -Appears to be resolving with unknown mental baseline -Continue Depakote  Persistent hypoglycemia -He appears to be off D10 with stable blood glucose readings on tube feeds -Monitor carefully  Seizure disorder -Continue Depakote  Septic shock secondary to E. coli  UTI-resolved -This has resolved after treatment Rocephin for 7 days -Blood pressures are stable on patient off pressors -Hold home antihypertensives for now  Deep tissue injury present on admission -Continue wound/ostomy care  Severe protein calorie malnutrition -Continue tube feeds per nutrition recommendations and monitor blood glucose  Anemia-stable -No overt signs of bleeding -Repeat CBC in a.m.  History of hypertension -Withhold home medications for now until blood pressures further elevate  DVT prophylaxis: Code Status: Full Family Communication: Discussed with sister on phone Disposition Plan: Continue current tube feedings with further SLP evaluation for likely MBS.  Family members okay with PEG tube if needed and patient cannot tolerate oral intake as prior.  Back to skilled nursing facility on discharge.   Consultants:   PCCM  Neurology-signed off  Procedures:  12/20 intubation 12/20 L subclavian central line 12/20 influenza negative  Antimicrobials:  Anti-infectives (From admission, onward)   Start     Dose/Rate Route Frequency Ordered Stop   10/19/19 0800  cefTRIAXone (ROCEPHIN) 2 g in sodium chloride 0.9 % 100 mL IVPB     2 g 200 mL/hr over 30 Minutes Intravenous Every 24 hours 10/19/19 0755 10/20/19 0851   10/17/19 2100  ceFAZolin (ANCEF) IVPB 1 g/50 mL premix  Status:  Discontinued     1 g 100 mL/hr over 30 Minutes Intravenous Every 8 hours 10/17/19 0927 10/19/19 0755   10/15/19 2130  vancomycin (VANCOREADY) IVPB 750 mg/150 mL     750 mg 150 mL/hr over 60 Minutes Intravenous  Once 10/15/19 2034 10/15/19 2310   10/15/19 2100  ceFEPIme (MAXIPIME) 1 g in sodium chloride 0.9 % 100 mL IVPB  Status:  Discontinued  1 g 200 mL/hr over 30 Minutes Intravenous Every 24 hours 10/14/19 2119 10/17/19 0927   10/15/19 0000  vancomycin variable dose per unstable renal function (pharmacist dosing)  Status:  Discontinued      Does not apply See admin instructions  10/14/19 2119 10/16/19 0842   10/14/19 2100  ceFEPIme (MAXIPIME) 2 g in sodium chloride 0.9 % 100 mL IVPB     2 g 200 mL/hr over 30 Minutes Intravenous  Once 10/14/19 2052 10/14/19 2155   10/14/19 2100  metroNIDAZOLE (FLAGYL) IVPB 500 mg     500 mg 100 mL/hr over 60 Minutes Intravenous  Once 10/14/19 2052 10/14/19 2334   10/14/19 2100  vancomycin (VANCOCIN) IVPB 1000 mg/200 mL premix     1,000 mg 200 mL/hr over 60 Minutes Intravenous  Once 10/14/19 2052 10/14/19 2334       Subjective: Patient seen and evaluated today and is currently on tube feeds with stable blood glucose readings.  Objective: Vitals:   10/23/19 1500 10/23/19 1555 10/23/19 2007 10/24/19 0515  BP: 103/68 122/70 105/61 106/63  Pulse: 94 87 73 100  Resp: '15 20 16 16  '$ Temp:  99.3 F (37.4 C) 98.6 F (37 C) 98 F (36.7 C)  TempSrc:  Oral Oral Oral  SpO2: 98% 92% 98% 94%  Weight:      Height:        Intake/Output Summary (Last 24 hours) at 10/24/2019 0924 Last data filed at 10/24/2019 0748 Gross per 24 hour  Intake 1994.76 ml  Output 1650 ml  Net 344.76 ml   Filed Weights   10/19/19 0330 10/21/19 0445 10/23/19 0500  Weight: 71.3 kg 77.2 kg 69.7 kg    Examination:  General exam: Appears calm and comfortable  Respiratory system: Clear to auscultation. Respiratory effort normal.  Currently on room air. Cardiovascular system: S1 & S2 heard, RRR. No JVD, murmurs, rubs, gallops or clicks. No pedal edema. Gastrointestinal system: Abdomen is nondistended, soft and nontender. No organomegaly or masses felt. Normal bowel sounds heard.  Receiving NG tube feeds. Central nervous system: Alert and awake. Extremities: No edema. Skin: No rashes, lesions or ulcers Psychiatry: Cannot be assessed.    Data Reviewed: I have personally reviewed following labs and imaging studies  CBC: Recent Labs  Lab 10/19/19 1539 10/20/19 0357 10/21/19 0321 10/21/19 0336 10/22/19 0339 10/22/19 0418 10/23/19 0351  WBC 6.2  6.2  --  7.4  --  8.8 7.0  NEUTROABS  --   --   --   --   --  4.6  --   HGB 9.1* 8.1* 7.5* 8.3* 7.8* 8.4* 8.0*  HCT 27.8* 25.2* 22.0* 25.0* 23.0* 25.3* 24.5*  MCV 96.2 96.2  --  96.5  --  94.8 96.1  PLT 38* 38*  --  50*  --  83* 485*   Basic Metabolic Panel: Recent Labs  Lab 10/19/19 0327 10/19/19 1101 10/20/19 0357 10/21/19 0321 10/21/19 0336 10/22/19 0339 10/22/19 0418 10/23/19 0351  NA 149* 149* 148* 144 144 145 146* 145  K 3.5 4.1 3.8 3.1* 3.2* 3.2* 3.3* 3.6  CL 122* 120* 119*  --  114*  --  112* 111  CO2 19* 21* 21*  --  24  --  26 28  GLUCOSE 116* 122* 142*  --  116*  --  101* 130*  BUN 45* 42* 35*  --  28*  --  25* 18  CREATININE 0.83 1.03 0.74  --  0.65  --  0.59* 0.49*  CALCIUM 6.9* 7.1* 7.1*  --  7.2*  --  7.6* 7.5*  MG 2.0  --  1.9  --  1.9  --  1.8 2.0  PHOS 1.5*  --  1.4*  --  1.8*  --  2.7 3.5   GFR: Estimated Creatinine Clearance: 118.6 mL/min (A) (by C-G formula based on SCr of 0.49 mg/dL (L)). Liver Function Tests: Recent Labs  Lab 10/20/19 0357 10/21/19 0336 10/22/19 0418  ALBUMIN 1.3* 1.4* 1.5*   No results for input(s): LIPASE, AMYLASE in the last 168 hours. No results for input(s): AMMONIA in the last 168 hours. Coagulation Profile: Recent Labs  Lab 10/19/19 0837  INR 1.1   Cardiac Enzymes: No results for input(s): CKTOTAL, CKMB, CKMBINDEX, TROPONINI in the last 168 hours. BNP (last 3 results) No results for input(s): PROBNP in the last 8760 hours. HbA1C: No results for input(s): HGBA1C in the last 72 hours. CBG: Recent Labs  Lab 10/23/19 2055 10/23/19 2334 10/24/19 0001 10/24/19 0359 10/24/19 0714  GLUCAP 136* 62* 133* 78 87   Lipid Profile: No results for input(s): CHOL, HDL, LDLCALC, TRIG, CHOLHDL, LDLDIRECT in the last 72 hours. Thyroid Function Tests: No results for input(s): TSH, T4TOTAL, FREET4, T3FREE, THYROIDAB in the last 72 hours. Anemia Panel: No results for input(s): VITAMINB12, FOLATE, FERRITIN, TIBC, IRON,  RETICCTPCT in the last 72 hours. Sepsis Labs: No results for input(s): PROCALCITON, LATICACIDVEN in the last 168 hours.  Recent Results (from the past 240 hour(s))  Blood Culture (routine x 2)     Status: None   Collection Time: 10/14/19  9:00 PM   Specimen: BLOOD LEFT HAND  Result Value Ref Range Status   Specimen Description BLOOD LEFT HAND  Final   Special Requests   Final    BOTTLES DRAWN AEROBIC ONLY Blood Culture results may not be optimal due to an inadequate volume of blood received in culture bottles   Culture   Final    NO GROWTH 5 DAYS Performed at [facility], [place] [address] [place], [place] [address]    Report Status 10/19/2019 FINAL  Final  SARS CORONAVIRUS 2 (TAT 6-24 HRS) Nasopharyngeal Nasopharyngeal Swab     Status: None   Collection Time: 10/14/19 10:20 PM   Specimen: Nasopharyngeal Swab  Result Value Ref Range Status   SARS Coronavirus 2 NEGATIVE NEGATIVE Final    Comment: (NOTE) SARS-CoV-2 target nucleic acids are NOT DETECTED. The SARS-CoV-2 RNA is generally detectable in upper and lower respiratory specimens during the acute phase of infection. Negative results do not preclude SARS-CoV-2 infection, do not rule out co-infections with other pathogens, and should not be used as the sole basis for treatment or other patient management decisions. Negative results must be combined with clinical observations, patient history, and epidemiological information. The expected result is Negative. Fact Sheet for Patients: SugarRoll.be Fact Sheet for Healthcare Providers: https://www.woods-mathews.com/ This test is not yet approved or cleared by the [place] FDA and  has been authorized for detection and/or diagnosis of SARS-CoV-2 by FDA under an Emergency Use Authorization (EUA). This EUA will remain  in effect (meaning this test can be used) for the duration of the COVID-19 declaration under Section 56 4(b)(1) of  the Act, 21 U.S.C. section 360bbb-3(b)(1), unless the authorization is terminated or revoked sooner. Performed at [facility], [place] [address] [place], [place] [address]   Respiratory Panel by RT PCR (Flu A&B, Covid) - Nasopharyngeal Swab     Status: None  Collection Time: 10/14/19 10:41 PM   Specimen: Nasopharyngeal Swab  Result Value Ref Range Status   SARS Coronavirus 2 by RT PCR NEGATIVE NEGATIVE Final    Comment: (NOTE) SARS-CoV-2 target nucleic acids are NOT DETECTED. The SARS-CoV-2 RNA is generally detectable in upper respiratoy specimens during the acute phase of infection. The lowest concentration of SARS-CoV-2 viral copies this assay can detect is 131 copies/mL. A negative result does not preclude SARS-Cov-2 infection and should not be used as the sole basis for treatment or other patient management decisions. A negative result may occur with  improper specimen collection/handling, submission of specimen other than nasopharyngeal swab, presence of viral mutation(s) within the areas targeted by this assay, and inadequate number of viral copies (<131 copies/mL). A negative result must be combined with clinical observations, patient history, and epidemiological information. The expected result is Negative. Fact Sheet for Patients:  PinkCheek.be Fact Sheet for Healthcare Providers:  GravelBags.it This test is not yet ap proved or cleared by the [place] FDA and  has been authorized for detection and/or diagnosis of SARS-CoV-2 by FDA under an Emergency Use Authorization (EUA). This EUA will remain  in effect (meaning this test can be used) for the duration of the COVID-19 declaration under Section 564(b)(1) of the Act, 21 U.S.C. section 360bbb-3(b)(1), unless the authorization is terminated or revoked sooner.    Influenza A by PCR NEGATIVE NEGATIVE Final   Influenza B by PCR NEGATIVE NEGATIVE Final     Comment: (NOTE) The Xpert Xpress SARS-CoV-2/FLU/RSV assay is intended as an aid in  the diagnosis of influenza from Nasopharyngeal swab specimens and  should not be used as a sole basis for treatment. Nasal washings and  aspirates are unacceptable for Xpert Xpress SARS-CoV-2/FLU/RSV  testing. Fact Sheet for Patients: PinkCheek.be Fact Sheet for Healthcare Providers: GravelBags.it This test is not yet approved or cleared by the [place] FDA and  has been authorized for detection and/or diagnosis of SARS-CoV-2 by  FDA under an Emergency Use Authorization (EUA). This EUA will remain  in effect (meaning this test can be used) for the duration of the  Covid-19 declaration under Section 564(b)(1) of the Act, 21  U.S.C. section 360bbb-3(b)(1), unless the authorization is  terminated or revoked. Performed at [facility], [place] [address] [place], [place] [address]   Urine culture     Status: Abnormal   Collection Time: 10/15/19 12:47 AM   Specimen: In/Out Cath Urine  Result Value Ref Range Status   Specimen Description IN/OUT CATH URINE  Final   Special Requests   Final    NONE Performed at [facility], [place] [address] [place], [place] [address]    Culture >=100,000 COLONIES/mL ESCHERICHIA COLI (A)  Final   Report Status 10/17/2019 FINAL  Final   Organism ID, Bacteria ESCHERICHIA COLI (A)  Final      Susceptibility   Escherichia coli - MIC*    AMPICILLIN >=32 RESISTANT Resistant     CEFAZOLIN <=4 SENSITIVE Sensitive     CEFTRIAXONE <=1 SENSITIVE Sensitive     CIPROFLOXACIN <=0.25 SENSITIVE Sensitive     GENTAMICIN <=1 SENSITIVE Sensitive     IMIPENEM <=0.25 SENSITIVE Sensitive     NITROFURANTOIN <=16 SENSITIVE Sensitive     TRIMETH/SULFA >=320 RESISTANT Resistant     AMPICILLIN/SULBACTAM >=32 RESISTANT Resistant     PIP/TAZO <=4 SENSITIVE Sensitive     * >=100,000 COLONIES/mL ESCHERICHIA COLI  Blood  Culture (routine x 2)  Status: None   Collection Time: 10/15/19  3:31 AM   Specimen: BLOOD  Result Value Ref Range Status   Specimen Description BLOOD  Final   Special Requests   Final    LEFT SUBCLAVIAN BOTTLES DRAWN AEROBIC AND ANAEROBIC Blood Culture results may not be optimal due to an excessive volume of blood received in culture bottles   Culture   Final    NO GROWTH 5 DAYS Performed at [facility], [place] [address] [place], [place] [address]    Report Status 10/20/2019 FINAL  Final  MRSA PCR Screening     Status: None   Collection Time: 10/15/19  4:12 AM   Specimen: Nasal Mucosa; Nasopharyngeal  Result Value Ref Range Status   MRSA by PCR NEGATIVE NEGATIVE Final    Comment:        The GeneXpert MRSA Assay (FDA approved for NASAL specimens only), is one component of a comprehensive MRSA colonization surveillance program. It is not intended to diagnose MRSA infection nor to guide or monitor treatment for MRSA infections. Performed at [facility], [place] [address] [place], [place] [address]          Radiology Studies: DG Chest Port 1 View  Result Date: 10/23/2019 CLINICAL DATA:  Recent respiratory failure. EXAM: PORTABLE CHEST 1 VIEW COMPARISON:  Chest x-ray 10/22/2019 FINDINGS: The endotracheal tube has been removed. The NG tube has been removed. The left subclavian central venous catheter is stable. 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 The cardiac silhouette, mediastinal and hilar contours are within normal limits and stable. Persistent streaky bibasilar atelectasis versus infiltrate and small pleural effusions. IMPRESSION: 1. Removal of ET and NG tubes. 2. Stable left subclavian central venous catheter. 3. Streaky bibasilar atelectasis versus infiltrates and small pleural effusions. Electronically Signed   By: Marijo Sanes M.D.   On: 10/23/2019 06:29        Scheduled Meds: . chlorhexidine gluconate (MEDLINE KIT)  15 mL Mouth Rinse BID  .  Chlorhexidine Gluconate Cloth  6 each Topical Daily  . dextrose      . free water  250 mL Per Tube Q4H  . insulin aspart  0-9 Units Subcutaneous Q4H  . insulin aspart  3 Units Subcutaneous Q4H  . pantoprazole sodium  40 mg Per Tube Daily   Continuous Infusions: . sodium chloride 10 mL/hr at 10/23/19 1300  . sodium chloride    . dextrose Stopped (10/23/19 1247)  . feeding supplement (VITAL AF 1.2 CAL) 1,000 mL (10/23/19 2347)  . valproate sodium 500 mg (10/23/19 2144)     LOS: 10 days    Time spent: 30 minutes    Marveline Profeta Darleen Crocker, DO [facility] Pager [phone]  If 7PM-7AM, please contact night-coverage [email or web address] Password [facility] 10/24/2019, 9:24 AM
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
## 2019-10-25 LAB — CBC
HCT: 24.2 % — ABNORMAL LOW (ref 39.0–52.0)
Hemoglobin: 7.8 g/dL — ABNORMAL LOW (ref 13.0–17.0)
MCH: 31.3 pg (ref 26.0–34.0)
MCHC: 32.2 g/dL (ref 30.0–36.0)
MCV: 97.2 fL (ref 80.0–100.0)
Platelets: 187 10*3/uL (ref 150–400)
RBC: 2.49 MIL/uL — ABNORMAL LOW (ref 4.22–5.81)
RDW: 16.7 % — ABNORMAL HIGH (ref 11.5–15.5)
WBC: 9.6 10*3/uL (ref 4.0–10.5)
nRBC: 0.4 % — ABNORMAL HIGH (ref 0.0–0.2)

## 2019-10-25 LAB — BASIC METABOLIC PANEL
Anion gap: 7 (ref 5–15)
BUN: 13 mg/dL (ref 6–20)
CO2: 28 mmol/L (ref 22–32)
Calcium: 7.3 mg/dL — ABNORMAL LOW (ref 8.9–10.3)
Chloride: 110 mmol/L (ref 98–111)
Creatinine, Ser: 0.49 mg/dL — ABNORMAL LOW (ref 0.61–1.24)
GFR calc Af Amer: >60 mL/min (ref >60)
GFR calc non Af Amer: >60 mL/min (ref >60)
Glucose, Bld: 97 mg/dL (ref 70–99)
Potassium: 3.5 mmol/L (ref 3.5–5.1)
Sodium: 145 mmol/L (ref 135–145)

## 2019-10-25 LAB — GLUCOSE, CAPILLARY
Glucose-Capillary: 80 mg/dL (ref 70–99)
Glucose-Capillary: 91 mg/dL (ref 70–99)
Glucose-Capillary: 90 mg/dL (ref 70–99)
Glucose-Capillary: 93 mg/dL (ref 70–99)
Glucose-Capillary: 90 mg/dL (ref 70–99)
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
## 2019-10-25 NOTE — Progress Notes (Signed)
Physical Therapy Treatment Patient Details Name: [name] MRN: [id number] DOB: [birth year] Today's Date: 10/25/2019    History of Present Illness Pt is a 43 y/o male with PMH of childhoold brain tumor, developmental delay, seizure disorder. Admitted 12/19 found in septic shock, severe dehydration with severe hypernatremia, with suspected UTI. Intubated 12/20-12/27.    PT Comments    Pt was seen for mobility by performing ROM to legs, with pt being verbally unengaged during this part of session.  PT worked on repositioning and midline orientation with minor verbalization elicited in this portion.  Pt was able to get head alignment more in midline, and was then able to get him off the L side bedrail to be more protected on L side.  Follow acutely with mobility to get to the chair per recent addition to orders for pt's.    Follow Up Recommendations  No PT follow up     Equipment Recommendations  None recommended by PT    Recommendations for Other Services       Precautions / Restrictions Precautions Precautions: Fall Precaution Comments: use care on L side of head, surgery from childhood Restrictions Weight Bearing Restrictions: No    Mobility  Bed Mobility Overal bed mobility: Needs Assistance Bed Mobility: Rolling Rolling: Total assist         General bed mobility comments: total assist to reposition on the bed  Transfers                 General transfer comment: deferred  Ambulation/Gait                 Stairs             Wheelchair Mobility    Modified Rankin (Stroke Patients Only)       Balance                                            Cognition Arousal/Alertness: Lethargic Behavior During Therapy: Flat affect Overall Cognitive Status: History of cognitive impairments - at baseline                                 General Comments: pt able to follow simple commands, nod appropriately, but  non verbal throughout session      Exercises General Exercises - Lower Extremity Ankle Circles/Pumps: PROM;5 reps Heel Slides: PROM;AAROM;10 reps Hip ABduction/ADduction: PROM;AAROM;10 reps Straight Leg Raises: PROM;10 reps Hip Flexion/Marching: PROM;10 reps    General Comments        Pertinent Vitals/Pain Pain Assessment: Faces Faces Pain Scale: No hurt    Home Living                      Prior Function            PT Goals (current goals can now be found in the care plan section) Acute Rehab PT Goals Patient Stated Goal: none    Frequency    Min 2X/week      PT Plan Current plan remains appropriate    Co-evaluation              AM-PAC PT "6 Clicks" Mobility   Outcome Measure  Help needed turning from your back to your side while in a flat bed without using bedrails?: Total Help needed  moving from lying on your back to sitting on the side of a flat bed without using bedrails?: Total Help needed moving to and from a bed to a chair (including a wheelchair)?: Total Help needed standing up from a chair using your arms (e.g., wheelchair or bedside chair)?: Total Help needed to walk in hospital room?: Total Help needed climbing 3-5 steps with a railing? : Total 6 Click Score: 6    End of Session   Activity Tolerance: Patient tolerated treatment well Patient left: in bed;with call bell/phone within reach;with bed alarm set Nurse Communication: Mobility status;Need for lift equipment PT Visit Diagnosis: Other abnormalities of gait and mobility (R26.89)     Time: 1433-1500 PT Time Calculation (min) (ACUTE ONLY): 27 min  Charges:  $Therapeutic Exercise: 8-22 mins $Therapeutic Activity: 8-22 mins                   Ramond Dial 10/25/2019, 6:13 PM   Mee Hives, PT MS Acute Rehab Dept. Number: [place]

## 2019-10-26 LAB — GLUCOSE, CAPILLARY
Glucose-Capillary: 104 mg/dL — ABNORMAL HIGH (ref 70–99)
Glucose-Capillary: 73 mg/dL (ref 70–99)
Glucose-Capillary: 85 mg/dL (ref 70–99)
Glucose-Capillary: 93 mg/dL (ref 70–99)
Glucose-Capillary: 93 mg/dL (ref 70–99)
Glucose-Capillary: 95 mg/dL (ref 70–99)
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
## 2019-10-26 NOTE — Progress Notes (Signed)
Occupational Therapy Treatment Patient Details Name: [name] MRN: [id number] DOB: Jun 06, 1976 Today's Date: 10/26/2019    History of present illness Pt is a 43 y/o male with PMH of childhoold brain tumor, developmental delay, seizure disorder. Admitted 12/19 found in septic shock, severe dehydration with severe hypernatremia, with suspected UTI. Intubated 12/20-12/27.   OT comments  Pt. Seen for skilled OT treatment session.  Pt. Responding to one step commands with increased time and was able to initiate assisting with L UE to wash face and perform hand ROM to LUE.  Pt. Shaking head yes/no direction appropriately to questions.   Follow Up Recommendations  Home health OT;Supervision/Assistance - 24 hour    Equipment Recommendations  Other (comment)    Recommendations for Other Services      Precautions / Restrictions Precautions Precautions: Fall Precaution Comments: use care on L side of head, surgery from childhood Restrictions Weight Bearing Restrictions: No       Mobility Bed Mobility                  Transfers                      Balance                                           ADL either performed or assessed with clinical judgement   ADL Overall ADL's : Needs assistance/impaired     Grooming: Wash/dry face;Maximal assistance;Bed level Grooming Details (indicate cue type and reason): pt. able to bring L UE to face and wash left side of face. assisted with R side secondary to rom limitations of LUE                               General ADL Comments: pt. nodding appropriately when asked questions. when asked if i could see his hands he moved both of them to bring them out from under the covers.  i asked if he could move or open his hand and he nodded yes and performed abduction and adduction of L digits.  also able to follow one step command to bring wash cloth to face and initiated head movement on wash cloth  to wash face.  when asked if he was comfortable he did not nod in yes direction.  asked if he needed to adjust his head as it was leaning to left towards bed rail.  he initiated head movement of the pillow towards midline.  i assisted with pillow placement to try and maintain the positioning he had created more in midline.     Vision       Perception     Praxis      Cognition Arousal/Alertness: Lethargic Behavior During Therapy: Flat affect Overall Cognitive Status: History of cognitive impairments - at baseline                                 General Comments: pt able to follow simple commands, nod appropriately, but non verbal throughout session-when cna entered room for blood sugar check i asked pt. which finger and he wiggled the L index finger        Exercises Hand Exercises Digit Composite Abduction: AROM;Left;5 reps;Supine Digit Composite Adduction: AROM;Left;5 reps;Supine  Shoulder Instructions       General Comments      Pertinent Vitals/ Pain       Pain Assessment: No/denies pain(shook head no when asked if he was in pain)  Home Living                                          Prior Functioning/Environment              Frequency  Min 2X/week        Progress Toward Goals  OT Goals(current goals can now be found in the care plan section)  Progress towards OT goals: Progressing toward goals     Plan      Co-evaluation                 AM-PAC OT "6 Clicks" Daily Activity     Outcome Measure   Help from another person eating meals?: Total Help from another person taking care of personal grooming?: Total Help from another person toileting, which includes using toliet, bedpan, or urinal?: Total Help from another person bathing (including washing, rinsing, drying)?: Total Help from another person to put on and taking off regular upper body clothing?: Total Help from another person to put on and taking off  regular lower body clothing?: Total 6 Click Score: 6    End of Session    OT Visit Diagnosis: Other abnormalities of gait and mobility (R26.89);Muscle weakness (generalized) (M62.81);Pain   Activity Tolerance Patient tolerated treatment well   Patient Left in bed;with call bell/phone within reach;with bed alarm set   Nurse Communication          Time: [id number] OT Time Calculation (min): 10 min  Charges: OT General Charges $OT Visit: 1 Visit OT Treatments $Therapeutic Activity: 8-22 mins  Rico Junker M, COTA/L 10/26/2019, 11:51 AM

## 2019-10-26 NOTE — Progress Notes (Signed)
Physical Therapy Treatment Patient Details Name: [name] MRN: [id number] DOB: 10/22/1976 Today's Date: 10/26/2019    History of Present Illness Pt is a 43 y/o male with PMH of childhoold brain tumor, developmental delay, seizure disorder. Admitted 12/19 found in septic shock, severe dehydration with severe hypernatremia, with suspected UTI. Intubated 12/20-12/27.    PT Comments    Pt was seen for transfer as ordered with updated PT goals, and used Maximove with CNA to problem solve and work on comfortable positioning.  Pt is able to have legs fully supported in upright posture in recliner, and is propped with pillows under arms to reduce edema and avoid skin irritation.  Follow acutely for transfers and there ex to LE's as tolerated.   Follow Up Recommendations  No PT follow up     Equipment Recommendations  None recommended by PT    Recommendations for Other Services       Precautions / Restrictions Precautions Precautions: Fall Precaution Comments: use care on L side of head, surgery from childhood Restrictions Weight Bearing Restrictions: No    Mobility  Bed Mobility Overal bed mobility: Needs Assistance Bed Mobility: Rolling Rolling: Total assist         General bed mobility comments: rolled to set up on lift seat for getting OOB  Transfers Overall transfer level: Needs assistance Equipment used: Sliding board(maxi move)             General transfer comment: maxi move with CNA to get to chair  Ambulation/Gait                 Stairs             Wheelchair Mobility    Modified Rankin (Stroke Patients Only)       Balance                                            Cognition Arousal/Alertness: Lethargic Behavior During Therapy: Flat affect Overall Cognitive Status: History of cognitive impairments - at baseline                                        Exercises      General Comments  General comments (skin integrity, edema, etc.): repositioning on chair to get pt in safe and comfortable set up to sit in chair over the afternoon      Pertinent Vitals/Pain Pain Assessment: No/denies pain    Home Living                      Prior Function            PT Goals (current goals can now be found in the care plan section) Acute Rehab PT Goals Patient Stated Goal: none Progress towards PT goals: Progressing toward goals    Frequency    Min 2X/week      PT Plan Current plan remains appropriate    Co-evaluation              AM-PAC PT "6 Clicks" Mobility   Outcome Measure  Help needed turning from your back to your side while in a flat bed without using bedrails?: Total Help needed moving from lying on your back to sitting on the side of a flat  bed without using bedrails?: Total Help needed moving to and from a bed to a chair (including a wheelchair)?: Total Help needed standing up from a chair using your arms (e.g., wheelchair or bedside chair)?: Total Help needed to walk in hospital room?: Total Help needed climbing 3-5 steps with a railing? : Total 6 Click Score: 6    End of Session   Activity Tolerance: Patient limited by fatigue;Treatment limited secondary to medical complications (Comment) Patient left: in chair;with call bell/phone within reach;with chair alarm set;with nursing/sitter in room Nurse Communication: Mobility status PT Visit Diagnosis: Other abnormalities of gait and mobility (R26.89)     Time: [id number] PT Time Calculation (min) (ACUTE ONLY): 38 min  Charges:  $Therapeutic Activity: 38-52 mins                    Ramond Dial 10/26/2019, 7:09 PM   Mee Hives, PT MS Acute Rehab Dept. Number: [place] and [place]

## 2019-10-27 LAB — GLUCOSE, CAPILLARY
Glucose-Capillary: 104 mg/dL — ABNORMAL HIGH (ref 70–99)
Glucose-Capillary: 106 mg/dL — ABNORMAL HIGH (ref 70–99)
Glucose-Capillary: 108 mg/dL — ABNORMAL HIGH (ref 70–99)
Glucose-Capillary: 83 mg/dL (ref 70–99)
Glucose-Capillary: 89 mg/dL (ref 70–99)
Glucose-Capillary: 99 mg/dL (ref 70–99)

## 2019-10-27 MED ORDER — FUROSEMIDE 10 MG/ML IJ SOLN
20.0000 mg | Freq: Once | INTRAMUSCULAR | Status: AC
  Administered 2019-10-27: 20 mg via INTRAVENOUS
  Filled 2019-10-27: qty 2

## 2019-10-27 MED ORDER — LOPERAMIDE HCL 2 MG PO CAPS
2.0000 mg | ORAL_CAPSULE | Freq: Two times a day (BID) | ORAL | Status: DC
  Administered 2019-10-27 – 2019-11-03 (×13): 2 mg via ORAL
  Filled 2019-10-27 (×13): qty 1
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
## 2019-10-28 DIAGNOSIS — B59 Pneumocystosis: Secondary | ICD-10-CM

## 2019-10-28 LAB — BASIC METABOLIC PANEL
Anion gap: 10 (ref 5–15)
BUN: 6 mg/dL (ref 6–20)
CO2: 30 mmol/L (ref 22–32)
Calcium: 7.6 mg/dL — ABNORMAL LOW (ref 8.9–10.3)
Chloride: 106 mmol/L (ref 98–111)
Creatinine, Ser: 0.42 mg/dL — ABNORMAL LOW (ref 0.61–1.24)
GFR calc Af Amer: >60 mL/min (ref >60)
GFR calc non Af Amer: >60 mL/min (ref >60)
Glucose, Bld: 97 mg/dL (ref 70–99)
Potassium: 3.2 mmol/L — ABNORMAL LOW (ref 3.5–5.1)
Sodium: 146 mmol/L — ABNORMAL HIGH (ref 135–145)

## 2019-10-28 LAB — CBC WITH DIFFERENTIAL/PLATELET
Abs Immature Granulocytes: 0.02 10*3/uL (ref 0.00–0.07)
Basophils Absolute: 0 10*3/uL (ref 0.0–0.1)
Basophils Relative: 0 %
Eosinophils Absolute: 0 10*3/uL (ref 0.0–0.5)
Eosinophils Relative: 0 %
HCT: 21.6 % — ABNORMAL LOW (ref 39.0–52.0)
Hemoglobin: 7 g/dL — ABNORMAL LOW (ref 13.0–17.0)
Immature Granulocytes: 0 %
Lymphocytes Relative: 32 %
Lymphs Abs: 1.8 10*3/uL (ref 0.7–4.0)
MCH: 31.3 pg (ref 26.0–34.0)
MCHC: 32.4 g/dL (ref 30.0–36.0)
MCV: 96.4 fL (ref 80.0–100.0)
Monocytes Absolute: 0.6 10*3/uL (ref 0.1–1.0)
Monocytes Relative: 10 %
Neutro Abs: 3.2 10*3/uL (ref 1.7–7.7)
Neutrophils Relative %: 58 %
Platelets: 326 10*3/uL (ref 150–400)
RBC: 2.24 MIL/uL — ABNORMAL LOW (ref 4.22–5.81)
RDW: 16.2 % — ABNORMAL HIGH (ref 11.5–15.5)
WBC: 5.6 10*3/uL (ref 4.0–10.5)
nRBC: 0.4 % — ABNORMAL HIGH (ref 0.0–0.2)

## 2019-10-28 LAB — GLUCOSE, CAPILLARY
Glucose-Capillary: 105 mg/dL — ABNORMAL HIGH (ref 70–99)
Glucose-Capillary: 108 mg/dL — ABNORMAL HIGH (ref 70–99)
Glucose-Capillary: 69 mg/dL — ABNORMAL LOW (ref 70–99)
Glucose-Capillary: 85 mg/dL (ref 70–99)
Glucose-Capillary: 88 mg/dL (ref 70–99)
Glucose-Capillary: 90 mg/dL (ref 70–99)
Glucose-Capillary: 92 mg/dL (ref 70–99)

## 2019-10-28 MED ORDER — DEXTROSE 50 % IV SOLN
12.5000 g | Freq: Once | INTRAVENOUS | Status: AC
  Administered 2019-10-28: 12.5 g via INTRAVENOUS

## 2019-10-28 MED ORDER — VALPROIC ACID 250 MG/5ML PO SOLN
500.0000 mg | Freq: Two times a day (BID) | ORAL | Status: DC
  Administered 2019-10-29 – 2019-11-01 (×9): 500 mg
  Filled 2019-10-28 (×11): qty 10

## 2019-10-28 MED ORDER — CHLORHEXIDINE GLUCONATE 0.12 % MT SOLN
OROMUCOSAL | Status: AC
  Filled 2019-10-28: qty 15

## 2019-10-28 MED ORDER — DEXTROSE 50 % IV SOLN
INTRAVENOUS | Status: AC
  Filled 2019-10-28: qty 50

## 2019-10-28 NOTE — Plan of Care (Signed)
  Problem: Skin Integrity: Goal: Risk for impaired skin integrity will decrease Outcome: Progressing   
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
## 2019-10-29 ENCOUNTER — Inpatient Hospital Stay (HOSPITAL_COMMUNITY): Payer: Medicare Other

## 2019-10-29 LAB — GLUCOSE, CAPILLARY
Glucose-Capillary: 83 mg/dL (ref 70–99)
Glucose-Capillary: 82 mg/dL (ref 70–99)
Glucose-Capillary: 80 mg/dL (ref 70–99)
Glucose-Capillary: 83 mg/dL (ref 70–99)
Glucose-Capillary: 100 mg/dL — ABNORMAL HIGH (ref 70–99)

## 2019-10-29 LAB — PREPARE RBC (CROSSMATCH)

## 2019-10-29 LAB — CBC
HCT: 20.8 % — ABNORMAL LOW (ref 39.0–52.0)
Hemoglobin: 6.5 g/dL — CL (ref 13.0–17.0)
MCH: 31.6 pg (ref 26.0–34.0)
MCHC: 31.3 g/dL (ref 30.0–36.0)
MCV: 101 fL — ABNORMAL HIGH (ref 80.0–100.0)
Platelets: 353 10*3/uL (ref 150–400)
RBC: 2.06 MIL/uL — ABNORMAL LOW (ref 4.22–5.81)
RDW: 16.7 % — ABNORMAL HIGH (ref 11.5–15.5)
WBC: 6.3 10*3/uL (ref 4.0–10.5)
nRBC: 0 % (ref 0.0–0.2)

## 2019-10-29 LAB — VITAMIN B12: Vitamin B-12: 872 pg/mL (ref 180–914)

## 2019-10-29 LAB — RETICULOCYTES
Immature Retic Fract: 22.8 % — ABNORMAL HIGH (ref 2.3–15.9)
RBC.: 2.13 MIL/uL — ABNORMAL LOW (ref 4.22–5.81)
Retic Count, Absolute: 94.1 10*3/uL (ref 19.0–186.0)
Retic Ct Pct: 4.4 % — ABNORMAL HIGH (ref 0.4–3.1)

## 2019-10-29 LAB — FERRITIN: Ferritin: 373 ng/mL — ABNORMAL HIGH (ref 24–336)

## 2019-10-29 LAB — IRON AND TIBC
Iron: 32 ug/dL — ABNORMAL LOW (ref 45–182)
Saturation Ratios: 19 % (ref 17.9–39.5)
TIBC: 172 ug/dL — ABNORMAL LOW (ref 250–450)
UIBC: 140 ug/dL

## 2019-10-29 LAB — FOLATE: Folate: 18.3 ng/mL (ref >5.9)

## 2019-10-29 MED ORDER — ACETAMINOPHEN 325 MG PO TABS: 650.0000 mg | ORAL_TABLET | Freq: Four times a day (QID) | ORAL | Status: DC | PRN

## 2019-10-29 MED ORDER — ACETAMINOPHEN 160 MG/5ML PO SOLN
650.0000 mg | Freq: Four times a day (QID) | ORAL | Status: DC | PRN
  Administered 2019-11-03 – 2019-11-05 (×4): 650 mg
  Filled 2019-10-29 (×4): qty 20.3

## 2019-10-29 MED ORDER — SODIUM CHLORIDE 0.9% IV SOLUTION: Freq: Once | INTRAVENOUS | Status: AC

## 2019-10-29 MED ORDER — FUROSEMIDE 10 MG/ML IJ SOLN
20.0000 mg | Freq: Once | INTRAMUSCULAR | Status: AC
  Administered 2019-10-29: 20 mg via INTRAVENOUS
  Filled 2019-10-29: qty 2

## 2019-10-29 MED ORDER — ACETAMINOPHEN 325 MG PO TABS: 650.0000 mg | ORAL_TABLET | Freq: Once | ORAL | Status: DC

## 2019-10-29 MED ORDER — ACETAMINOPHEN 160 MG/5ML PO SOLN
650.0000 mg | Freq: Once | ORAL | Status: AC
  Administered 2019-10-29: 650 mg via ORAL
  Filled 2019-10-29: qty 20.3

## 2019-10-29 MED ORDER — CHLORHEXIDINE GLUCONATE 0.12 % MT SOLN
OROMUCOSAL | Status: AC
  Filled 2019-10-29: qty 15
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
## 2019-10-30 ENCOUNTER — Inpatient Hospital Stay (HOSPITAL_COMMUNITY): Payer: Medicare Other

## 2019-10-30 LAB — CBC WITH DIFFERENTIAL/PLATELET
Abs Immature Granulocytes: 0 10*3/uL (ref 0.00–0.07)
Basophils Absolute: 0.1 10*3/uL (ref 0.0–0.1)
Basophils Relative: 2 %
Eosinophils Absolute: 0 10*3/uL (ref 0.0–0.5)
Eosinophils Relative: 0 %
HCT: 24.7 % — ABNORMAL LOW (ref 39.0–52.0)
Hemoglobin: 7.6 g/dL — ABNORMAL LOW (ref 13.0–17.0)
Lymphocytes Relative: 33 %
Lymphs Abs: 1.9 10*3/uL (ref 0.7–4.0)
MCH: 30.2 pg (ref 26.0–34.0)
MCHC: 30.8 g/dL (ref 30.0–36.0)
MCV: 98 fL (ref 80.0–100.0)
Monocytes Absolute: 0.5 10*3/uL (ref 0.1–1.0)
Monocytes Relative: 8 %
Neutro Abs: 3.4 10*3/uL (ref 1.7–7.7)
Neutrophils Relative %: 57 %
Platelets: 367 10*3/uL (ref 150–400)
RBC: 2.52 MIL/uL — ABNORMAL LOW (ref 4.22–5.81)
RDW: 16.4 % — ABNORMAL HIGH (ref 11.5–15.5)
WBC: 5.9 10*3/uL (ref 4.0–10.5)
nRBC: 0 % (ref 0.0–0.2)
nRBC: 0 /100 WBC

## 2019-10-30 LAB — TYPE AND SCREEN
ABO/RH(D): O POS
Antibody Screen: NEGATIVE
Unit division: 0

## 2019-10-30 LAB — GLUCOSE, CAPILLARY
Glucose-Capillary: 104 mg/dL — ABNORMAL HIGH (ref 70–99)
Glucose-Capillary: 112 mg/dL — ABNORMAL HIGH (ref 70–99)
Glucose-Capillary: 121 mg/dL — ABNORMAL HIGH (ref 70–99)
Glucose-Capillary: 61 mg/dL — ABNORMAL LOW (ref 70–99)
Glucose-Capillary: 78 mg/dL (ref 70–99)
Glucose-Capillary: 83 mg/dL (ref 70–99)
Glucose-Capillary: 89 mg/dL (ref 70–99)

## 2019-10-30 LAB — BPAM RBC
Blood Product Expiration Date: 202102032359
ISSUE DATE / TIME: 202101031554
Unit Type and Rh: 5100

## 2019-10-30 MED ORDER — PRO-STAT SUGAR FREE PO LIQD
30.0000 mL | Freq: Two times a day (BID) | ORAL | Status: DC
  Administered 2019-10-30 – 2019-11-01 (×6): 30 mL
  Filled 2019-10-30 (×5): qty 30

## 2019-10-30 MED ORDER — OSMOLITE 1.2 CAL PO LIQD
1000.0000 mL | ORAL | Status: DC
  Administered 2019-10-30: 1000 mL
  Filled 2019-10-30 (×7): qty 1000

## 2019-10-30 NOTE — Progress Notes (Addendum)
Nutrition Follow-up  DOCUMENTATION CODES:   Severe malnutrition in context of social or environmental circumstances, Underweight  INTERVENTION:   Finish liter bottle of Vital AF 1.2 @ 65 ml/hr (1560 ml)  Transition to:  -Osmolite 1.2 @ 60 ml/hr via Cortrak (1440 ml)  -30 ml Prostat BID -Free water flushes 250 ml Q4 hours- per primary   Provides: 1928 kcals, 110 grams protein, 1181 ml free water (2681 ml with flushes). Meets 100% of needs.   NUTRITION DIAGNOSIS:   Severe Malnutrition related to social / environmental circumstances(developmental delay) as evidenced by severe fat depletion, severe muscle depletion, percent weight loss(26.5% weight loss within the past year).  Ongoing  GOAL:   Patient will meet greater than or equal to 90% of their needs  Addressed via TF  MONITOR:   Vent status, TF tolerance, Labs, Skin, Weight trends  REASON FOR ASSESSMENT:   Consult Enteral/tube feeding initiation and management  ASSESSMENT:  44 yo male admitted with septic shock, severe dehydration, severe hypernatremia, suspected source UTI. Recent several days of decreased oral intake, decreased mental status. 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  12/24-CT brain- no intercranial hemorrhage 12/27-extubated  12/28- gastric Cortrak placed   SLP saw pt today. Remains NPO- not a great candidate for swallowing therapy given cognitive impairments. He is tolerating Vital AF @ 65 ml/hr via Cortrak. Having normal BMs. MD to discuss PEG placement with family. Plan transition to standard formula today. RN made aware.   Pt is bed bound. Noted new wounds.   Admission weight: 52.6 kg (use as EDW) Current weight: 70.3 kg (trending down from 77.2 kg on 12/26)   I/O: -4,428 ml since 12/21  UOP: 4,300 ml x 24 hrs    Medications: SS novolog, imodium  Labs: CBG 69-121 Na 146 (H) K 3.2 (L)   Diet Order:   Diet Order            Diet  NPO time specified  Diet effective now              EDUCATION NEEDS:   No education needs have been identified at this time  Skin:  Skin Assessment: Skin Integrity Issues: Skin Integrity Issues:: DTI, Stage II DTI: sacrum Stage II: L arm, R ischial tuberosity, sacrum Stage III: n/a  Last BM:  1/3   Height:   Ht Readings from Last 1 Encounters:  10/14/19 5\' 11"  (1.803 m)    Weight:   Wt Readings from Last 1 Encounters:  10/25/19 70.3 kg    Ideal Body Weight:  78.2 kg  BMI:  Body mass index is 21.62 kg/m.  Estimated Nutritional Needs:   Kcal:  1750-1950 kcal  Protein:  100-120 grams  Fluid:  >/= 1.7 L/day   Mariana Single RD, LDN Clinical Nutrition Pager # - [phone]
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
## 2019-10-30 NOTE — Progress Notes (Signed)
Modified Barium Swallow Progress Note  Patient Details  Name: [name] MRN: [id number] Date of Birth: 1975/12/03  Today's Date: 10/30/2019  Modified Barium Swallow completed.  Full report located under Chart Review in the Imaging Section.  Brief recommendations include the following:  Clinical Impression  Pt presents with severe oropharyngeal dysphagia c/b decreased lingual coordination, premature spillage, delayed swallow initiation, reduced base of tongue retraction, incomplete laryngeal closure, and diminished sensation.  These deficits resulted in silent aspiration of all consistencies trialed.  Aspiration of liquids typically occured during the swallow, while there was suspected aspiration of puree and simulated ground consistency residuals after the swallow.  There was a very infrequent cough response during this assessment.  Both reflexive and cued cough were insufficient to clear aspiration or penetration.  Following liquid bolus with heavier puree bolus was not consistently beneficial at clearing penetration.  Pt appears to have difficulty with secretion management, and although secretions could not be visualized 2/2 MBSS, suspect that secretions are mixing with residuals, thinning out thicker consistencies, and contributing to aspiration.  This appears to be a progression of chronic dysphagia which was noted in MBSS in September with silent aspiration.  Recommend long term alternate means of nutrition; unfortunately, given secretion management issues, pt may remain at risk for aspiration of secretions and possible aspiration pneumonia even with PEG placement.  Recommend frequent, thorough oral care to reduce risk of aspiration pneumonia.  SLP will continue to follow for trial swallowing therapy.  Pt may not be a good candidate for swallowing therapy 2/2 cognitive impairments.   Swallow Evaluation Recommendations       SLP Diet Recommendations: NPO;Alternative means - long-term                                 Celedonio Savage, MA, [place] Office: [phone]  10/30/2019,10:42 AM

## 2019-10-30 NOTE — Care Management Important Message (Signed)
Important Message  Patient Details  Name: [name] MRN: [id number] Date of Birth: [birth year]   Medicare Important Message Given:  Yes     [name] [address] 10/30/2019, 3:54 PM

## 2019-10-31 LAB — CBC WITH DIFFERENTIAL/PLATELET
Abs Immature Granulocytes: 0.02 10*3/uL (ref 0.00–0.07)
Basophils Absolute: 0 10*3/uL (ref 0.0–0.1)
Basophils Relative: 1 %
Eosinophils Absolute: 0 10*3/uL (ref 0.0–0.5)
Eosinophils Relative: 0 %
HCT: 26.1 % — ABNORMAL LOW (ref 39.0–52.0)
Hemoglobin: 8.1 g/dL — ABNORMAL LOW (ref 13.0–17.0)
Immature Granulocytes: 1 %
Lymphocytes Relative: 37 %
Lymphs Abs: 1.4 10*3/uL (ref 0.7–4.0)
MCH: 30.6 pg (ref 26.0–34.0)
MCHC: 31 g/dL (ref 30.0–36.0)
MCV: 98.5 fL (ref 80.0–100.0)
Monocytes Absolute: 0.4 10*3/uL (ref 0.1–1.0)
Monocytes Relative: 11 %
Neutro Abs: 1.9 10*3/uL (ref 1.7–7.7)
Neutrophils Relative %: 50 %
Platelets: 338 10*3/uL (ref 150–400)
RBC: 2.65 MIL/uL — ABNORMAL LOW (ref 4.22–5.81)
RDW: 16.1 % — ABNORMAL HIGH (ref 11.5–15.5)
WBC: 3.8 10*3/uL — ABNORMAL LOW (ref 4.0–10.5)
nRBC: 0 % (ref 0.0–0.2)

## 2019-10-31 LAB — BASIC METABOLIC PANEL
Anion gap: 10 (ref 5–15)
BUN: 16 mg/dL (ref 6–20)
CO2: 27 mmol/L (ref 22–32)
Calcium: 8.4 mg/dL — ABNORMAL LOW (ref 8.9–10.3)
Chloride: 106 mmol/L (ref 98–111)
Creatinine, Ser: 0.41 mg/dL — ABNORMAL LOW (ref 0.61–1.24)
GFR calc Af Amer: >60 mL/min (ref >60)
GFR calc non Af Amer: >60 mL/min (ref >60)
Glucose, Bld: 103 mg/dL — ABNORMAL HIGH (ref 70–99)
Potassium: 4.2 mmol/L (ref 3.5–5.1)
Sodium: 143 mmol/L (ref 135–145)

## 2019-10-31 LAB — GLUCOSE, CAPILLARY
Glucose-Capillary: 102 mg/dL — ABNORMAL HIGH (ref 70–99)
Glucose-Capillary: 111 mg/dL — ABNORMAL HIGH (ref 70–99)
Glucose-Capillary: 113 mg/dL — ABNORMAL HIGH (ref 70–99)
Glucose-Capillary: 62 mg/dL — ABNORMAL LOW (ref 70–99)
Glucose-Capillary: 81 mg/dL (ref 70–99)
Glucose-Capillary: 99 mg/dL (ref 70–99)
Glucose-Capillary: 99 mg/dL (ref 70–99)

## 2019-10-31 MED ORDER — DEXTROSE 50 % IV SOLN
12.5000 g | INTRAVENOUS | Status: AC
  Administered 2019-10-31: 12.5 g via INTRAVENOUS

## 2019-10-31 MED ORDER — DEXTROSE 50 % IV SOLN
INTRAVENOUS | Status: AC
  Filled 2019-10-31: qty 50

## 2019-10-31 MED ORDER — CHLORHEXIDINE GLUCONATE 0.12 % MT SOLN
OROMUCOSAL | Status: AC
  Filled 2019-10-31: qty 15

## 2019-10-31 NOTE — Consult Note (Signed)
[place] Nurse wound follow up Patient receiving care in [facility] 5M08. Wound type: All wounds to the sacrum, right buttock, left medial heel and left ischial tuberosity are healed. Dressing procedure/placement/frequency:  Continue the use of foam dressings and heel lift boots, as well as the low air loss mattress. Val Riles, RN, MSN, CWOCN, CNS-BC, pager [phone]
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
## 2019-10-31 NOTE — Progress Notes (Signed)
Physical Therapy Treatment Patient Details Name: [name] MRN: [id number] DOB: [birth year] Today's Date: 10/31/2019    History of Present Illness Pt is a 44 y/o male with PMH of childhoold brain tumor, developmental delay, seizure disorder. Admitted 12/19 found in septic shock, severe dehydration with severe hypernatremia, with suspected UTI. Intubated 12/20-12/27.    PT Comments    Pt was seen for mobility with repositioning on the bed, to midline alignment with mainly concern for head and NG tube.  Pt is tolerating well and assisted PT to do ROM to his LUE and allowed LE movement BLE's.  He is alert when PT is talking with him, and nodded appropriately at the end of the session when PT told him goodbye.  Follow acutely to work on active LUE use, to avoid contractures worsening on RUE and maintain as much strength and mobility on LE's to increase his tolerance for being OOB to chair in SNF.    Follow Up Recommendations  No PT follow up     Equipment Recommendations  None recommended by PT    Recommendations for Other Services       Precautions / Restrictions Precautions Precautions: Fall Precaution Comments: use care on L side of head, surgery from childhood Restrictions Weight Bearing Restrictions: No    Mobility  Bed Mobility Overal bed mobility: Needs Assistance Bed Mobility: (repositioning)           General bed mobility comments: pt is sidelying nearly when PT arrived, repositioned in midline alignment and with head supported by lateral pillow on L side of head to avoid L SB fixed posture  Transfers                 General transfer comment: maxi move wiht nursing yesterday  Ambulation/Gait                 Stairs             Wheelchair Mobility    Modified Rankin (Stroke Patients Only)       Balance                                            Cognition Arousal/Alertness: Lethargic Behavior During Therapy:  Flat affect Overall Cognitive Status: History of cognitive impairments - at baseline                                 General Comments: able to follow commands, nods to questions and as PT was saying good bye at end of session      Exercises General Exercises - Upper Extremity Shoulder Flexion: AAROM;Left;10 reps Shoulder Extension: AAROM;Left;10 reps Shoulder ABduction: AAROM;Left;10 reps Elbow Flexion: AAROM;Left;10 reps Elbow Extension: AAROM;Left;10 reps General Exercises - Lower Extremity Ankle Circles/Pumps: PROM;5 reps Heel Slides: PROM;10 reps Hip ABduction/ADduction: PROM;10 reps Hip Flexion/Marching: PROM;10 reps    General Comments        Pertinent Vitals/Pain Pain Assessment: Faces Faces Pain Scale: Hurts little more Pain Location: RUE to attempt ROM Pain Descriptors / Indicators: Grimacing;Guarding Pain Intervention(s): Limited activity within patient's tolerance;Monitored during session;Repositioned    Home Living                      Prior Function  PT Goals (current goals can now be found in the care plan section) Acute Rehab PT Goals Patient Stated Goal: none stated PT Goal Formulation: Patient unable to participate in goal setting Progress towards PT goals: Progressing toward goals    Frequency    Min 2X/week      PT Plan Current plan remains appropriate    Co-evaluation              AM-PAC PT "6 Clicks" Mobility   Outcome Measure  Help needed turning from your back to your side while in a flat bed without using bedrails?: Total Help needed moving from lying on your back to sitting on the side of a flat bed without using bedrails?: Total Help needed moving to and from a bed to a chair (including a wheelchair)?: Total Help needed standing up from a chair using your arms (e.g., wheelchair or bedside chair)?: Total Help needed to walk in hospital room?: Total Help needed climbing 3-5 steps with a  railing? : Total 6 Click Score: 6    End of Session   Activity Tolerance: Patient limited by fatigue;Treatment limited secondary to medical complications (Comment) Patient left: in bed;with call bell/phone within reach;with bed alarm set Nurse Communication: Mobility status PT Visit Diagnosis: Other abnormalities of gait and mobility (R26.89)     Time: [id number] PT Time Calculation (min) (ACUTE ONLY): 27 min  Charges:  $Therapeutic Exercise: 8-22 mins $Therapeutic Activity: 8-22 mins                   Ramond Dial 10/31/2019, 3:33 PM   Mee Hives, PT MS Acute Rehab Dept. Number: [place] [facility] [employer]

## 2019-11-01 DIAGNOSIS — J690 Pneumonitis due to inhalation of food and vomit: Secondary | ICD-10-CM

## 2019-11-01 DIAGNOSIS — R1312 Dysphagia, oropharyngeal phase: Secondary | ICD-10-CM

## 2019-11-01 LAB — GLUCOSE, CAPILLARY
Glucose-Capillary: 109 mg/dL — ABNORMAL HIGH (ref 70–99)
Glucose-Capillary: 80 mg/dL (ref 70–99)
Glucose-Capillary: 84 mg/dL (ref 70–99)
Glucose-Capillary: 86 mg/dL (ref 70–99)
Glucose-Capillary: 86 mg/dL (ref 70–99)
Glucose-Capillary: 88 mg/dL (ref 70–99)
Glucose-Capillary: 91 mg/dL (ref 70–99)

## 2019-11-01 MED ORDER — ENOXAPARIN SODIUM 40 MG/0.4ML ~~LOC~~ SOLN
40.0000 mg | SUBCUTANEOUS | Status: DC
  Administered 2019-11-03 – 2019-11-05 (×3): 40 mg via SUBCUTANEOUS
  Filled 2019-11-01 (×4): qty 0.4

## 2019-11-01 MED ORDER — ENOXAPARIN SODIUM 40 MG/0.4ML ~~LOC~~ SOLN
40.0000 mg | SUBCUTANEOUS | Status: DC
  Administered 2019-11-01: 40 mg via SUBCUTANEOUS
  Filled 2019-11-01: qty 0.4

## 2019-11-01 NOTE — Consult Note (Signed)
Chief Complaint: Patient was seen in consultation today for dysphagia/gastrostomy tube placement.  Referring Physician(s): Domenic Polite  Supervising Physician: Jacqulynn Cadet  Patient Status: [facility] - In-pt  History of Present Illness: [name] is a 44 y.o. 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 He presented to [facility] ED from his group home via EMS for management of AMS (lethargy, hypoxia, poor PO intake x several days). He was found to be septic (likely from aspiration pneumonia), and was admitted for further management. He was evaluated by ST who states he has significantly worsened swallowing not safe for oral intake and at risk of aspiration.  IR requested by Dr. Broadus John for possible image-guided percutaneous gastrostomy tube placement. Case has been reviewed by Dr. Laurence Ferrari who agrees for procedure. Patient awake and alert laying in bed watching TV. Accompanied by brother, [name], at bedside. Patient able to converse with me today, denies complaints.   Past Medical History:  Diagnosis Date  . Brain tumor (benign) ([place])   . Hypertension   . Mental retardation, mild (I.Q. 50-70)   . Seizures ([place])     Past Surgical History:  Procedure Laterality Date  . BRAIN SURGERY      Allergies: Patient has no known allergies.  Medications: Prior to Admission medications   Medication Sig Start Date End Date Taking? Authorizing Provider  divalproex (DEPAKOTE) 500 MG DR tablet Take 1,000 mg by mouth 2 (two) times daily. 09/20/19  Yes [provider]  feeding supplement, ENSURE ENLIVE, (ENSURE ENLIVE) LIQD Take 237 mLs by mouth 3 (three) times daily between meals. 08/02/19  Yes Rory Percy, DO  olmesartan-hydrochlorothiazide (BENICAR HCT) 20-12.5 MG tablet Take 1 tablet by mouth daily. 09/20/19  Yes [provider]  divalproex (DEPAKOTE SPRINKLE)  125 MG capsule Take 4 capsules (500 mg total) by mouth 4 (four) times daily. Patient not taking: Reported on 10/15/2019 08/02/19   Rory Percy, DO     Family History  Problem Relation Age of Onset  . Diabetes Mother   . Hypertension Mother   . Atrial fibrillation Mother   . Prostate cancer Father   . Multiple sclerosis Sister        Deceased  . Multiple sclerosis Sister        Deceased  . Hypertension Sister     Social History   Socioeconomic History  . Marital status: Single    Spouse name: Not on file  . Number of children: 2  . Years of education: HS  . Highest education level: Not on file  Occupational History  . Occupation: Disabled  Tobacco Use  . Smoking status: Never Smoker  . Smokeless tobacco: Never Used  Substance and Sexual Activity  . Alcohol use: No  . Drug use: No  . Sexual activity: Not on file  Other Topics Concern  . Not on file  Social History Narrative   Lives at home with his mother and his sister.   Left-handed.   No caffeine use.   Social Determinants of Health   Financial Resource Strain:   . Difficulty of Paying Living Expenses: Not on file  Food Insecurity:   . Worried About Charity fundraiser in the Last Year: Not on file  . Ran Out of Food in the Last Year: Not on file  Transportation Needs:   . Lack of Transportation (Medical): Not on file  . Lack of Transportation (Non-Medical):  Not on file  Physical Activity:   . Days of Exercise per Week: Not on file  . Minutes of Exercise per Session: Not on file  Stress:   . Feeling of Stress : Not on file  Social Connections:   . Frequency of Communication with Friends and Family: Not on file  . Frequency of Social Gatherings with Friends and Family: Not on file  . Attends Religious Services: Not on file  . Active Member of Clubs or Organizations: Not on file  . Attends Archivist Meetings: Not on file  . Marital Status: Not on file     Review of Systems: A 12 point ROS  discussed and pertinent positives are indicated in the HPI above.  All other systems are negative.  Review of Systems  Unable to perform ROS: Mental status change    Vital Signs: BP 97/67 (BP Location: Left Arm)   Pulse 97   Temp 98.5 F (36.9 C) (Oral)   Resp 18   Ht 5\' 11"  (1.803 m)   Wt 155 lb (70.3 kg)   SpO2 94%   BMI 21.62 kg/m   Physical Exam Vitals and nursing note reviewed.  Constitutional:      General: He is not in acute distress.    Appearance: Normal appearance.  HENT:     Nose:     Comments: (+) NG tube. Cardiovascular:     Rate and Rhythm: Normal rate and regular rhythm.     Heart sounds: Normal heart sounds. No murmur.  Pulmonary:     Effort: Pulmonary effort is normal. No respiratory distress.     Breath sounds: Normal breath sounds. No wheezing.  Skin:    General: Skin is warm and dry.  Neurological:     Mental Status: He is alert.      MD Evaluation Airway: WNL Heart: WNL Abdomen: WNL Chest/ Lungs: WNL ASA  Classification: 3 Mallampati/Airway Score: Two   Imaging: CT HEAD WO CONTRAST  Result Date: 10/19/2019 CLINICAL DATA:  History of seizures and brain tumor resection. EXAM: CT HEAD WITHOUT CONTRAST TECHNIQUE: Contiguous axial images were obtained from the base of the skull through the vertex without intravenous contrast. COMPARISON:  Head CT 10/15/2019 FINDINGS: Brain: Postsurgical changes of the left parietal lobe with unchanged position of shunt catheter. No ventricular dilatation. The lateral ventricles are slightly decompressed compared to the prior study. No intracranial hemorrhage. There is mild leftward midline shift of the septum pellucidum, which may be due to decreased left-sided pressure in the presence of craniectomy defect. Vascular: No hyperdense vessel or unexpected calcification. Skull: Large left-sided craniectomy defect. Sinuses/Orbits: Mild mucosal thickening in the left maxillary sinus. Normal orbits. Other: Unchanged  posterior intracranial abandoned catheter fragment. IMPRESSION: Slightly decreased size of the lateral ventricles with mild leftward deviation of the septum pellucidum. No intracranial hemorrhage. Electronically Signed   By: Ulyses Jarred M.D.   On: 10/19/2019 00:47   CT HEAD WO CONTRAST  Result Date: 10/15/2019 CLINICAL DATA:  Initial evaluation for acute encephalopathy, decreased level of consciousness. EXAM: CT HEAD WITHOUT CONTRAST TECHNIQUE: Contiguous axial images were obtained from the base of the skull through the vertex without intravenous contrast. COMPARISON:  Comparison made with prior CT from 07/24/2019 as well as earlier studies. FINDINGS: Brain: Postoperative changes from prior left frontotemporal and parietal craniectomy with extensive underlying left temporoparietal encephalomalacia again seen, stable. A left parietal approach shunt catheter remains in stable position with tip terminating near the atrium of the left lateral  ventricle. Additional orphaned shunt catheter seen at the posterior left parietal convexity. Large area of cystic encephalomalacia again seen communicating with the atrium/occipital horn of the left lateral ventricle, stable. Since previous exam, the cystic cavity is increased in size, with increased protrusion through the craniectomy defect as compared to prior (series 3, image 18). Finding raises the possibility for possible shunt malfunction. Ventricular size itself is relatively stable without hydrocephalus. Trace right-to-left midline shift related to volume loss is relatively unchanged. Underlying cerebral atrophy noted, stable. No acute intracranial hemorrhage. No acute large vessel territory infarct. No extra-axial fluid collection. Vascular: No hyperdense vessel. Scattered vascular calcifications noted within the carotid siphons. Skull: Scalp soft tissues demonstrate no acute finding. Sequelae of prior left craniectomy. Shunt catheter within the left posterior  scalp. Sinuses/Orbits: Globes and orbital soft tissues demonstrate no acute finding. Chronic mucoperiosteal thickening noted within the ethmoidal air cells and left maxillary sinus. Paranasal sinuses are otherwise clear. Mastoid air cells and middle ear cavities are well pneumatized and free of fluid. Other: None. IMPRESSION: 1. Postoperative changes from prior left frontotemporal and parietal craniectomy with underlying encephalomalacia of and left parietal approach shunt catheter in place. There is increased size of an area of cystic encephalomalacia of within the left parietotemporal region, with increased protrusion at the craniectomy defect as compared to previous. Finding raises the possibility for possible shunt malfunction. No hydrocephalus. 2. Otherwise stable appearance of the brain with no other acute intracranial abnormality identified. 3. Chronic ethmoidal and maxillary sinusitis. Electronically Signed   By: Jeannine Boga M.D.   On: 10/15/2019 01:19   MR BRAIN WO CONTRAST  Result Date: 10/20/2019 CLINICAL DATA:  44 year old male with chronic CSF shunt, history of brain tumor resection as a child. Recent encephalopathy. Pupillary change - ophthalmoplegia. EXAM: MRI HEAD WITHOUT CONTRAST TECHNIQUE: Multiplanar, multiecho pulse sequences of the brain and surrounding structures were obtained without intravenous contrast. COMPARISON:  Recent head CTs 10/19/2019 and earlier. Brain MRI 12/16/2006. FINDINGS: Brain: Left posterior hemisphere susceptibility artifact. No restricted diffusion or evidence of acute infarction. Lateral and 3rd ventricle size has substantially increased since 2008, although stable since September this year. A 2018 head CT had a somewhat intermediate ventricle size, suggesting the change has been gradual. The cerebral aqueduct remains small. The 4th ventricle has also increased although not as much. No transependymal edema suspected. There was some abnormal right cerebellar  peduncle region signal on the 2008 MRI, but there is now widespread abnormal T2 and FLAIR signal in the bilateral cerebellar peduncles, dorsal brainstem (series 15, image 8). There is also asymmetric new signal abnormality in the pons, including in the midline (image 9). No definite superimposed chronic blood products. Overall severe brainstem atrophy since 2008 (compare sagittal series 14, image 13 today to series 2, image 6 in 2008. Some superimposed Wallerian degeneration from the chronic left hemisphere findings is noted although appears substantially progressed since 2018 (asymmetric midbrain volume now on image 11). There is also progressed and fairly widespread now nonspecific bilateral cerebral white matter T2 and FLAIR hyperintensity. On T2 this somewhat resembles multiple scattered chronic white matter lacune is a (series 15, image 17). Chronic posterior hemisphere encephalomalacia is superimposed and itself appears not significantly changed since 2008. Most of the left parietal, occipital, and posterior left temporal lobes are affected with a cystic cavity that communicates with the left lateral ventricle. Two shunts in the region appears stable since 2008 by MRI (series 15, images 17 and 15). The left thalamus and left internal  capsule has partially atrophied since 2008, but otherwise the deep gray nuclei appear spared. No midline shift, evidence of mass lesion, or acute intracranial hemorrhage. Pituitary within normal limits. Vascular: Poor appearance of the distal left vertebral artery flow void is new since 2,080. Other Major intracranial vascular flow voids appear stable since that time. Skull and upper cervical spine: Suggestion of some spinal cord atrophy since 2008 on the sagittal images. Otherwise negative visible cervical spine. Hyperostosis of the calvarium with normal bone marrow signal. Sinuses/Orbits: Disconjugate gaze. There is some chronic architectural distortion in the area of the optic  chiasm which is not clearly identified. Intraorbital soft tissues seen within normal limits. Bilateral paranasal sinus air-fluid levels with only mild mucosal thickening. Hyperplastic sinuses. Other: New bilateral mastoid effusions since 2008. The patient is intubated in there is some fluid layering in the pharynx. IMPRESSION: 1. There is evidence of distal Left Vertebral Artery poor flow or occlusion which is new since the 2008 MRI, although no acute or subacute infarct is identified. 2. Severe global brainstem atrophy since 2008, with new and widespread nonspecific abnormal brainstem and cerebellar peduncle T2/FLAIR signal. Probable associated spinal cord atrophy over the same time. Etiology is unclear. And sparing of most cerebellar parenchyma would seem to argue against an ischemic etiology secondary to #1. 3. Superimposed progressed bilateral cerebral white matter signal abnormality since 2008, nonspecific although on T2 somewhat resembles multiple chronic white matter lacunae. Partial atrophy of the left deep gray matter nuclei also since 2008. But the other chronic pronounced left hemisphere encephalomalacia seems stable. 4. Substantial ventricular enlargement since 2008, although likely gradual given the CT head appearance in 2018. No evidence of transependymal edema. Chronic CSF shunt noted. Electronically Signed   By: Genevie Ann M.D.   On: 10/20/2019 10:29   DG CHEST PORT 1 VIEW  Result Date: 10/29/2019 CLINICAL DATA:  Pneumonia. EXAM: PORTABLE CHEST 1 VIEW COMPARISON:  October 23, 2019. FINDINGS: The heart size and mediastinal contours are within normal limits. Feeding tube is seen entering the stomach. Left-sided ventriculoperitoneal shunt is again noted. No pneumothorax or pleural effusion is noted. Minimal bibasilar subsegmental atelectasis is noted. The visualized skeletal structures are unremarkable. IMPRESSION: Minimal bibasilar subsegmental atelectasis. Feeding tube is seen entering the stomach.  Electronically Signed   By: Marijo Conception M.D.   On: 10/29/2019 08:35   DG Chest Port 1 View  Result Date: 10/23/2019 CLINICAL DATA:  Recent respiratory failure. EXAM: PORTABLE CHEST 1 VIEW COMPARISON:  Chest x-ray 10/22/2019 FINDINGS: The endotracheal tube has been removed. The NG tube has been removed. The left subclavian central venous catheter is stable. 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 The cardiac silhouette, mediastinal and hilar contours are within normal limits and stable. Persistent streaky bibasilar atelectasis versus infiltrate and small pleural effusions. IMPRESSION: 1. Removal of ET and NG tubes. 2. Stable left subclavian central venous catheter. 3. Streaky bibasilar atelectasis versus infiltrates and small pleural effusions. Electronically Signed   By: Marijo Sanes M.D.   On: 10/23/2019 06:29   AM DG Chest Port 1 View  Result Date: 10/22/2019 CLINICAL DATA:  44 year old male with respiratory failure. Recent sepsis. Chronic CSF shunt. EXAM: PORTABLE CHEST 1 VIEW COMPARISON:  10/21/2019 portable chest and earlier. FINDINGS: Portable AP semi upright view at 0541 hours. Stable endotracheal tube, left subclavian central line and enteric tube. Superimposed CSF shunt coursing through the left chest. Scoliosis. Normal cardiac size and mediastinal contours. Stable lung volumes with improved right lung base ventilation,  now appearing clear. Continued veiling opacity at the left lung base is stable. No pulmonary edema or pneumothorax. Negative visible bowel gas pattern. IMPRESSION: 1.  Stable lines and tubes. 2. Normalized right lung base ventilation. Stable veiling opacity on the left suggesting a combination of pleural effusion and airspace disease. Electronically Signed   By: Genevie Ann M.D.   On: 10/22/2019 07:50   DG Chest Port 1 View  Result Date: 10/21/2019 CLINICAL DATA:  44 year old male with recent sepsis. Chronic CSF shunt, brain tumor resection as a child. Intubated. EXAM: PORTABLE  CHEST 1 VIEW COMPARISON:  Portable chest 10/20/2019 and earlier. FINDINGS: Portable AP semi upright view at 0540 hours. Endotracheal tube tip remains in good position between the level the clavicles and carina. Enteric tube in good position, side hole the level of the proximal gastric body. Stable left subclavian central line. Scoliosis. Mediastinal contours remain normal. Stable lung volumes. Left greater than right veiling lower lung opacity not significantly changed since 10/19/2019. Right lung ventilation has improved since 10/15/2019. No pneumothorax or pulmonary edema. IMPRESSION: 1.  Stable lines and tubes. 2. Continued left greater than right veiling lower lung opacity which might reflect a combination of pleural effusions and airspace disease due to either infection or atelectasis. Ventilation not significantly changed since 10/19/2019. Electronically Signed   By: Genevie Ann M.D.   On: 10/21/2019 08:30   DG Chest Port 1 View  Result Date: 10/20/2019 CLINICAL DATA:  Endotracheal tube placement. EXAM: PORTABLE CHEST 1 VIEW COMPARISON:  10/19/2019 FINDINGS: Patient is rotated to the left. Endotracheal tube has tip 4.8 cm above the carina. Enteric tube has tip over the stomach in the left upper quadrant and unchanged. Left subclavian central venous catheter has tip over the SVC. Left-sided VP shunt tubing unchanged. Lungs are adequately inflated and demonstrate hazy bilateral perihilar/bibasilar opacification without significant change in may be due to interstitial edema or infection. Likely small left effusion. Cardiomediastinal silhouette and remainder of the exam is unchanged. IMPRESSION: Stable hazy opacification over the perihilar bibasilar region which may be due to interstitial edema versus infection. Small left pleural effusion. Tubes and lines as described. Electronically Signed   By: Marin Olp M.D.   On: 10/20/2019 05:45   DG Chest Port 1 View  Result Date: 10/19/2019 CLINICAL DATA:   Respiratory failure. EXAM: PORTABLE CHEST 1 VIEW COMPARISON:  10/15/2019 FINDINGS: The endotracheal tube is 3.8 cm above the carina. The NG tube is coursing down the esophagus and into the stomach. Left subclavian central venous catheter is stable. A left-sided ventriculoperitoneal shunt catheter is again noted. The cardiac silhouette, mediastinal and hilar contours are stable. Persistent bibasilar infiltrates.  Suspect small left effusion. IMPRESSION: 1. Stable support apparatus. 2. Persistent bilateral lower lobe infiltrates. Electronically Signed   By: Marijo Sanes M.D.   On: 10/19/2019 06:50   DG CHEST PORT 1 VIEW  Result Date: 10/15/2019 CLINICAL DATA:  Endotracheal tube placement. EXAM: PORTABLE CHEST 1 VIEW COMPARISON:  Radiograph earlier this day at 0317 hour FINDINGS: Endotracheal tube tip 4.2 cm from the carina. Left subclavian central line remains in place. No significant change in right perihilar and infrahilar airspace opacities. Left infrahilar opacities have slightly increased. Unchanged heart size. No pneumothorax or pleural effusion. IMPRESSION: 1. Endotracheal tube tip 4.2 cm from the carina. 2. Unchanged right perihilar and infrahilar airspace opacities. Left infrahilar opacities have slightly increased. Findings suspicious for pneumonia. Electronically Signed   By: Keith Rake M.D.   On: 10/15/2019 06:10  DG CHEST PORT 1 VIEW  Result Date: 10/15/2019 CLINICAL DATA:  Central line placement. EXAM: PORTABLE CHEST 1 VIEW COMPARISON:  Radiograph yesterday. FINDINGS: Left subclavian central venous catheter tip projects over the mid SVC. No pneumothorax. 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 Worsening airspace disease in the right mid lower lung zones since prior. Developing patchy opacity at the left lung base. Unchanged heart size and mediastinal contours. No visualized pleural fluid. Scoliotic curvature of spine. IMPRESSION: 1. Left  subclavian central venous catheter tip projects over the mid SVC. No pneumothorax. 2. Worsening airspace disease in the right mid and lower lung zones. Developing patchy opacity at the left lung base. Findings suspicious for pneumonia. Electronically Signed   By: Keith Rake M.D.   On: 10/15/2019 03:31   DG Chest Port 1 View  Result Date: 10/14/2019 CLINICAL DATA:  Shortness of breath.  Unresponsive. EXAM: PORTABLE CHEST 1 VIEW COMPARISON:  07/30/2019 and prior radiographs FINDINGS: New airspace disease within the mid and lower RIGHT lung noted. Cardiomediastinal silhouette is unchanged. The LEFT lung is clear. No pleural effusion or pneumothorax. A VP shunt overlying the LEFT chest is again identified. IMPRESSION: New RIGHT lung airspace disease suspicious for pneumonia or possibly aspiration. Electronically Signed   By: Margarette Canada M.D.   On: 10/14/2019 20:42   DG Abd Portable 1V  Result Date: 10/15/2019 CLINICAL DATA:  Orogastric tube placement. EXAM: PORTABLE ABDOMEN - 1 VIEW COMPARISON:  None. FINDINGS: An orogastric tube is seen with tip overlying the proximal gastric body. The bowel gas pattern is normal. Catheter noted within the bladder. VP shunt tubing also seen with tip in the right lower quadrant. Patient is rotated to the right. IMPRESSION: Orogastric tube tip overlies the proximal gastric body. Normal bowel gas pattern. Electronically Signed   By: Marlaine Hind M.D.   On: 10/15/2019 18:00   DG Swallowing Func-Speech Pathology  Result Date: 10/30/2019 Objective Swallowing Evaluation: Type of Study: MBS-Modified Barium Swallow Study  Patient Details Name: NATION KACKLEY MRN: [id number] Date of Birth: 05/12/1976 Today's Date: 10/30/2019 Time: SLP Start Time (ACUTE ONLY): [id number] -SLP Stop Time (ACUTE ONLY): 0953 SLP Time Calculation (min) (ACUTE ONLY): 14 min Past Medical History: Past Medical History: Diagnosis Date . Brain tumor (benign) ([place])  . Hypertension  . Mental retardation, mild  (I.Q. 50-70)  . Seizures ([place])  Past Surgical History: Past Surgical History: Procedure Laterality Date . BRAIN SURGERY   HPI:  44 year old male with a childhood brain tumor and developmental delay as well as seizures disorder, admitted from home.  He presents with several days of decreased oral intake dehydration and decreased mentation and mental status from baseline.  Was admitted early the morning of 12/19 found to be in septic shock, severe dehydration with severe hypernatremia, with suspected source to be UTI.  He was intubated for worsening respiratory acidosis and clinical [name]. (ETT 12/20-27). CT brain 12/24 Slightly decreased size of the lateral ventricles with mild leftward.  Pt had MBS when admitted to [facility] in Sept 2020, found to have dysphagia with silent aspiration and dysphagia 1/nectar liquids was recommended.  Subjective: Awake, alert, participative Assessment / Plan / Recommendation CHL IP CLINICAL IMPRESSIONS 10/30/2019 Clinical Impression Pt presents with severe oropharyngeal dysphagia c/b decreased lingual coordination, premature spillage, delayed swallow initiation, reduced base of tongue retraction, incomplete laryngeal closure, and diminished sensation.  These deficits resulted in silent aspiration of all consistencies trialed.  Aspiration of liquids typically occured during the swallow,  while there was suspected aspiration of puree and simulated ground consistency residuals after the swallow.  There was a very infrequent cough response during this assessment.  Both reflexive and cued cough were insufficient to clear aspiration or penetration.  Following liquid bolus with heavier puree bolus was not consistently beneficial at clearing penetration.  Pt appears to have difficulty with secretion management, and although secretions could not be visualized 2/2 MBSS, suspect that secretions are mixing with residuals, thinning out thicker consistencies, and contributing to aspiration.  This appears to  be a progression of chronic dysphagia which was noted in MBSS in September with silent aspiration.  Recommend long term alternate means of nutrition; unfortunately, given secretion management issues, pt may remain at risk for aspiration of secretions and possible aspiration pneumonia even with PEG placement.  Recommend frequent, thorough oral care to reduce risk of aspiration pneumonia.  SLP will continue to follow for trial swallowing therapy.  Pt may not be a good candidate for swallowing therapy 2/2 cognitive impairments. SLP Visit Diagnosis Dysphagia, oropharyngeal phase (R13.12) Attention and concentration deficit following -- Frontal lobe and executive function deficit following -- Impact on safety and function Severe aspiration risk   CHL IP TREATMENT RECOMMENDATION 10/30/2019 Treatment Recommendations Therapy as outlined in treatment plan below   Prognosis 10/30/2019 Prognosis for Safe Diet Advancement Fair Barriers to Reach Goals Cognitive deficits;Severity of deficits;Time post onset Barriers/Prognosis Comment -- CHL IP DIET RECOMMENDATION 10/30/2019 SLP Diet Recommendations NPO;Alternative means - long-term Liquid Administration via -- Medication Administration -- Compensations -- Postural Changes --   CHL IP OTHER RECOMMENDATIONS 07/26/2019 Recommended Consults -- Oral Care Recommendations Oral care BID;Staff/trained caregiver to provide oral care Other Recommendations Order thickener from pharmacy   CHL IP FOLLOW UP RECOMMENDATIONS 10/30/2019 Follow up Recommendations (No Data)   CHL IP FREQUENCY AND DURATION 10/30/2019 Speech Therapy Frequency (ACUTE ONLY) min 2x/week Treatment Duration 2 weeks      CHL IP ORAL PHASE 10/30/2019 Oral Phase Impaired Oral - Pudding Teaspoon -- Oral - Pudding Cup -- Oral - Honey Teaspoon -- Oral - Honey Cup Decreased bolus cohesion;Premature spillage Oral - Nectar Teaspoon -- Oral - Nectar Cup Decreased bolus cohesion;Premature spillage Oral - Nectar Straw -- Oral - Thin Teaspoon --  Oral - Thin Cup Decreased bolus cohesion;Premature spillage Oral - Thin Straw -- Oral - Puree Decreased bolus cohesion;Premature spillage Oral - Mech Soft Decreased bolus cohesion;Premature spillage Oral - Regular -- Oral - Multi-Consistency -- Oral - Pill -- Oral Phase - Comment --  CHL IP PHARYNGEAL PHASE 10/30/2019 Pharyngeal Phase Impaired Pharyngeal- Pudding Teaspoon -- Pharyngeal -- Pharyngeal- Pudding Cup -- Pharyngeal -- Pharyngeal- Honey Teaspoon -- Pharyngeal -- Pharyngeal- Honey Cup Delayed swallow initiation-vallecula;Delayed swallow initiation-pyriform sinuses;Reduced airway/laryngeal closure;Reduced tongue base retraction;Penetration/Aspiration during swallow;Penetration/Apiration after swallow;Penetration/Aspiration before swallow;Trace aspiration;Pharyngeal residue - valleculae Pharyngeal Material enters airway, passes BELOW cords without attempt by patient to eject out (silent aspiration) Pharyngeal- Nectar Teaspoon -- Pharyngeal -- Pharyngeal- Nectar Cup Delayed swallow initiation-vallecula;Delayed swallow initiation-pyriform sinuses;Reduced airway/laryngeal closure;Reduced tongue base retraction;Penetration/Aspiration before swallow;Penetration/Aspiration during swallow;Trace aspiration Pharyngeal Material enters airway, passes BELOW cords without attempt by patient to eject out (silent aspiration) Pharyngeal- Nectar Straw -- Pharyngeal -- Pharyngeal- Thin Teaspoon -- Pharyngeal -- Pharyngeal- Thin Cup Delayed swallow initiation-pyriform sinuses;Reduced airway/laryngeal closure;Penetration/Aspiration during swallow Pharyngeal Material enters airway, passes BELOW cords without attempt by patient to eject out (silent aspiration) Pharyngeal- Thin Straw -- Pharyngeal -- Pharyngeal- Puree Delayed swallow initiation-pyriform sinuses;Reduced airway/laryngeal closure;Penetration/Aspiration during swallow;Penetration/Apiration after swallow Pharyngeal Material enters airway, passes  BELOW cords without  attempt by patient to eject out (silent aspiration) Pharyngeal- Mechanical Soft Delayed swallow initiation-vallecula;Reduced tongue base retraction;Reduced airway/laryngeal closure;Penetration/Apiration after swallow;Penetration/Aspiration during swallow Pharyngeal Material enters airway, passes BELOW cords without attempt by patient to eject out (silent aspiration) Pharyngeal- Regular -- Pharyngeal -- Pharyngeal- Multi-consistency -- Pharyngeal -- Pharyngeal- Pill -- Pharyngeal -- Pharyngeal Comment --  CHL IP CERVICAL ESOPHAGEAL PHASE 10/30/2019 Cervical Esophageal Phase WFL Pudding Teaspoon -- Pudding Cup -- Honey Teaspoon -- Honey Cup -- Nectar Teaspoon -- Nectar Cup -- Nectar Straw -- Thin Teaspoon -- Thin Cup -- Thin Straw -- Puree -- Mechanical Soft -- Regular -- Multi-consistency -- Pill -- Cervical Esophageal Comment -- Celedonio Savage, MA, CCC-SLP Acute Rehabilitation Services Office: [phone] 10/30/2019, 10:43 AM              EEG adult  Result Date: 10/21/2019 Greta Doom, MD     10/22/2019  7:44 AM History: 44 yo M being evaluated for AMS Sedation: None Technique: This is a 21 channel routine scalp EEG performed at the bedside with bipolar and monopolar montages arranged in accordance to the international 10/20 system of electrode placement. One channel was dedicated to EKG recording. Background: The background is obscured during most of the recording by myogenic artifact. That being said, there is some background visible at times with a low voltage posterior dominant rhythm of 8 Hz seen briefly. There is some generalized irregular low voltage delta range activity intruding into the background as well. There is no rhythmic or epileptiform appearing activity, wither during the periods of quiescence or underneath the muscle. No sleep was seen. Photic stimulation: Physiologic driving is none EEG Abnormalities: 1) Generalized irregular slow activity. 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 Though limited by muscle artifact, no seizure or seizure predisposition recorded on this study. Please note that lack of epileptiform activity on EEG does not preclude the possibility of epilepsy. Roland Rack, MD [facility] [phone] If 7pm- 7am, please page neurology on call as listed in [place].    Labs:  CBC: Recent Labs    10/28/19 0604 10/29/19 0826 10/30/19 0545 10/31/19 0847  WBC 5.6 6.3 5.9 3.8*  HGB 7.0* 6.5* 7.6* 8.1*  HCT 21.6* 20.8* 24.7* 26.1*  PLT 326 353 367 338    COAGS: Recent Labs    07/24/19 1014 10/14/19 2030 10/19/19 0837  INR 1.2 1.2 1.1  APTT 32 38*  --     BMP: Recent Labs    10/23/19 0351 10/25/19 0513 10/28/19 0604 10/31/19 0847  NA 145 145 146* 143  K 3.6 3.5 3.2* 4.2  CL 111 110 106 106  CO2 28 28 30 27   GLUCOSE 130* 97 97 103*  BUN 18 13 6 16   CALCIUM 7.5* 7.3* 7.6* 8.4*  CREATININE 0.49* 0.49* 0.42* 0.41*  GFRNONAA >60 >60 >60 >60  GFRAA >60 >60 >60 >60    LIVER FUNCTION TESTS: Recent Labs    07/24/19 1014 07/25/19 1207 07/26/19 0715 10/14/19 2000 10/20/19 0357 10/21/19 0336 10/22/19 0418  BILITOT 0.6 0.5 1.0 1.3*  --   --   --   AST 73* 78* 62* 54*  --   --   --   ALT 23 23 21 18   --   --   --   ALKPHOS 47 36* 32* 43  --   --   --  PROT 7.7 5.5* 5.0* 6.9  --   --   --   ALBUMIN 3.3* 2.3* 2.1* 2.7* 1.3* 1.4* 1.5*     Assessment and Plan:  Dysphagia. Plan for image-guided percutaneous gastrostomy tube placement tentatively for tomorrow in IR. Patient will be NPO at midnight- hold all tube feeds at midnight as well. Afebrile. WBCs trending down to 3/8, hgb trending up to 8.1- will recheck CBC in AM prior to proceeding per Dr. Anselm Pancoast. Last dose Lovenox today at 1330- ok to proceed tomorrow afternoon per IR  protocol. INR pending for 0500 tomorrow AM. COVID negative on admission.  Risks and benefits discussed with the patient including, but not limited to the need for a barium enema during the procedure, bleeding, infection, peritonitis, or damage to adjacent structures. Patient has a legal guardian- his mother, [name]. Attempted to call mother x2 for consent without answer. Spoke with patient's brother, [name], bedside who states his mother is currently in surgery at [facility] and that his/patient's sister, [name], or his other brother/patient's brother, [name] are who should be contacted regarding consent. Spoke with Hattie Perch via telephone who agrees for patient to proceed with procedure- consent signed and in IR control room.   Thank you for this interesting consult.  I greatly enjoyed meeting [name] and look forward to participating in their care.  A copy of this report was sent to the requesting provider on this date.  Electronically Signed: Earley Abide, PA-C 11/01/2019, 3:28 PM   I spent a total of 40 Minutes in face to face in clinical consultation, greater than 50% of which was counseling/coordinating care for dysphagia/gastrostomy tube placement.

## 2019-11-01 NOTE — Progress Notes (Signed)
  Speech Language Pathology Treatment: Dysphagia  Patient Details Name: [name] MRN: [id number] DOB: 03-10-1976 Today's Date: 11/01/2019 Time: [id number] SLP Time Calculation (min) (ACUTE ONLY): 11 min  Assessment / Plan / Recommendation Clinical Impression  Swallow treatment provided this day. SLP recieved pt in bed, very alert and pleasantly interacting with therapist. Pt's mentation appears to be clearing and he was able to follow basic 1 step directions with model (such as stick out your tongue). He did perseveratively repeat actions and needed Mod A cues to model new 1 step directions. Pt able to help with oral care. Trial of ice chips given to facilitate use of swallow musculature. Pt with appearance of delayed swallow and immediate as well as delayed coughing (cough strength is significantly reduced). Trials limited d/t significant silent aspiration on previous 2 MBSS with thin liquids.   While pt does appear to be more alert and engaged, he continues to be at extremely high risk of aspiration and given the acute on chronic nature of his dysphagia and cognitive deficits, his swallow function is likely to take time to recover and may not recover to safe enough ability to consume PO intake for nutritional purposes. Continue to recommend NPO status. Upon initially entering room, pt did appear to have difficulty initially managing his saliva as evidenced by salvia in his mouth and some initial pharyngeal wetness when he first spoke. Unfortunately, given these issues with salvia management, pt may remain at risk of aspirating his secretions and possible aspiration pneumonia even with PEG placement. Recommend frequent, thorough oral care to reduce risk of aspiration pneumonia.  SLP will continue to follow for trial swallowing therapy.   This Probation officer conveyed the above information to pt's nurse as well as Dr Broadus John. Dr Broadus John to follow up with pt's family on their wishes regarding PEG placement.    HPI HPI:  44 year old male with a childhood brain tumor and developmental delay as well as seizures disorder, admitted from home.  He presents with several days of decreased oral intake dehydration and decreased mentation and mental status from baseline.  Was admitted early the morning of 12/19 found to be in septic shock, severe dehydration with severe hypernatremia, with suspected source to be UTI.  He was intubated for worsening respiratory acidosis and clinical coma. (ETT 12/20-27). CT brain 12/24 Slightly decreased size of the lateral ventricles with mild leftward.  Pt had MBS when [facility] to [facility] in Sept [facility], found to have dysphagia with silent aspiration and dysphagia 1/nectar liquids was recommended.       SLP Plan  Continue with current plan of care       Recommendations  Diet recommendations: NPO Medication Administration: Via alternative means                Oral Care Recommendations: Oral care QID Follow up Recommendations: (TBD) SLP Visit Diagnosis: Dysphagia, oropharyngeal phase (R13.12) Plan: Continue with current plan of care       GO                Zai Chmiel 11/01/2019, 1:48 PM

## 2019-11-01 NOTE — Progress Notes (Addendum)
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 -He was stabilized extubated and transferred to [facility] service from [facility] -Through his hospital course he was noted to have severe dysphagia, likely from progression of his chronic oropharyngeal dysphagia, requiring NG tube and tube feeds -Dysphagia, nutrition was discussed with family and decision made for PEG tube placement  Assessment & Plan:  Acute hypoxic respiratory failure -Secondary to septic shock/aspiration pneumonia and E. coli UTI -Resolved, extubated 12/27 -Stable on room air  Septic shock -Resolved, secondary to aspiration pneumonia +/-E. coli UTI -Stable off pressors, completed 7 days of antibiotics -Blood cultures negative  Encephalopathy -Due to septic shock, pneumonia/UTI -Improved back to baseline  Progressive dysphagia -Significantly worsened swallowing, not safe for oral intake, at risk of aspiration even with PEG tube -Currently on tube feeds via cortrak, Dr. Verlon Au discussed with patient's mother 2 days ago who agreed with PEG tube placement, I called and confirmed with patient's sister [name] today -IR consult for PEG tube placement  Developmental delay/mental retardation History of brain tumor status post resection/VP/shunt and history of seizures -Stable  Deep tissue pressure injuries -Wounds on his sacrum, right buttock, left medial heel and left ischial tuberosity -Continue wound care, air overlay mattress  Severe protein calorie malnutrition -Continue tube feeds, plan for PEG tube  Hypoglycemia -Patient is on a D10 drip and  concomitantly getting every 4 hours NovoLog and sliding scale -Stop D10 and insulin, monitor CBGs  Acute on chronic anemia -Anemia panel suggestive of chronic disease and mild iron deficiency -Trend and monitor  DVT prophylaxis: Add Lovenox Code Status: Full code Family Communication:No family at bedside, called and updated Sister [name] Disposition Plan: Home pending stability following PEG tube placement  Consultants:   PCCM  IR   Procedures:   Antimicrobials:    Subjective: -No events overnight, stable, tolerating tube feeds Objective: Vitals:   10/31/19 1714 10/31/19 2031 11/01/19 0427 11/01/19 0954  BP: 99/66 96/60 (!) 100/57 97/67  Pulse: 84 100 95 97  Resp: _0 Temp: 99.1 F (37.3 C) 97.7 F (36.5 C) 97.7 F (36.5 C) 98.5 F (36.9 C)  TempSrc: Oral   Oral  SpO2: 99% 100% 100% 94%  Weight:      Height:        Intake/Output Summary (Last 24 hours) at 11/01/2019 1359 Last data filed at 11/01/2019 1344 Gross per 24 hour  Intake 780 ml  Output 4551 ml  Net -3771 ml   Filed Weights   10/21/19 0445 10/23/19 0500 10/25/19 2007  Weight: 77.2 kg 69.7 kg 70.3 kg    Examination:  General exam: Pleasant male with developmental delay, awake alert, cognitive deficits noted, NG tube noted Respiratory system: Few basilar rhonchi Cardiovascular system: S1 & S2 heard, RRR Gastrointestinal system: Abdomen is nondistended, soft and nontender.Normal bowel sounds heard. Central nervous system: Alert and oriented x2, upper extremity contractures Extremities: Contracted upper extremities, no edema Psychiatry: Pleasant, poor insight and judgment   Data Reviewed:   CBC: Recent Labs  Lab 10/28/19 0604 10/29/19 0826 10/30/19 0545 10/31/19 0847  WBC 5.6 6.3 5.9 3.8*  NEUTROABS 3.2  --  3.4  1.9  HGB 7.0* 6.5* 7.6* 8.1*  HCT 21.6* 20.8* 24.7* 26.1*  MCV 96.4 101.0* 98.0 98.5  PLT 326 353 367 528   Basic Metabolic Panel: Recent Labs  Lab  10/28/19 0604 10/31/19 0847  NA 146* 143  K 3.2* 4.2  CL 106 106  CO2 30 27  GLUCOSE 97 103*  BUN 6 16  CREATININE 0.42* 0.41*  CALCIUM 7.6* 8.4*   GFR: Estimated Creatinine Clearance: 119.6 mL/min (A) (by C-G formula based on SCr of 0.41 mg/dL (L)). Liver Function Tests: No results for input(s): AST, ALT, ALKPHOS, BILITOT, PROT, ALBUMIN in the last 168 hours. No results for input(s): LIPASE, AMYLASE in the last 168 hours. No results for input(s): AMMONIA in the last 168 hours. Coagulation Profile: No results for input(s): INR, PROTIME in the last 168 hours. Cardiac Enzymes: No results for input(s): CKTOTAL, CKMB, CKMBINDEX, TROPONINI in the last 168 hours. BNP (last 3 results) No results for input(s): PROBNP in the last 8760 hours. HbA1C: No results for input(s): HGBA1C in the last 72 hours. CBG: Recent Labs  Lab 10/31/19 2029 11/01/19 0008 11/01/19 0426 11/01/19 0823 11/01/19 1141  GLUCAP 111* 109* 80 91 86   Lipid Profile: No results for input(s): CHOL, HDL, LDLCALC, TRIG, CHOLHDL, LDLDIRECT in the last 72 hours. Thyroid Function Tests: No results for input(s): TSH, T4TOTAL, FREET4, T3FREE, THYROIDAB in the last 72 hours. Anemia Panel: No results for input(s): VITAMINB12, FOLATE, FERRITIN, TIBC, IRON, RETICCTPCT in the last 72 hours. Urine analysis:    Component Value Date/Time   COLORURINE AMBER (A) 10/15/2019 0047   APPEARANCEUR CLOUDY (A) 10/15/2019 0047   LABSPEC 1.020 10/15/2019 0047   PHURINE 5.0 10/15/2019 0047   GLUCOSEU 50 (A) 10/15/2019 0047   HGBUR LARGE (A) 10/15/2019 0047   BILIRUBINUR NEGATIVE 10/15/2019 0047   KETONESUR 5 (A) 10/15/2019 0047   PROTEINUR 100 (A) 10/15/2019 0047   NITRITE NEGATIVE 10/15/2019 0047   LEUKOCYTESUR MODERATE (A) 10/15/2019 0047   Sepsis Labs: _0 (procalcitonin:4,lacticidven:4)  )No results found for this or any previous visit (from the past 240 hour(s)).       Radiology Studies: No results  found.      Scheduled Meds: . chlorhexidine gluconate (MEDLINE KIT)  15 mL Mouth Rinse BID  . Chlorhexidine Gluconate Cloth  6 each Topical Daily  . feeding supplement (PRO-STAT SUGAR FREE 64)  30 mL Per Tube BID  . free water  250 mL Per Tube Q4H  . insulin aspart  0-9 Units Subcutaneous Q4H  . insulin aspart  3 Units Subcutaneous Q4H  . loperamide  2 mg Oral BID  . pantoprazole sodium  40 mg Per Tube Daily  . valproic acid  500 mg Per Tube BID   Continuous Infusions: . sodium chloride 10 mL/hr at 10/23/19 1300  . sodium chloride    . dextrose 50 mL/hr at 11/01/19 1239  . feeding supplement (OSMOLITE 1.2 CAL) 1,000 mL (10/30/19 1849)     LOS: 18 days    Time spent:  56mn  PDomenic Polite MD [facility]  11/01/2019, 1:59 PM

## 2019-11-02 ENCOUNTER — Inpatient Hospital Stay (HOSPITAL_COMMUNITY): Payer: Medicare Other

## 2019-11-02 HISTORY — PX: IR GASTROSTOMY TUBE MOD SED: IMG625

## 2019-11-02 LAB — CBC
HCT: 24.3 % — ABNORMAL LOW (ref 39.0–52.0)
Hemoglobin: 7.6 g/dL — ABNORMAL LOW (ref 13.0–17.0)
MCH: 31.3 pg (ref 26.0–34.0)
MCHC: 31.3 g/dL (ref 30.0–36.0)
MCV: 100 fL (ref 80.0–100.0)
Platelets: 276 10*3/uL (ref 150–400)
RBC: 2.43 MIL/uL — ABNORMAL LOW (ref 4.22–5.81)
RDW: 15.8 % — ABNORMAL HIGH (ref 11.5–15.5)
WBC: 3.6 10*3/uL — ABNORMAL LOW (ref 4.0–10.5)
nRBC: 0 % (ref 0.0–0.2)

## 2019-11-02 LAB — GLUCOSE, CAPILLARY
Glucose-Capillary: 76 mg/dL (ref 70–99)
Glucose-Capillary: 74 mg/dL (ref 70–99)
Glucose-Capillary: 95 mg/dL (ref 70–99)
Glucose-Capillary: 66 mg/dL — ABNORMAL LOW (ref 70–99)
Glucose-Capillary: 119 mg/dL — ABNORMAL HIGH (ref 70–99)
Glucose-Capillary: 57 mg/dL — ABNORMAL LOW (ref 70–99)

## 2019-11-02 LAB — BASIC METABOLIC PANEL
Anion gap: 10 (ref 5–15)
BUN: 14 mg/dL (ref 6–20)
CO2: 27 mmol/L (ref 22–32)
Calcium: 8.5 mg/dL — ABNORMAL LOW (ref 8.9–10.3)
Chloride: 104 mmol/L (ref 98–111)
Creatinine, Ser: 0.51 mg/dL — ABNORMAL LOW (ref 0.61–1.24)
GFR calc Af Amer: >60 mL/min (ref >60)
GFR calc non Af Amer: >60 mL/min (ref >60)
Glucose, Bld: 90 mg/dL (ref 70–99)
Potassium: 4.7 mmol/L (ref 3.5–5.1)
Sodium: 141 mmol/L (ref 135–145)

## 2019-11-02 LAB — PROTIME-INR
INR: 1.1 (ref 0.8–1.2)
Prothrombin Time: 13.7 seconds (ref 11.4–15.2)

## 2019-11-02 MED ORDER — CEFAZOLIN SODIUM-DEXTROSE 2-4 GM/100ML-% IV SOLN
2.0000 g | Freq: Once | INTRAVENOUS | Status: AC
  Administered 2019-11-02: 2 g via INTRAVENOUS

## 2019-11-02 MED ORDER — DEXTROSE 50 % IV SOLN
INTRAVENOUS | Status: AC
  Administered 2019-11-02: 50 mL
  Filled 2019-11-02: qty 50

## 2019-11-02 MED ORDER — GLUCAGON HCL RDNA (DIAGNOSTIC) 1 MG IJ SOLR
INTRAMUSCULAR | Status: AC | PRN
  Administered 2019-11-02: .5 mg via INTRAVENOUS

## 2019-11-02 MED ORDER — VALPROIC ACID 250 MG/5ML PO SOLN
500.0000 mg | Freq: Two times a day (BID) | ORAL | Status: DC
  Administered 2019-11-03 – 2019-11-06 (×7): 500 mg
  Filled 2019-11-02 (×9): qty 10

## 2019-11-02 MED ORDER — FENTANYL CITRATE (PF) 100 MCG/2ML IJ SOLN
INTRAMUSCULAR | Status: AC | PRN
  Administered 2019-11-02 (×4): 25 ug via INTRAVENOUS

## 2019-11-02 MED ORDER — DEXTROSE 50 % IV SOLN
12.5000 g | INTRAVENOUS | Status: AC
  Administered 2019-11-02: 12.5 g via INTRAVENOUS
  Filled 2019-11-02: qty 50

## 2019-11-02 MED ORDER — IOHEXOL 300 MG/ML  SOLN
50.0000 mL | Freq: Once | INTRAMUSCULAR | Status: AC | PRN
  Administered 2019-11-02: 10 mL

## 2019-11-02 MED ORDER — MIDAZOLAM HCL 2 MG/2ML IJ SOLN
INTRAMUSCULAR | Status: AC | PRN
  Administered 2019-11-02: 0.5 mg via INTRAVENOUS
  Administered 2019-11-02 (×2): 1 mg via INTRAVENOUS

## 2019-11-02 MED ORDER — VALPROATE SODIUM 500 MG/5ML IV SOLN
500.0000 mg | Freq: Two times a day (BID) | INTRAVENOUS | Status: AC
  Administered 2019-11-02 (×2): 500 mg via INTRAVENOUS
  Filled 2019-11-02 (×2): qty 5

## 2019-11-02 MED ORDER — LIDOCAINE HCL 1 % IJ SOLN
INTRAMUSCULAR | Status: AC | PRN
  Administered 2019-11-02: 10 mL

## 2019-11-02 MED ORDER — LIDOCAINE HCL 1 % IJ SOLN
INTRAMUSCULAR | Status: AC
  Filled 2019-11-02: qty 20

## 2019-11-02 MED ORDER — CEFAZOLIN SODIUM-DEXTROSE 2-4 GM/100ML-% IV SOLN
INTRAVENOUS | Status: AC
  Filled 2019-11-02: qty 100

## 2019-11-02 MED ORDER — SODIUM CHLORIDE 0.9 % IV SOLN: INTRAVENOUS | Status: AC

## 2019-11-02 MED ORDER — SODIUM CHLORIDE 0.9 % IV SOLN
510.0000 mg | Freq: Once | INTRAVENOUS | Status: AC
  Administered 2019-11-02: 510 mg via INTRAVENOUS
  Filled 2019-11-02: qty 17

## 2019-11-02 MED ORDER — OSMOLITE 1.5 CAL PO LIQD
237.0000 mL | Freq: Every day | ORAL | Status: DC
  Administered 2019-11-03 – 2019-11-06 (×18): 237 mL
  Filled 2019-11-02 (×25): qty 237

## 2019-11-02 MED ORDER — MIDAZOLAM HCL 2 MG/2ML IJ SOLN
INTRAMUSCULAR | Status: AC
  Filled 2019-11-02: qty 4

## 2019-11-02 MED ORDER — CEFAZOLIN SODIUM-DEXTROSE 2-4 GM/100ML-% IV SOLN
2.0000 g | Freq: Once | INTRAVENOUS | Status: DC
  Filled 2019-11-02: qty 100

## 2019-11-02 MED ORDER — FENTANYL CITRATE (PF) 100 MCG/2ML IJ SOLN
INTRAMUSCULAR | Status: AC
  Filled 2019-11-02: qty 4

## 2019-11-02 MED ORDER — GLUCAGON HCL RDNA (DIAGNOSTIC) 1 MG IJ SOLR
INTRAMUSCULAR | Status: AC
  Filled 2019-11-02: qty 1

## 2019-11-02 MED ORDER — PANTOPRAZOLE SODIUM 40 MG IV SOLR
40.0000 mg | Freq: Once | INTRAVENOUS | Status: AC
  Administered 2019-11-02: 40 mg via INTRAVENOUS
  Filled 2019-11-02: qty 40

## 2019-11-02 NOTE — TOC Progression Note (Addendum)
Transition of Care [facility]) - Progression Note    Patient Details  Name: [name] MRN: [id number] Date of Birth: 1976/08/20  [facility]) CM/SW Contact  Bartholomew Crews, RN Phone Number: [phone] 11/02/2019, 12:47 PM  Clinical Narrative:    Alvis Lemmings accepted referral for [facility] RN with a start of care for Monday 11/06/2019. Patient will need [place] orders for RN with Face to Face prior to discharge. Spoke to [facility] concerning enteral needs - [facility] to follow up with family. TOC following for transition needs.    Expected Discharge Plan: [place] Barriers to Discharge: Continued Medical Work up  Expected Discharge Plan and Services Expected Discharge Plan: [place]   Discharge Planning Services: CM Consult Post Acute Care Choice: [place] arrangements for the past 2 months: Single Family Home                 DME Arranged: Tube feeding, Tube feeding pump DME Agency: Other - Comment([facility]) Date DME Agency Contacted: 11/02/19 Time DME Agency Contacted: 1243 Representative spoke with at DME Agency: [facility] Arranged: RN [place] Agency: [place] Date [place]: 11/02/19 Time [place]: 1247 Representative spoke with at [place]: [place] ([place]) Interventions    Readmission Risk Interventions No flowsheet data found.

## 2019-11-02 NOTE — Progress Notes (Signed)
PROGRESS NOTE    [name]  ZTI:[id number] DOB: [birth year] DOA: 10/14/2019 PCP: [facility], [facility]  Brief Narrative: 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 -He was stabilized extubated and transferred to [facility] service from PCCM -Through his hospital course he was noted to have severe dysphagia, likely from progression of his chronic oropharyngeal dysphagia, requiring NG tube and tube feeds -Dysphagia, nutrition was discussed with family and decision made for PEG tube placement -also noted to have worsening of chronic anemia -PEG placed 1/7  Assessment & Plan:  Acute hypoxic respiratory failure -Secondary to septic shock/aspiration pneumonia and E. coli UTI -Resolved, extubated 12/27 -Stable on room air  Septic shock -Resolved, secondary to aspiration pneumonia +/-E. coli UTI -Stable off pressors, completed 7 days of antibiotics -Blood cultures negative  Encephalopathy -Due to septic shock, pneumonia/UTI -Improved back to baseline  Progressive dysphagia -Significantly worsened swallowing, SLP eval completed, noted to have progression of chronic dysphagia, not safe for oral intake, at risk of aspiration even with PEG tube -Was getting tube feeds via cortrak,  Dr. Verlon Au discussed with patient's mother 2 days ago who agreed with PEG tube placement, I called and confirmed with patient's sister Lewie Deman who agreed -IR consulted for PEG tube placement, this is being done today  Acute on chronic anemia -Hemoglobin fluctuating between 6.5 and 8.5 this admission  -anemia panel suggestive of chronic disease and mild iron deficiency -Hemoglobin down to 7.6 today, continue to monitor -Give IV iron x1 today -Was  transfused 2 units of PRBC earlier this admission   Developmental delay/mental retardation History of brain tumor status post resection/VP/shunt and history of seizures -Stable  Deep tissue pressure injuries -Wounds on his sacrum, right buttock, left medial heel and left ischial tuberosity -Continue wound care, air overlay mattress  Severe protein calorie malnutrition -Continue tube feeds, plan for PEG tube  Hypoglycemia -Resolved off insulin and D10 drip -Restart tube feeds tomorrow  DVT prophylaxis: Lovenox Code Status: Full code Family Communication:No family at bedside, called and updated Sister Saige Busby Disposition Plan: Home pending stability following PEG tube placement, likely in 1 to 2 days  Consultants:   PCCM  IR   Procedures: PEG tube in IR 1/7  Antimicrobials:    Subjective:  -Feels okay, no events overnight, tube feeds on hold  Objective: Vitals:   11/02/19 1005 11/02/19 1012 11/02/19 1036 11/02/19 1118  BP: 126/72 108/65 (!) 94/59 112/87  Pulse: 91 89 95 75  Resp: 13 (!) _0 Temp:   97.6 F (36.4 C) 97.7 F (36.5 C)  TempSrc:   Oral Oral  SpO2: 96% 97% 96% 95%  Weight:      Height:        Intake/Output Summary (Last 24 hours) at 11/02/2019 1329 Last data filed at 11/02/2019 0601 Gross per 24 hour  Intake 2300 ml  Output 3350 ml  Net -1050 ml   Filed Weights   10/25/19 2007 11/01/19 2033 11/02/19 0500  Weight: 70.3 kg 72.3 kg 72.3 kg    Examination:  Gen: Thinly built pleasant male with developmental delay, awake alert, cognitive deficits noted HEENT: PERRLA, Neck supple, no JVD Lungs: Few basilar rhonchi, otherwise clear CVS: RRR,No Gallops,Rubs or new Murmurs Abd: soft, Non tender, non distended, BS  present Extremities: No edema, upper extremity contractures Skin: no new rashes Psychiatry: Pleasant, poor insight and judgment   Data Reviewed:   CBC: Recent Labs  Lab 10/28/19 0604 10/29/19 0826 10/30/19 0545  10/31/19 0847 11/02/19 0354  WBC 5.6 6.3 5.9 3.8* 3.6*  NEUTROABS 3.2  --  3.4 1.9  --   HGB 7.0* 6.5* 7.6* 8.1* 7.6*  HCT 21.6* 20.8* 24.7* 26.1* 24.3*  MCV 96.4 101.0* 98.0 98.5 100.0  PLT 326 353 367 338 532   Basic Metabolic Panel: Recent Labs  Lab 10/28/19 0604 10/31/19 0847 11/02/19 0354  NA 146* 143 141  K 3.2* 4.2 4.7  CL 106 106 104  CO2 _0 GLUCOSE 97 103* 90  BUN _1 CREATININE 0.42* 0.41* 0.51*  CALCIUM 7.6* 8.4* 8.5*   GFR: Estimated Creatinine Clearance: 123 mL/min (A) (by C-G formula based on SCr of 0.51 mg/dL (L)). Liver Function Tests: No results for input(s): AST, ALT, ALKPHOS, BILITOT, PROT, ALBUMIN in the last 168 hours. No results for input(s): LIPASE, AMYLASE in the last 168 hours. No results for input(s): AMMONIA in the last 168 hours. Coagulation Profile: Recent Labs  Lab 11/02/19 0354  INR 1.1   Cardiac Enzymes: No results for input(s): CKTOTAL, CKMB, CKMBINDEX, TROPONINI in the last 168 hours. BNP (last 3 results) No results for input(s): PROBNP in the last 8760 hours. HbA1C: No results for input(s): HGBA1C in the last 72 hours. CBG: Recent Labs  Lab 11/01/19 2032 11/01/19 2354 11/02/19 0443 11/02/19 0739 11/02/19 1152  GLUCAP 86 88 76 74 95   Lipid Profile: No results for input(s): CHOL, HDL, LDLCALC, TRIG, CHOLHDL, LDLDIRECT in the last 72 hours. Thyroid Function Tests: No results for input(s): TSH, T4TOTAL, FREET4, T3FREE, THYROIDAB in the last 72 hours. Anemia Panel: No results for input(s): VITAMINB12, FOLATE, FERRITIN, TIBC, IRON, RETICCTPCT in the last 72 hours. Urine analysis:    Component Value Date/Time   COLORURINE AMBER (A) 10/15/2019 0047   APPEARANCEUR CLOUDY (A) 10/15/2019 0047   LABSPEC 1.020 10/15/2019 0047   PHURINE 5.0 10/15/2019 0047   GLUCOSEU 50 (A) 10/15/2019 0047   HGBUR LARGE (A) 10/15/2019 0047   BILIRUBINUR NEGATIVE 10/15/2019 0047   KETONESUR 5 (A) 10/15/2019 0047   PROTEINUR 100 (A)  10/15/2019 0047   NITRITE NEGATIVE 10/15/2019 0047   LEUKOCYTESUR MODERATE (A) 10/15/2019 0047   Sepsis Labs: _2 (procalcitonin:4,lacticidven:4)  )No results found for this or any previous visit (from the past 240 hour(s)).       Radiology Studies: IR GASTROSTOMY TUBE MOD SED  Result Date: 11/02/2019 INDICATION: 44 year old with dysphagia.  Request for gastrostomy tube placement. EXAM: PERCUTANEOUS GASTROSTOMY TUBE WITH FLUOROSCOPIC GUIDANCE Physician: Stephan Minister. Anselm Pancoast, MD MEDICATIONS: Ancef 2 g; Antibiotics were administered within 1 hour of the procedure. Glucagon 0.5 mg IV ANESTHESIA/SEDATION: Versed 2.5 mg IV; Fentanyl 100 mcg IV Moderate Sedation Time:  17 minutes The patient was continuously monitored during the procedure by the interventional radiology nurse under my direct supervision. FLUOROSCOPY TIME:  Fluoroscopy Time: 3 minutes, 6 seconds, 6 mGy COMPLICATIONS: None immediate. PROCEDURE: Informed consent was obtained for a percutaneous gastrostomy tube. The patient was placed on the interventional table. Fluoroscopy demonstrated oral contrast in the transverse colon. An orogastric tube was placed with fluoroscopic guidance. The anterior abdomen was prepped and draped in sterile fashion. Maximal barrier sterile technique was utilized including caps, mask, sterile gowns, sterile gloves, sterile drape, hand hygiene and skin antiseptic. Stomach was inflated with air through  the orogastric tube. The skin and subcutaneous tissues were anesthetized with 1% lidocaine. A 17 gauge needle was directed into the distended stomach with fluoroscopic guidance. A wire was advanced into the stomach and a T-tact was deployed. A 9-French vascular sheath was placed and the orogastric tube was snared using a Gooseneck snare device. The orogastric tube and snare were pulled out of the patient's mouth. The snare device was connected to a 20-French gastrostomy tube. The snare device and gastrostomy tube were  pulled through the patient's mouth and out the anterior abdominal wall. The gastrostomy tube was cut to an appropriate length. Contrast injection through gastrostomy tube confirmed placement within the stomach. Fluoroscopic images were obtained for documentation. The gastrostomy tube was flushed with normal saline. IMPRESSION: Successful fluoroscopic guided percutaneous gastrostomy tube placement. Electronically Signed   By: Markus Daft M.D.   On: 11/02/2019 11:07        Scheduled Meds:  chlorhexidine gluconate (MEDLINE KIT)  15 mL Mouth Rinse BID   Chlorhexidine Gluconate Cloth  6 each Topical Daily   [START ON 11/03/2019] enoxaparin (LOVENOX) injection  40 mg Subcutaneous Q24H   [START ON 11/03/2019] feeding supplement (OSMOLITE 1.5 CAL)  237 mL Per Tube 6 X Daily   free water  250 mL Per Tube Q4H   loperamide  2 mg Oral BID   pantoprazole sodium  40 mg Per Tube Daily   [START ON 11/03/2019] valproic acid  500 mg Per Tube BID   Continuous Infusions:  sodium chloride 10 mL/hr at 10/23/19 1300   sodium chloride 75 mL/hr at 11/02/19 1309   valproate sodium 500 mg (11/02/19 1310)     LOS: 19 days    Time spent:  30mn  PDomenic Polite MD [facility]  11/02/2019, 1:29 PM

## 2019-11-02 NOTE — Progress Notes (Signed)
Occupational Therapy Treatment Patient Details Name: [name] OR MRN: [id number] DOB: 1976-01-26 Today's Date: 11/02/2019    History of present illness Pt is a 44 y/o male with PMH of childhoold brain tumor, developmental delay, seizure disorder. Admitted 12/19 found in septic shock, severe dehydration with severe hypernatremia, with suspected UTI. Intubated 12/20-12/27.   OT comments  Patient supine in bed and agreeable to OT session. Focus on UE strengthening for engagement in ADLs.  Patient completed B UE exercises as below, holding to lotion container for added weight to L UE and AAROM to R UE.  Guiding support during sessions due to weakness and decreased coordination.  Pt highly motivated and engaging appropriately today.  Will follow acutely.    Follow Up Recommendations  Home health OT;Supervision/Assistance - 24 hour    Equipment Recommendations  Other (comment)    Recommendations for Other Services      Precautions / Restrictions Precautions Precautions: Fall Precaution Comments: use care on L side of head, surgery from childhood       Mobility Bed Mobility               General bed mobility comments: remained supine in bed during session   Transfers                      Balance                                           ADL either performed or assessed with clinical judgement   ADL                                         General ADL Comments: declined engagement in ADLs, as planned surgery today and wanted to wait until after      Vision       Perception     Praxis      Cognition Arousal/Alertness: Awake/alert Behavior During Therapy: WFL for tasks assessed/performed Overall Cognitive Status: History of cognitive impairments - at baseline                                 General Comments: patient verbalizing today and engaging appropratiely, able to follow commands with increased  time to process; appropraite questions regarding upcoming surgery (for PEG)        Exercises Exercises: General Upper Extremity General Exercises - Upper Extremity Shoulder Flexion: AAROM;Left;10 reps Shoulder Extension: AAROM;Left;10 reps Elbow Flexion: AAROM;10 reps;Both;Supine Elbow Extension: AAROM;10 reps;Both;Supine Digit Composite Flexion: AROM;Both;10 reps;Supine Composite Extension: AROM;Both;10 reps;Supine   Shoulder Instructions       General Comments      Pertinent Vitals/ Pain       Pain Assessment: Faces Faces Pain Scale: No hurt  Home Living                                          Prior Functioning/Environment              Frequency  Min 2X/week        Progress Toward Goals  OT Goals(current goals can now be found in  the care plan section)  Progress towards OT goals: Progressing toward goals  Acute Rehab OT Goals Patient Stated Goal: to be able to eat  OT Goal Formulation: With patient Time For Goal Achievement: 11/06/19 Potential to Achieve Goals: [place] Discharge plan remains appropriate;Frequency remains appropriate    Co-evaluation                 AM-PAC OT "6 Clicks" Daily Activity     Outcome Measure   Help from another person eating meals?: Total Help from another person taking care of personal grooming?: A Lot Help from another person toileting, which includes using toliet, bedpan, or urinal?: Total Help from another person bathing (including washing, rinsing, drying)?: A Lot Help from another person to put on and taking off regular upper body clothing?: Total Help from another person to put on and taking off regular lower body clothing?: Total 6 Click Score: 8    End of Session    OT Visit Diagnosis: Other abnormalities of gait and mobility (R26.89);Muscle weakness (generalized) (M62.81);Pain   Activity Tolerance Patient tolerated treatment well   Patient Left in bed;with bed alarm set    Nurse Communication Mobility status;Other (comment)(need for soft call bell )        Time: [id number] OT Time Calculation (min): 22 min  Charges: OT General Charges $OT Visit: 1 Visit OT Treatments $Therapeutic Exercise: 8-22 mins  Jolaine Artist, Meta Pager [phone] Office [phone]    Delight Stare 11/02/2019, 8:54 AM

## 2019-11-02 NOTE — Progress Notes (Signed)
Nutrition Follow-up  DOCUMENTATION CODES:   Severe malnutrition in context of social or environmental circumstances, Underweight  INTERVENTION:   Tomorrow start bolus feedings:   -1 can of Osmolite 1.5 six times daily via PEG -Free water flushes 100 ml before and after each bolus   Provides: 2130 kcals, 90 grams protein, 1086 ml free water (2286 ml with free water flushes).   NUTRITION DIAGNOSIS:   Severe Malnutrition related to social / environmental circumstances(developmental delay) as evidenced by severe fat depletion, severe muscle depletion, percent weight loss(26.5% weight loss within the past year).  Ongoing  GOAL:   Patient will meet greater than or equal to 90% of their needs  Addressed via TF  MONITOR:   Vent status, TF tolerance, Labs, Skin, Weight trends  REASON FOR ASSESSMENT:   Consult Enteral/tube feeding initiation and management  ASSESSMENT:  44 yo male admitted with septic shock, severe dehydration, severe hypernatremia, suspected source UTI. Recent several days of decreased oral intake, decreased mental status. 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  12/24-CT brain- no intercranial hemorrhage 12/27-extubated  12/28- gastric Cortrak placed   Pt had PEG placed this am. Cortrak pulled out. Plan to start feeding tomorrow. Spoke with sister via phone. Discussed how bolus feedings would be administered and how often. Sister reports they could make this work at home and would like to try this vs the pump. Discussed plan with case manager and home health.   Admission weight: 52.6 kg  Current weight: 72.3 kg   I/O: -16,104 ml since 12/24 UOP: 4,700 ml x 24 hrs   Medications: imodium Labs: CBG 74-95  Diet Order:   Diet Order            Diet NPO time specified Except for: Sips with Meds  Diet effective midnight              EDUCATION NEEDS:   No education needs have been identified at  this time  Skin:  Skin Assessment: Skin Integrity Issues: Skin Integrity Issues:: DTI, Stage II DTI: sacrum Stage II: L arm, R ischial tuberosity, sacrum Stage III: n/a  Last BM:  1/5   Height:   Ht Readings from Last 1 Encounters:  10/14/19 5\' 11"  (1.803 m)    Weight:   Wt Readings from Last 1 Encounters:  11/02/19 72.3 kg    Ideal Body Weight:  78.2 kg  BMI:  Body mass index is 22.23 kg/m.  Estimated Nutritional Needs:   Kcal:  1900-2100 kcal  Protein:  90-105 grams  Fluid:  >/= 1.9 L/day   [name] RD, LDN Clinical Nutrition Pager # - [phone]

## 2019-11-02 NOTE — TOC Initial Note (Signed)
Transition of Care [facility]) - Initial/Assessment Note    Patient Details  Name: [name] MRN: [id number] Date of Birth: 12-17-1975  Transition of [facility]) CM/SW Contact:    Bartholomew Crews, RN Phone Number: [phone] 11/02/2019, 10:55 AM  Clinical Narrative:                 Spoke with patient's sister, Janett Billow, on the phone. PTA patient home with family and gets home care services through [facility].   Discussed choice for [facility] agency - referral placed to [facility] is pending for [facility] RN. Discussed that patient will be discharged home with peg tube and enteral feedings. [place] RN to provide teaching and support in the home. Bedside nursing to provide initial teaching.   [facility] notified of pending need for TF and pump at discharge - awaiting orders  Discussed situation with dietitian who will f/u with recommendations. Marland Kitchen   PCP is [facility] on [address] [place] [facility] [address]   Patient will need [facility] transport at discharge.   TOC team following for transition needs.   Expected Discharge Plan: [place] Barriers to Discharge: Continued Medical Work up   Patient Goals and CMS Choice Patient states their goals for this hospitalization and ongoing recovery are:: return home with family CMS Medicare.gov Compare Post Acute Care list provided to:: Patient Represenative (must comment) Choice offered to / list presented to : Sibling, Adult Children  Expected Discharge Plan and Services Expected Discharge Plan: [place]   Discharge Planning Services: CM Consult Post Acute Care Choice: [place] arrangements for the past 2 months: Single Family Home                 DME Arranged: Tube feeding, Tube feeding pump DME Agency: [facility] Date DME Agency Contacted: 11/02/19 Time DME Agency Contacted: 1054 Representative spoke with at DME Agency: [place]: RN [place] Agency: [place] Date [place]: 11/02/19 Time [place]: 1054 Representative spoke with at [place] - referral pending  Prior Living Arrangements/Services Living arrangements for the past 2 months: Single Family Home Lives with:: Self, Siblings, Parents          Need for Family Participation in Patient Care: Yes (Comment) Care giver support system in place?: Yes (comment) Current home services: DME Criminal Activity/Legal Involvement Pertinent to Current Situation/Hospitalization: No - Comment as needed  Activities of Daily Living Home Assistive Devices/Equipment: Wheelchair ADL Screening (condition at time of admission) Patient's cognitive ability adequate to safely complete daily activities?: No Is the patient deaf or have difficulty hearing?: No Does the patient have difficulty seeing, even when wearing glasses/contacts?: No Does the patient have difficulty concentrating, remembering, or making decisions?: Yes Patient able to express need for assistance with ADLs?: No Does the patient have difficulty dressing or bathing?: Yes Independently performs ADLs?: No Does the patient have difficulty walking or climbing stairs?: Yes Weakness of Legs: Both Weakness of Arms/Hands: Both  Permission Sought/Granted                  Emotional Assessment         Alcohol / Substance Use: Not Applicable Psych Involvement: No (comment)  Admission diagnosis:  Pneumonia [J18.9] Patient Active Problem List   Diagnosis Date Noted  . Protein-calorie malnutrition, severe 10/16/2019  . Acute respiratory failure ([place])   . Shock ([place])   .  Encounter for central line placement   . Acute respiratory failure with hypoxia and hypercapnia ([facility])   . Pneumonia 10/14/2019  . Pressure injury of skin 07/25/2019  . Hypernatremia 07/24/2019  . Acute hypernatremia 07/24/2019  . Right leg weakness 04/03/2016  . Abnormality of gait 03/12/2016  . MENTAL RETARDATION, MILD 09/05/2007   . 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 HYPERTENSION 09/05/2007  . 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 HEMORRHAGE, INTRACEREBRAL 06/28/2007  . 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 H/O craniotomy 06/28/2007  . INFECTION DUE TO NERVOUS SYSTEM DEVICE/GRAFT 06/22/2007   PCP:  Patient, No Pcp Per Pharmacy:   [id number] [place] [place] - [address] [place] [address] Phone: [phone] Fax: [phone]     Social Determinants of Health (SDOH) Interventions    Readmission Risk Interventions No flowsheet data found.

## 2019-11-02 NOTE — Procedures (Signed)
Interventional Radiology Procedure:   Indications: Dysphagia  Procedure: Gastrostomy tube placement  Findings: 20 Fr gastrostomy tube in stomach  Complications: None     EBL: less than 10 ml  Plan: Anticipate starting feeds tomorrow.    Sharika Mosquera R. Anselm Pancoast, MD  Pager: [phone]

## 2019-11-03 LAB — BASIC METABOLIC PANEL
Anion gap: 7 (ref 5–15)
BUN: 11 mg/dL (ref 6–20)
CO2: 27 mmol/L (ref 22–32)
Calcium: 8.7 mg/dL — ABNORMAL LOW (ref 8.9–10.3)
Chloride: 107 mmol/L (ref 98–111)
Creatinine, Ser: 0.5 mg/dL — ABNORMAL LOW (ref 0.61–1.24)
GFR calc Af Amer: >60 mL/min (ref >60)
GFR calc non Af Amer: >60 mL/min (ref >60)
Glucose, Bld: 80 mg/dL (ref 70–99)
Potassium: 4.2 mmol/L (ref 3.5–5.1)
Sodium: 141 mmol/L (ref 135–145)

## 2019-11-03 LAB — GLUCOSE, CAPILLARY
Glucose-Capillary: 74 mg/dL (ref 70–99)
Glucose-Capillary: 80 mg/dL (ref 70–99)
Glucose-Capillary: 80 mg/dL (ref 70–99)
Glucose-Capillary: 89 mg/dL (ref 70–99)
Glucose-Capillary: 98 mg/dL (ref 70–99)
Glucose-Capillary: 98 mg/dL (ref 70–99)

## 2019-11-03 LAB — CBC
HCT: 24.6 % — ABNORMAL LOW (ref 39.0–52.0)
Hemoglobin: 7.7 g/dL — ABNORMAL LOW (ref 13.0–17.0)
MCH: 30.7 pg (ref 26.0–34.0)
MCHC: 31.3 g/dL (ref 30.0–36.0)
MCV: 98 fL (ref 80.0–100.0)
Platelets: 284 10*3/uL (ref 150–400)
RBC: 2.51 MIL/uL — ABNORMAL LOW (ref 4.22–5.81)
RDW: 15.2 % (ref 11.5–15.5)
WBC: 4.1 10*3/uL (ref 4.0–10.5)
nRBC: 0 % (ref 0.0–0.2)

## 2019-11-03 MED ORDER — LOPERAMIDE HCL 1 MG/7.5ML PO SUSP
2.0000 mg | Freq: Two times a day (BID) | ORAL | Status: DC
  Administered 2019-11-04 (×2): 2 mg
  Filled 2019-11-03 (×2): qty 15

## 2019-11-03 NOTE — Progress Notes (Signed)
Physical Therapy Treatment Patient Details Name: [name] MRN: [id number] DOB: [birth year] Today's Date: 11/03/2019    History of Present Illness Pt is a 44 y/o male with PMH of childhoold brain tumor, developmental delay, seizure disorder. Admitted 12/19 found in septic shock, severe dehydration with severe hypernatremia, with suspected UTI. Intubated 12/20-12/27.    PT Comments    Pt was seen today and noted his extensive surprising verbalizations about his home life, family and asking to watch [employer] on TV.  He is motivated and willing to work, assisted to move his legs with PT cues.  Follow acutely for these needs, and will work on a transfer to chair with nursing as pt will permit using maxi move.  Pt is expecting to return to his previous assisted living situation.   Follow Up Recommendations  No PT follow up     Equipment Recommendations  None recommended by PT    Recommendations for Other Services       Precautions / Restrictions Precautions Precautions: Fall Precaution Comments: use care on L side of head, surgery from childhood Restrictions Weight Bearing Restrictions: No Other Position/Activity Restrictions: pt tends to lean head to L     Mobility  Bed Mobility Overal bed mobility: Needs Assistance Bed Mobility: (scooting up in bed)           General bed mobility comments: total assist to scoot up in bed   Transfers Overall transfer level: Needs assistance               General transfer comment: declines OOB  Ambulation/Gait                 Stairs             Wheelchair Mobility    Modified Rankin (Stroke Patients Only)       Balance                                            Cognition Arousal/Alertness: Awake/alert Behavior During Therapy: WFL for tasks assessed/performed Overall Cognitive Status: History of cognitive impairments - at baseline                                        Exercises General Exercises - Lower Extremity Ankle Circles/Pumps: PROM;5 reps Heel Slides: AAROM;10 reps Hip ABduction/ADduction: AAROM;10 reps Straight Leg Raises: PROM;10 reps;AAROM Hip Flexion/Marching: AAROM;10 reps    General Comments        Pertinent Vitals/Pain Pain Assessment: Faces Faces Pain Scale: No hurt    Home Living                      Prior Function            PT Goals (current goals can now be found in the care plan section) Acute Rehab PT Goals Patient Stated Goal: to get home Progress towards PT goals: Progressing toward goals    Frequency    Min 2X/week      PT Plan Current plan remains appropriate    Co-evaluation              AM-PAC PT "6 Clicks" Mobility   Outcome Measure  Help needed turning from your back to your side while in a flat bed without  using bedrails?: Total Help needed moving from lying on your back to sitting on the side of a flat bed without using bedrails?: Total Help needed moving to and from a bed to a chair (including a wheelchair)?: Total Help needed standing up from a chair using your arms (e.g., wheelchair or bedside chair)?: Total Help needed to walk in hospital room?: Total Help needed climbing 3-5 steps with a railing? : Total 6 Click Score: 6    End of Session   Activity Tolerance: Patient tolerated treatment well Patient left: in bed;with call bell/phone within reach;with bed alarm set Nurse Communication: Mobility status PT Visit Diagnosis: Other abnormalities of gait and mobility (R26.89)     Time: [id number] PT Time Calculation (min) (ACUTE ONLY): 25 min  Charges:  $Therapeutic Exercise: 8-22 mins $Therapeutic Activity: 8-22 mins                    [name] 11/03/2019, 1:34 PM   Mee Hives, PT MS Acute Rehab Dept. Number: [place] and [place]

## 2019-11-03 NOTE — TOC Progression Note (Signed)
Transition of Care [facility]) - Progression Note    Patient Details  Name: HUMBERTO WEDLAKE MRN: [id number] Date of Birth: 1975/10/29  Transition of Care [facility]) CM/SW Contact  Bartholomew Crews, RN Phone Number: [phone] 11/03/2019, 12:21 PM  Clinical Narrative:    Notified from [facility] liaison that patient's hospital bed is broken. [facility] notified of need for the bed to be switched out, and will follow up, but may not happen until tomorrow.   Expected Discharge Plan: [place] Barriers to Discharge: Continued Medical Work up  Expected Discharge Plan and Services Expected Discharge Plan: [place]   Discharge Planning Services: CM Consult Post Acute Care Choice: [place] arrangements for the past 2 months: Single Family Home                 DME Arranged: Tube feeding, Tube feeding pump DME Agency: Other - Comment([facility]) Date DME Agency Contacted: 11/02/19 Time DME Agency Contacted: 1243 Representative spoke with at DME Agency: [facility] Arranged: RN [place] Agency: [place] Date [place]: 11/02/19 Time [place]: 1247 Representative spoke with at [place]: [place] ([place]) Interventions    Readmission Risk Interventions No flowsheet data found.

## 2019-11-03 NOTE — Progress Notes (Signed)
Referring Physician(s): Domenic Polite  Supervising Physician: Markus Daft  Patient Status:  [facility] - In-pt  Chief Complaint: None  Subjective:  Dysphagia s/p percutaneous gastrostomy tube placement 11/02/2019 by Dr. Anselm Pancoast. Patient awake and alert laying in bed watching TV with no complaints at this time. Gastrostomy tube site c/d/i.   Allergies: Patient has no known allergies.  Medications: Prior to Admission medications   Medication Sig Start Date End Date Taking? Authorizing Provider  divalproex (DEPAKOTE) 500 MG DR tablet Take 1,000 mg by mouth 2 (two) times daily. 09/20/19  Yes [provider]  feeding supplement, ENSURE ENLIVE, (ENSURE ENLIVE) LIQD Take 237 mLs by mouth 3 (three) times daily between meals. 08/02/19  Yes Rory Percy, DO  olmesartan-hydrochlorothiazide (BENICAR HCT) 20-12.5 MG tablet Take 1 tablet by mouth daily. 09/20/19  Yes [provider]  divalproex (DEPAKOTE SPRINKLE) 125 MG capsule Take 4 capsules (500 mg total) by mouth 4 (four) times daily. Patient not taking: Reported on 10/15/2019 08/02/19   Rory Percy, DO     Vital Signs: BP 102/66 (BP Location: Left Arm)   Pulse 64   Temp 98.2 F (36.8 C) (Oral)   Resp 18   Ht 5\' 11"  (1.803 m)   Wt 159 lb 6.3 oz (72.3 kg)   SpO2 100%   BMI 22.23 kg/m   Physical Exam Vitals and nursing note reviewed.  Constitutional:      General: He is not in acute distress. Pulmonary:     Effort: Pulmonary effort is normal. No respiratory distress.  Abdominal:     Comments: Gastrostomy tube site without tenderness, erythema, drainage, or active bleeding; bumper cinched to skin.  Skin:    General: Skin is warm and dry.  Neurological:     Mental Status: He is alert.     Imaging: IR GASTROSTOMY TUBE MOD SED  Result Date: 11/02/2019 INDICATION: 44 year old with dysphagia.  Request for gastrostomy tube placement. EXAM: PERCUTANEOUS GASTROSTOMY TUBE WITH FLUOROSCOPIC GUIDANCE Physician:  Stephan Minister. Anselm Pancoast, MD MEDICATIONS: Ancef 2 g; Antibiotics were administered within 1 hour of the procedure. Glucagon 0.5 mg IV ANESTHESIA/SEDATION: Versed 2.5 mg IV; Fentanyl 100 mcg IV Moderate Sedation Time:  17 minutes The patient was continuously monitored during the procedure by the interventional radiology nurse under my direct supervision. FLUOROSCOPY TIME:  Fluoroscopy Time: 3 minutes, 6 seconds, 6 mGy COMPLICATIONS: None immediate. PROCEDURE: Informed consent was obtained for a percutaneous gastrostomy tube. The patient was placed on the interventional table. Fluoroscopy demonstrated oral contrast in the transverse colon. An orogastric tube was placed with fluoroscopic guidance. The anterior abdomen was prepped and draped in sterile fashion. Maximal barrier sterile technique was utilized including caps, mask, sterile gowns, sterile gloves, sterile drape, hand hygiene and skin antiseptic. Stomach was inflated with air through the orogastric tube. The skin and subcutaneous tissues were anesthetized with 1% lidocaine. A 17 gauge needle was directed into the distended stomach with fluoroscopic guidance. A wire was advanced into the stomach and a T-tact was deployed. A 9-French vascular sheath was placed and the orogastric tube was snared using a Gooseneck snare device. The orogastric tube and snare were pulled out of the patient's mouth. The snare device was connected to a 20-French gastrostomy tube. The snare device and gastrostomy tube were pulled through the patient's mouth and out the anterior abdominal wall. The gastrostomy tube was cut to an appropriate length. Contrast injection through gastrostomy tube confirmed placement within the stomach. Fluoroscopic images were obtained for documentation. The gastrostomy  tube was flushed with normal saline. IMPRESSION: Successful fluoroscopic guided percutaneous gastrostomy tube placement. Electronically Signed   By: Markus Daft M.D.   On: 11/02/2019 11:07     Labs:  CBC: Recent Labs    10/30/19 0545 10/31/19 0847 11/02/19 0354 11/03/19 0606  WBC 5.9 3.8* 3.6* 4.1  HGB 7.6* 8.1* 7.6* 7.7*  HCT 24.7* 26.1* 24.3* 24.6*  PLT 367 338 276 284    COAGS: Recent Labs    07/24/19 1014 10/14/19 2030 10/19/19 0837 11/02/19 0354  INR 1.2 1.2 1.1 1.1  APTT 32 38*  --   --     BMP: Recent Labs    10/28/19 0604 10/31/19 0847 11/02/19 0354 11/03/19 0606  NA 146* 143 141 141  K 3.2* 4.2 4.7 4.2  CL 106 106 104 107  CO2 30 27 27 27   GLUCOSE 97 103* 90 80  BUN 6 16 14 11   CALCIUM 7.6* 8.4* 8.5* 8.7*  CREATININE 0.42* 0.41* 0.51* 0.50*  GFRNONAA >60 >60 >60 >60  GFRAA >60 >60 >60 >60    LIVER FUNCTION TESTS: Recent Labs    07/24/19 1014 07/25/19 1207 07/26/19 0715 10/14/19 2000 10/20/19 0357 10/21/19 0336 10/22/19 0418  BILITOT 0.6 0.5 1.0 1.3*  --   --   --   AST 73* 78* 62* 54*  --   --   --   ALT 23 23 21 18   --   --   --   ALKPHOS 47 36* 32* 43  --   --   --   PROT 7.7 5.5* 5.0* 6.9  --   --   --   ALBUMIN 3.3* 2.3* 2.1* 2.7* 1.3* 1.4* 1.5*    Assessment and Plan:  Dysphagia s/p percutaneous gastrostomy tube placement 11/02/2019 by Dr. Anselm Pancoast. Gastrostomy tube stable- tube ready fur use. Further plans per [facility]- appreciate and agree with management. Please call IR with questions/concerns.   Electronically Signed: Earley Abide, PA-C 11/03/2019, 11:08 AM   I spent a total of 15 Minutes at the the patient's bedside AND on the patient's hospital floor or unit, greater than 50% of which was counseling/coordinating care for dysphagia s/p gastrostomy tube placement.

## 2019-11-03 NOTE — Plan of Care (Signed)
  Problem: Nutrition: Goal: Adequate nutrition will be maintained Outcome: Progressing   Problem: Skin Integrity: Goal: Risk for impaired skin integrity will decrease Outcome: Progressing   

## 2019-11-03 NOTE — TOC Progression Note (Signed)
Transition of Care [facility]) - Progression Note    Patient Details  Name: [name] MRN: [id number] Date of Birth: January 02, 1976  Transition of Care [facility]) CM/SW Contact  Bartholomew Crews, RN Phone Number: [phone] 11/03/2019, 5:14 PM  Clinical Narrative:    Received call from [facility]. Patient cannot dc until [facility] exchanges hospital bed which may happen tomorrow. Discussed dcp for Sunday with MD. Alvis Lemmings will plan for dc Sunday morning with start of care same day. TOC team following.    Expected Discharge Plan: [place] Barriers to Discharge: Continued Medical Work up  Expected Discharge Plan and Services Expected Discharge Plan: [place]   Discharge Planning Services: CM Consult Post Acute Care Choice: [place] arrangements for the past 2 months: Single Family Home                 DME Arranged: Tube feeding, Tube feeding pump DME Agency: Other - Comment([facility]) Date DME Agency Contacted: 11/02/19 Time DME Agency Contacted: 1243 Representative spoke with at DME Agency: [facility] Arranged: RN [place] Agency: [place] Date [place]: 11/02/19 Time [place]: 1247 Representative spoke with at [place]: [place] ([place]) Interventions    Readmission Risk Interventions No flowsheet data found.

## 2019-11-03 NOTE — Progress Notes (Signed)
CBG 57 @2038 , Gave Dextrose 50% solution 12.5g IV, CBG rechecked 119.

## 2019-11-03 NOTE — Plan of Care (Signed)
  Problem: Education: Goal: Knowledge of General Education information will improve Description Including pain rating scale, medication(s)/side effects and non-pharmacologic comfort measures Outcome: Progressing   

## 2019-11-03 NOTE — Progress Notes (Signed)
PROGRESS NOTE    [name]  [id number] DOB: 1976/04/09 DOA: 10/14/2019 PCP: [facility]  Brief Narrative: 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 -He was stabilized extubated and transferred to [facility] service from PCCM -Through his hospital course he was noted to have severe dysphagia, likely from progression of his chronic oropharyngeal dysphagia, requiring NG tube and tube feeds -Dysphagia, nutrition was discussed with family and decision made for PEG tube placement -also noted to have worsening of chronic anemia -PEG placed 1/7 -1/8: start TFs  Assessment & Plan:  Acute hypoxic respiratory failure -Secondary to septic shock/aspiration pneumonia and E. coli UTI -Resolved, extubated 12/27 -Stable on room air  Septic shock -Resolved, secondary to aspiration pneumonia +/-E. coli UTI -Stable off pressors, completed 7 days of antibiotics -Blood cultures negative  Encephalopathy -Due to septic shock, pneumonia/UTI -Improved back to baseline  Progressive dysphagia -Significantly worsened swallowing, SLP eval completed, noted to have progression of chronic dysphagia, not safe for oral intake, at risk of aspiration even with PEG tube -Was getting tube feeds via cortrak,  Dr. Verlon [name] discussed with patient's mother 2 days ago who agreed with PEG tube placement, I called and confirmed with patient's sister [name] who agreed -IR consulted for PEG tube placement, this was completed 1/7 -Start tube feeds today once cleared by IR  Acute on chronic/anemia of chronic disease and iron deficiency -Hemoglobin fluctuating between 6.5 and 8.5 this admission  -anemia panel suggestive of chronic disease and mild iron  deficiency -Was transfused 2 units of PRBC this admission -Given IV iron on 1/7 -Hemoglobin relatively stable, monitor  Developmental delay/mental retardation History of brain tumor status post resection/VP/shunt and history of seizures -Stable  Deep tissue pressure injuries -Wounds on his sacrum, right buttock, left medial heel and left ischial tuberosity -Continue wound care, air overlay mattress  Severe protein calorie malnutrition -Start tube feeds via PEG today  Hypoglycemia -Resolved off insulin and D10 drip -Restart tube feeds today  DVT prophylaxis: Lovenox Code Status: Full code Family Communication:No family at bedside, called and updated Sister [name] 1/7 Disposition Plan: Home pending stability following PEG tube placement, likely in 1 to 2 days  Consultants:   PCCM  IR   Procedures: PEG tube in IR 1/7  Antimicrobials:    Subjective: -Complains of being hungry, denies any pain or discomfort at the PEG tube site  Objective: Vitals:   11/02/19 1648 11/02/19 2038 11/03/19 0425 11/03/19 0913  BP: (!) _0 102/66  Pulse: 89 91 90 64  Resp: _1 Temp: 98.8 F (37.1 C) 97.8 F (36.6 C) 97.8 F (36.6 C) 98.2 F (36.8 C)  TempSrc: Oral  Oral Oral  SpO2: 99% 97% 99% 100%  Weight:      Height:        Intake/Output Summary (Last 24 hours) at 11/03/2019 1357 Last data filed at 11/03/2019 0900 Gross per 24 hour  Intake 89.51 ml  Output 1800 ml  Net -1710.49 ml   Filed Weights   10/25/19 2007 11/01/19 2033 11/02/19 0500  Weight: 70.3 kg 72.3 kg 72.3 kg    Examination:  Gen: Thinly built pleasant male with developmental delay delay, awake alert, cognitive deficits noted HEENT: PERRLA, Neck supple, no JVD  Lungs: Few basilar rhonchi, otherwise clear CVS: RRR,No Gallops,Rubs or new Murmurs Abd: soft, Non tender, non distended, BS present Extremities: No edema, PICC extremity contractures noted Skin: no new  rashes Psychiatry: Pleasant, poor insight and judgment   Data Reviewed:   CBC: Recent Labs  Lab 10/28/19 0604 10/29/19 0826 10/30/19 0545 10/31/19 0847 11/02/19 0354 11/03/19 0606  WBC 5.6 6.3 5.9 3.8* 3.6* 4.1  NEUTROABS 3.2  --  3.4 1.9  --   --   HGB 7.0* 6.5* 7.6* 8.1* 7.6* 7.7*  HCT 21.6* 20.8* 24.7* 26.1* 24.3* 24.6*  MCV 96.4 101.0* 98.0 98.5 100.0 98.0  PLT 326 353 367 338 276 025   Basic Metabolic Panel: Recent Labs  Lab 10/28/19 0604 10/31/19 0847 11/02/19 0354 11/03/19 0606  NA 146* 143 141 141  K 3.2* 4.2 4.7 4.2  CL 106 106 104 107  CO2 _0 GLUCOSE 97 103* 90 80  BUN _1 CREATININE 0.42* 0.41* 0.51* 0.50*  CALCIUM 7.6* 8.4* 8.5* 8.7*   GFR: Estimated Creatinine Clearance: 123 mL/min (A) (by C-G formula based on SCr of 0.5 mg/dL (L)). Liver Function Tests: No results for input(s): AST, ALT, ALKPHOS, BILITOT, PROT, ALBUMIN in the last 168 hours. No results for input(s): LIPASE, AMYLASE in the last 168 hours. No results for input(s): AMMONIA in the last 168 hours. Coagulation Profile: Recent Labs  Lab 11/02/19 0354  INR 1.1   Cardiac Enzymes: No results for input(s): CKTOTAL, CKMB, CKMBINDEX, TROPONINI in the last 168 hours. BNP (last 3 results) No results for input(s): PROBNP in the last 8760 hours. HbA1C: No results for input(s): HGBA1C in the last 72 hours. CBG: Recent Labs  Lab 11/02/19 2119 11/03/19 0011 11/03/19 0422 11/03/19 0729 11/03/19 1123  GLUCAP 119* 89 80 74 80   Lipid Profile: No results for input(s): CHOL, HDL, LDLCALC, TRIG, CHOLHDL, LDLDIRECT in the last 72 hours. Thyroid Function Tests: No results for input(s): TSH, T4TOTAL, FREET4, T3FREE, THYROIDAB in the last 72 hours. Anemia Panel: No results for input(s): VITAMINB12, FOLATE, FERRITIN, TIBC, IRON, RETICCTPCT in the last 72 hours. Urine analysis:    Component Value Date/Time   COLORURINE AMBER (A) 10/15/2019 0047   APPEARANCEUR CLOUDY (A)  10/15/2019 0047   LABSPEC 1.020 10/15/2019 0047   PHURINE 5.0 10/15/2019 0047   GLUCOSEU 50 (A) 10/15/2019 0047   HGBUR LARGE (A) 10/15/2019 0047   BILIRUBINUR NEGATIVE 10/15/2019 0047   KETONESUR 5 (A) 10/15/2019 0047   PROTEINUR 100 (A) 10/15/2019 0047   NITRITE NEGATIVE 10/15/2019 0047   LEUKOCYTESUR MODERATE (A) 10/15/2019 0047   Sepsis Labs: _2 (procalcitonin:4,lacticidven:4)  )No results found for this or any previous visit (from the past 240 hour(s)).       Radiology Studies: IR GASTROSTOMY TUBE MOD SED  Result Date: 11/02/2019 INDICATION: 44 year old with dysphagia.  Request for gastrostomy tube placement. EXAM: PERCUTANEOUS GASTROSTOMY TUBE WITH FLUOROSCOPIC GUIDANCE Physician: Stephan Minister. Anselm Pancoast, MD MEDICATIONS: Ancef 2 g; Antibiotics were administered within 1 hour of the procedure. Glucagon 0.5 mg IV ANESTHESIA/SEDATION: Versed 2.5 mg IV; Fentanyl 100 mcg IV Moderate Sedation Time:  17 minutes The patient was continuously monitored during the procedure by the interventional radiology nurse under my direct supervision. FLUOROSCOPY TIME:  Fluoroscopy Time: 3 minutes, 6 seconds, 6 mGy COMPLICATIONS: None immediate. PROCEDURE: Informed consent was obtained for a percutaneous gastrostomy tube. The patient was placed on the interventional table. Fluoroscopy demonstrated oral contrast in the transverse colon. An orogastric tube was  placed with fluoroscopic guidance. The anterior abdomen was prepped and draped in sterile fashion. Maximal barrier sterile technique was utilized including caps, mask, sterile gowns, sterile gloves, sterile drape, hand hygiene and skin antiseptic. Stomach was inflated with air through the orogastric tube. The skin and subcutaneous tissues were anesthetized with 1% lidocaine. A 17 gauge needle was directed into the distended stomach with fluoroscopic guidance. A wire was advanced into the stomach and a T-tact was deployed. A 9-French vascular sheath was placed  and the orogastric tube was snared using a Gooseneck snare device. The orogastric tube and snare were pulled out of the patient's mouth. The snare device was connected to a 20-French gastrostomy tube. The snare device and gastrostomy tube were pulled through the patient's mouth and out the anterior abdominal wall. The gastrostomy tube was cut to an appropriate length. Contrast injection through gastrostomy tube confirmed placement within the stomach. Fluoroscopic images were obtained for documentation. The gastrostomy tube was flushed with normal saline. IMPRESSION: Successful fluoroscopic guided percutaneous gastrostomy tube placement. Electronically Signed   By: Markus Daft M.D.   On: 11/02/2019 11:07        Scheduled Meds: . chlorhexidine gluconate (MEDLINE KIT)  15 mL Mouth Rinse BID  . Chlorhexidine Gluconate Cloth  6 each Topical Daily  . enoxaparin (LOVENOX) injection  40 mg Subcutaneous Q24H  . feeding supplement (OSMOLITE 1.5 CAL)  237 mL Per Tube 6 X Daily  . free water  250 mL Per Tube Q4H  . loperamide  2 mg Oral BID  . pantoprazole sodium  40 mg Per Tube Daily  . valproic acid  500 mg Per Tube BID   Continuous Infusions: . sodium chloride 10 mL/hr at 10/23/19 1300     LOS: 20 days    Time spent:  28mn  PDomenic Polite MD [facility]  11/03/2019, 1:57 PM

## 2019-11-04 LAB — GLUCOSE, CAPILLARY
Glucose-Capillary: 100 mg/dL — ABNORMAL HIGH (ref 70–99)
Glucose-Capillary: 121 mg/dL — ABNORMAL HIGH (ref 70–99)
Glucose-Capillary: 147 mg/dL — ABNORMAL HIGH (ref 70–99)
Glucose-Capillary: 80 mg/dL (ref 70–99)
Glucose-Capillary: 86 mg/dL (ref 70–99)
Glucose-Capillary: 90 mg/dL (ref 70–99)

## 2019-11-04 LAB — CBC
HCT: 24.2 % — ABNORMAL LOW (ref 39.0–52.0)
Hemoglobin: 7.9 g/dL — ABNORMAL LOW (ref 13.0–17.0)
MCH: 31.6 pg (ref 26.0–34.0)
MCHC: 32.6 g/dL (ref 30.0–36.0)
MCV: 96.8 fL (ref 80.0–100.0)
Platelets: 162 10*3/uL (ref 150–400)
RBC: 2.5 MIL/uL — ABNORMAL LOW (ref 4.22–5.81)
RDW: 15.1 % (ref 11.5–15.5)
WBC: 3.8 10*3/uL — ABNORMAL LOW (ref 4.0–10.5)
nRBC: 0 % (ref 0.0–0.2)

## 2019-11-04 LAB — BASIC METABOLIC PANEL
Anion gap: 10 (ref 5–15)
BUN: 14 mg/dL (ref 6–20)
CO2: 27 mmol/L (ref 22–32)
Calcium: 8.6 mg/dL — ABNORMAL LOW (ref 8.9–10.3)
Chloride: 103 mmol/L (ref 98–111)
Creatinine, Ser: 0.46 mg/dL — ABNORMAL LOW (ref 0.61–1.24)
GFR calc Af Amer: >60 mL/min (ref >60)
GFR calc non Af Amer: >60 mL/min (ref >60)
Glucose, Bld: 80 mg/dL (ref 70–99)
Potassium: 4.5 mmol/L (ref 3.5–5.1)
Sodium: 140 mmol/L (ref 135–145)

## 2019-11-04 MED ORDER — LOPERAMIDE HCL 1 MG/7.5ML PO SUSP
2.0000 mg | Freq: Every day | ORAL | Status: DC | PRN
  Filled 2019-11-04: qty 15

## 2019-11-04 MED ORDER — CHLORHEXIDINE GLUCONATE 0.12 % MT SOLN
OROMUCOSAL | Status: AC
  Filled 2019-11-04: qty 15

## 2019-11-04 MED ORDER — FERROUS SULFATE 220 (44 FE) MG/5ML PO ELIX
220.0000 mg | ORAL_SOLUTION | Freq: Two times a day (BID) | ORAL | Status: DC
  Administered 2019-11-04 – 2019-11-06 (×4): 220 mg
  Filled 2019-11-04 (×5): qty 5

## 2019-11-04 NOTE — Progress Notes (Signed)
PROGRESS NOTE    CHRISTIAAN STREBECK  FBP:[id number] DOB: 1976/07/27 DOA: 10/14/2019 PCP: [facility]  Brief Narrative: Mr. 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 -He was stabilized extubated and transferred to [facility] service from PCCM -Through his hospital course he was noted to have severe dysphagia, likely from progression of his chronic oropharyngeal dysphagia, requiring NG tube and tube feeds -Dysphagia, nutrition was discussed with family and decision made for PEG tube placement -also noted to have worsening of chronic anemia -PEG placed 1/7 -1/8: start TFs  Assessment & Plan:  Acute hypoxic respiratory failure -Secondary to septic shock/aspiration pneumonia and E. coli UTI -Resolved, extubated 12/27 -Stable on room air  Septic shock -Resolved, secondary to aspiration pneumonia +/-E. coli UTI -Stable off pressors, completed 7 days of antibiotics -Blood cultures negative  Encephalopathy -Due to septic shock, pneumonia/UTI -Improved back to baseline  Progressive dysphagia -Significantly worsened swallowing, SLP eval completed, noted to have progression of chronic dysphagia, not safe for oral intake, at risk of aspiration even with PEG tube -Was getting tube feeds via cortrak,  Dr. Verlon Au discussed with patient's mother 2 days ago who agreed with PEG tube placement, I called and confirmed with patient's sister Ledarius Leeson who agreed -IR consulted for PEG tube placement, this was completed 1/7 -Started tube feeds yesterday evening -Discharge planning  Acute on chronic/anemia of chronic disease and iron deficiency -Hemoglobin fluctuating between 6.5 and 8.5 this admission  -anemia panel suggestive of chronic disease  and mild iron deficiency -Was transfused 2 units of PRBC this admission -Given IV iron on 1/7 -Hemoglobin relatively stable, monitor -add oral Fe at DC  Developmental delay/mental retardation History of brain tumor status post resection/VP/shunt and history of seizures -Stable  Deep tissue pressure injuries -Wounds on his sacrum, right buttock, left medial heel and left ischial tuberosity -Continue wound care, air overlay mattress  Severe protein calorie malnutrition -Started tube feeds via PEG 1/8  Hypoglycemia -Resolved off insulin and D10 drip -Restart tube feeds today  DVT prophylaxis: Lovenox Code Status: Full code Family Communication:No family at bedside, called and updated Sister Kush Farabee 1/7 Disposition Plan: Home 1/10  Consultants:   PCCM  IR   Procedures: PEG tube in IR 1/7  Antimicrobials:    Subjective: -No events overnight, tolerating tube feeds, home health arranged for Sunday  Objective: Vitals:   11/03/19 1646 11/03/19 2027 11/04/19 0428 11/04/19 1012  BP: (!) 103/55 91/68 103/62 106/66  Pulse: 92 96 67 68  Resp: '18 18 20 18  '$ Temp: 98.2 F (36.8 C) 98.4 F (36.9 C) 98.2 F (36.8 C) 98.4 F (36.9 C)  TempSrc: Oral Oral Oral Oral  SpO2: 98% 96% 98% 98%  Weight:      Height:        Intake/Output Summary (Last 24 hours) at 11/04/2019 1100 Last data filed at 11/04/2019 0947 Gross per 24 hour  Intake 474 ml  Output 1726 ml  Net -1252 ml   Filed Weights   10/25/19 2007 11/01/19 2033 11/02/19 0500  Weight: 70.3 kg 72.3 kg 72.3 kg    Examination:  Gen: Thinly built pleasant male with developmental delay, awake alert, cognitive deficits noted HEENT: PERRLA, Neck supple, no JVD Lungs: Good air movement bilaterally, CTAB CVS: RRR,No Gallops,Rubs or new  Murmurs Abd: Soft, nontender, PEG tube noted  extremities: No edema, upper extremity contractures noted Skin: no new rashes Psychiatry: Pleasant, poor insight and judgment   Data  Reviewed:   CBC: Recent Labs  Lab 10/30/19 0545 10/31/19 0847 11/02/19 0354 11/03/19 0606 11/04/19 0415  WBC 5.9 3.8* 3.6* 4.1 3.8*  NEUTROABS 3.4 1.9  --   --   --   HGB 7.6* 8.1* 7.6* 7.7* 7.9*  HCT 24.7* 26.1* 24.3* 24.6* 24.2*  MCV 98.0 98.5 100.0 98.0 96.8  PLT 367 338 276 284 465   Basic Metabolic Panel: Recent Labs  Lab 10/31/19 0847 11/02/19 0354 11/03/19 0606 11/04/19 0415  NA 143 141 141 140  K 4.2 4.7 4.2 4.5  CL 106 104 107 103  CO2 '27 27 27 27  '$ GLUCOSE 103* 90 80 80  BUN '16 14 11 14  '$ CREATININE 0.41* 0.51* 0.50* 0.46*  CALCIUM 8.4* 8.5* 8.7* 8.6*   GFR: Estimated Creatinine Clearance: 123 mL/min (A) (by C-G formula based on SCr of 0.46 mg/dL (L)). Liver Function Tests: No results for input(s): AST, ALT, ALKPHOS, BILITOT, PROT, ALBUMIN in the last 168 hours. No results for input(s): LIPASE, AMYLASE in the last 168 hours. No results for input(s): AMMONIA in the last 168 hours. Coagulation Profile: Recent Labs  Lab 11/02/19 0354  INR 1.1   Cardiac Enzymes: No results for input(s): CKTOTAL, CKMB, CKMBINDEX, TROPONINI in the last 168 hours. BNP (last 3 results) No results for input(s): PROBNP in the last 8760 hours. HbA1C: No results for input(s): HGBA1C in the last 72 hours. CBG: Recent Labs  Lab 11/03/19 1633 11/03/19 2025 11/04/19 0013 11/04/19 0422 11/04/19 0718  GLUCAP 98 98 121* 80 147*   Lipid Profile: No results for input(s): CHOL, HDL, LDLCALC, TRIG, CHOLHDL, LDLDIRECT in the last 72 hours. Thyroid Function Tests: No results for input(s): TSH, T4TOTAL, FREET4, T3FREE, THYROIDAB in the last 72 hours. Anemia Panel: No results for input(s): VITAMINB12, FOLATE, FERRITIN, TIBC, IRON, RETICCTPCT in the last 72 hours. Urine analysis:    Component Value Date/Time   COLORURINE AMBER (A) 10/15/2019 0047   APPEARANCEUR CLOUDY (A) 10/15/2019 0047   LABSPEC 1.020 10/15/2019 0047   PHURINE 5.0 10/15/2019 0047   GLUCOSEU 50 (A) 10/15/2019  0047   HGBUR LARGE (A) 10/15/2019 0047   BILIRUBINUR NEGATIVE 10/15/2019 0047   KETONESUR 5 (A) 10/15/2019 0047   PROTEINUR 100 (A) 10/15/2019 0047   NITRITE NEGATIVE 10/15/2019 0047   LEUKOCYTESUR MODERATE (A) 10/15/2019 0047   Sepsis Labs: '@LABRCNTIP'$ (procalcitonin:4,lacticidven:4)  )No results found for this or any previous visit (from the past 240 hour(s)).       Radiology Studies: No results found.      Scheduled Meds: . chlorhexidine gluconate (MEDLINE KIT)  15 mL Mouth Rinse BID  . Chlorhexidine Gluconate Cloth  6 each Topical Daily  . enoxaparin (LOVENOX) injection  40 mg Subcutaneous Q24H  . feeding supplement (OSMOLITE 1.5 CAL)  237 mL Per Tube 6 X Daily  . free water  250 mL Per Tube Q4H  . loperamide HCl  2 mg Per Tube BID  . pantoprazole sodium  40 mg Per Tube Daily  . valproic acid  500 mg Per Tube BID   Continuous Infusions: . sodium chloride 10 mL/hr at 10/23/19 1300     LOS: 21 days    Time spent:  61mn  PDomenic Polite MD [facility]  11/04/2019, 11:00 AM

## 2019-11-04 NOTE — Plan of Care (Signed)
  Problem: Education: Goal: Knowledge of General Education information will improve Description Including pain rating scale, medication(s)/side effects and non-pharmacologic comfort measures Outcome: Progressing   

## 2019-11-05 LAB — CBC
HCT: 23.8 % — ABNORMAL LOW (ref 39.0–52.0)
Hemoglobin: 7.4 g/dL — ABNORMAL LOW (ref 13.0–17.0)
MCH: 30.3 pg (ref 26.0–34.0)
MCHC: 31.1 g/dL (ref 30.0–36.0)
MCV: 97.5 fL (ref 80.0–100.0)
Platelets: 237 10*3/uL (ref 150–400)
RBC: 2.44 MIL/uL — ABNORMAL LOW (ref 4.22–5.81)
RDW: 15.2 % (ref 11.5–15.5)
WBC: 3.5 10*3/uL — ABNORMAL LOW (ref 4.0–10.5)
nRBC: 0 % (ref 0.0–0.2)

## 2019-11-05 LAB — GLUCOSE, CAPILLARY
Glucose-Capillary: 100 mg/dL — ABNORMAL HIGH (ref 70–99)
Glucose-Capillary: 106 mg/dL — ABNORMAL HIGH (ref 70–99)
Glucose-Capillary: 117 mg/dL — ABNORMAL HIGH (ref 70–99)
Glucose-Capillary: 93 mg/dL (ref 70–99)
Glucose-Capillary: 99 mg/dL (ref 70–99)
Glucose-Capillary: 99 mg/dL (ref 70–99)

## 2019-11-05 MED ORDER — OSMOLITE 1.5 CAL PO LIQD: 237.0000 mL | Freq: Every day | ORAL | 1 refills | Status: AC

## 2019-11-05 MED ORDER — FREE WATER: 250.0000 mL | Status: AC

## 2019-11-05 MED ORDER — CHLORHEXIDINE GLUCONATE 0.12 % MT SOLN
OROMUCOSAL | Status: AC
  Filled 2019-11-05: qty 15

## 2019-11-05 MED ORDER — VALPROIC ACID 250 MG/5ML PO SOLN: 500.0000 mg | Freq: Two times a day (BID) | ORAL | 1 refills | Status: AC

## 2019-11-05 MED ORDER — PANTOPRAZOLE SODIUM 40 MG PO PACK: 40.0000 mg | PACK | Freq: Every day | ORAL | 0 refills | Status: AC

## 2019-11-05 MED ORDER — FERROUS SULFATE 220 (44 FE) MG/5ML PO ELIX: 220.0000 mg | ORAL_SOLUTION | Freq: Two times a day (BID) | ORAL | 1 refills | Status: AC

## 2019-11-05 MED ORDER — LOPERAMIDE HCL 1 MG/7.5ML PO SUSP: 2.0000 mg | Freq: Every day | ORAL | 0 refills | Status: AC | PRN

## 2019-11-05 MED ORDER — ACETAMINOPHEN 160 MG/5ML PO SOLN: 650.0000 mg | Freq: Four times a day (QID) | ORAL | 0 refills | Status: AC | PRN

## 2019-11-05 NOTE — Care Management (Signed)
    Durable Medical Equipment  (From admission, onward)         Start     Ordered   11/05/19 0943  For home use only DME Hospital bed  Once    Question Answer Comment  Length of Need Lifetime   Patient has (list medical condition): dysphagia   The above medical condition requires: Patient requires the ability to reposition immediately   Head must be elevated greater than: 30 degrees   Bed type Semi-electric   Support Surface: Gel Overlay      11/05/19 0942   11/03/19 0946  For home use only DME Tube feeding  Once    Comments: Nutrition Follow-up  DOCUMENTATION CODES:   Severe malnutrition in context of social or environmental circumstances, Underweight  INTERVENTION:   Tomorrow start bolus feedings:   -1 can of Osmolite 1.5 six times daily via PEG -Free water flushes 100 ml before and after each bolus   Provides: 2130 kcals, 90 grams protein, 1086 ml free water (2286 ml with free water flushes).   NUTRITION DIAGNOSIS:   Severe Malnutrition related to social / environmental circumstances(developmental delay) as evidenced by severe fat depletion, severe muscle depletion, percent weight loss(26.5% weight loss within the past year).  Ongoing  GOAL:   Patient will meet greater than or equal to 90% of their needs  Addressed via TF  MONITOR:   Vent status, TF tolerance, Labs, Skin, Weight trends  REASON FOR ASSESSMENT:   Consult Enteral/tube feeding initiation and management  ASSESSMENT:  44 yo male admitted with septic shock, severe dehydration, severe hypernatremia, suspected source UTI. Recent several days of decreased oral intake, decreased mental status. 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  12/24-CT brain- no intercranial hemorrhage 12/27-extubated  12/28- gastric Cortrak placed   Pt had PEG placed this am. Cortrak pulled out. Plan to start feeding tomorrow. Spoke with sister via  phone. Discussed how bolus feedings would be administered and how often. Sister reports they could make this work at home and would like to try this vs the pump. Discussed plan with case manager and home health.   Admission weight: 52.6 kg  Current weight: 72.3 kg   I/O: -16,104 ml since 12/24 UOP: 4,700 ml x 24 hrs   Medications: imodium Labs: CBG 74-95   11/03/19 0946

## 2019-11-05 NOTE — Care Management (Addendum)
09:30 In communication throughout the day yesterday with [facility], and [employer].  A service request was made to [employer] [facility] on 1/8 at 12:20 to exchange current hospital bed for a new one in better working order by this morning.  We have confirmed that the bed has not been delivered at the time of this note. The area manager for [employer], Jeral Pinch has been contacted to expedite delivery of bed. This CM has placed an order for a new bed. 2:30 [employer] not able to secure hospital bed today, even with order for new bed. I have notified MD, nurse, and will reach out to family.

## 2019-11-05 NOTE — Discharge Summary (Signed)
Physician Discharge Summary  [name] [id number] DOB: 1976/05/15 DOA: 10/14/2019  PCP: [facility]  Admit date: 10/14/2019 Discharge date: 11/05/2019  Time spent: 45 minutes  Recommendations for Outpatient Follow-up:  1. PCP in 1 week, continue tube feeds and free water 2. 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 Status post ventilator dependent respiratory failure Progressive dysphagia, now with PEG tube Anemia-due to iron deficiency and chronic disease Deep tissue pressure injuries  Discharge Condition: Stable  Diet recommendation: Tube feeds and free water via PEG tube  Filed Weights   10/25/19 2007 11/01/19 2033 11/02/19 0500  Weight: 70.3 kg 72.3 kg 72.3 kg    History of present illness:  [name] is a 44 year old male with history of mental retardation, seizures, hypertension, history of brain tumor status post resection with VP shunt was admitted to the ICU on 12/19 with septic shock from aspiration pneumonia and UTI, he required pressors and mechanical ventilation.  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 -He was stabilized extubated and transferred to [facility] service from [facility] -Through his hospital course he was noted to have severe dysphagia, likely from progression of his chronic oropharyngeal dysphagia, requiring NG tube and tube feeds -Dysphagia, nutrition was discussed with family and decision made for PEG tube placement -also noted to have  worsening of chronic anemia -PEG placed 1/7 and started tube feeds 1/8  Detailed course:  Acute hypoxic respiratory failure -Secondary to septic shock/aspiration pneumonia and E. coli UTI -Resolved, extubated 12/27 -Stable on room air  Septic shock -Resolved, secondary to aspiration pneumonia +/-E. coli UTI -Stable off pressors, completed 7 days of antibiotics -Blood cultures negative  Encephalopathy -Due to septic shock, pneumonia/UTI -Improved back to baseline  Progressive dysphagia -Significantly worsened swallowing, SLP eval completed, noted to have progression of chronic dysphagia, not safe for oral intake, at risk of aspiration even with PEG tube -Was getting tube feeds via cortrak,  Dr. Verlon Au discussed with patient's mother 2 days ago who agreed with PEG tube placement, I called and confirmed with patient's sister Jabre Wasco who agreed with PEG tube placement -IR consulted for PEG tube placement, this was completed 1/7 -Started tube feeds 1/8, tolerating this well -Home health nursing set up for assistance  Acute on chronic/anemia of chronic disease and iron deficiency -Hemoglobin fluctuating between 6.5 and 8.5 this admission  -anemia panel suggestive of chronic disease and mild iron deficiency -Was transfused 2 units of PRBC this admission -Given IV iron on 1/7 -Hemoglobin relatively stable in the 7.5-8 range, monitor -Added oral Fe at DC -Please check/monitor CBC at follow-up  Developmental delay/mental retardation History of brain tumor status post resection/VP/shunt and history of seizures -Stable  Deep tissue pressure injuries -Wounds on his sacrum, right buttock, left medial heel and left ischial tuberosity -Continue wound care, air overlay mattress  Severe protein calorie malnutrition -Started tube feeds via PEG 1/8, tolerating this without any issues  Hypoglycemia -Resolved off insulin and D10 drip -Restarted tube feeds     Discharge  Exam: Vitals:   11/05/19 0456 11/05/19 0937  BP: 99/65 106/68  Pulse: 79 88  Resp: 16 20  Temp: 99.1 F (37.3 C) 98.5 F (36.9 C)  SpO2: 97% 99%    General: Awake alert oriented x2, cognitive deficits Cardiovascular: S1-S2, regular rate rhythm Respiratory: Few basilar rales  Discharge Instructions   Discharge Instructions    Discharge instructions   Complete by: As directed    Tube feeds and Free water as listed   Increase activity slowly   Complete by: As directed      Allergies as of 11/05/2019   No Known Allergies     Medication List    STOP taking these medications   divalproex 125 MG capsule Commonly known as: DEPAKOTE SPRINKLE   divalproex 500 MG DR tablet Commonly known as: DEPAKOTE   olmesartan-hydrochlorothiazide 20-12.5 MG tablet Commonly known as: BENICAR HCT     TAKE these medications   acetaminophen 160 MG/5ML solution Commonly known as: TYLENOL Place 20.3 mLs (650 mg total) into feeding tube every 6 (six) hours as needed for mild pain, headache or fever.   feeding supplement (OSMOLITE 1.5 CAL) Liqd Place 237 mLs into feeding tube 6 (six) times daily. What changed:   how to take this  when to take this   ferrous sulfate 220 (44 Fe) MG/5ML solution Place 5 mLs (220 mg total) into feeding tube 2 (two) times daily with a meal.   free water Soln Place 250 mLs into feeding tube every 4 (four) hours.   loperamide HCl 1 MG/7.5ML suspension Commonly known as: IMODIUM Place 15 mLs (2 mg total) into feeding tube daily as needed for diarrhea or loose stools.   pantoprazole sodium 40 mg/20 mL Pack Commonly known as: PROTONIX Place 20 mLs (40 mg total) into feeding tube daily. Start taking on: November 06, 2019   valproic acid 250 MG/5ML solution Commonly known as: DEPAKENE Place 10 mLs (500 mg total) into feeding tube 2 (two) times daily.            Durable Medical Equipment  (From admission, onward)         Start     Ordered    11/05/19 0943  For home use only DME Hospital bed  Once    Question Answer Comment  Length of Need Lifetime   Patient has (list medical condition): dysphagia   The above medical condition requires: Patient requires the ability to reposition immediately   Head must be elevated greater than: 30 degrees   Bed type Semi-electric   Support Surface: Gel Overlay      11/05/19 0942   11/03/19 0946  For home use only DME Tube feeding  Once    Comments: Nutrition Follow-up  DOCUMENTATION CODES:   Severe malnutrition in context of social or environmental circumstances, Underweight  INTERVENTION:   Tomorrow start bolus feedings:   -1 can of Osmolite 1.5 six times daily via PEG -Free water flushes 100 ml before and after each bolus   Provides: 2130 kcals, 90 grams protein, 1086 ml free water (2286 ml with free water flushes).   NUTRITION DIAGNOSIS:   Severe Malnutrition related to social / environmental circumstances(developmental delay) as evidenced by severe fat depletion, severe muscle depletion, percent weight loss(26.5% weight loss within the past year).  Ongoing  GOAL:   Patient will meet greater than or equal to 90% of their needs  Addressed via TF  MONITOR:   Vent status, TF tolerance, Labs, Skin, Weight trends  REASON FOR ASSESSMENT:   Consult Enteral/tube feeding initiation and management  ASSESSMENT:  44 yo male admitted with septic shock, severe dehydration, severe hypernatremia, suspected source UTI. Recent several days of decreased oral intake, decreased mental status. 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  12/24-CT brain- no intercranial hemorrhage 12/27-extubated  12/28- gastric Cortrak placed   Pt had PEG placed this am. Cortrak pulled out. Plan to start feeding tomorrow. Spoke with sister via phone. Discussed how bolus feedings would be administered and how often. Sister reports  they could make this work at home and would like to try this vs the pump. Discussed plan with case manager and home health.   Admission weight: 52.6 kg  Current weight: 72.3 kg   I/O: -16,104 ml since 12/24 UOP: 4,700 ml x 24 hrs   Medications: imodium Labs: CBG 74-95   11/03/19 0946         No Known Allergies Follow-up Information    [facility]. Schedule an appointment as soon as possible for a visit in 1 week(s).   Contact information: [address] [place] [address] [phone]            The results of significant diagnostics from this hospitalization (including imaging, microbiology, ancillary and laboratory) are listed below for reference.    Significant Diagnostic Studies: CT HEAD WO CONTRAST  Result Date: 10/19/2019 CLINICAL DATA:  History of seizures and brain tumor resection. EXAM: CT HEAD WITHOUT CONTRAST TECHNIQUE: Contiguous axial images were obtained from the base of the skull through the vertex without intravenous contrast. COMPARISON:  Head CT 10/15/2019 FINDINGS: Brain: Postsurgical changes of the left parietal lobe with unchanged position of shunt catheter. No ventricular dilatation. The lateral ventricles are slightly decompressed compared to the prior study. No intracranial hemorrhage. There is mild leftward midline shift of the septum pellucidum, which may be due to decreased left-sided pressure in the presence of craniectomy defect. Vascular: No hyperdense vessel or unexpected calcification. Skull: Large left-sided craniectomy defect. Sinuses/Orbits: Mild mucosal thickening in the left maxillary sinus. Normal orbits. Other: Unchanged posterior intracranial abandoned catheter fragment. IMPRESSION: Slightly decreased size of the lateral ventricles with mild leftward deviation of the septum pellucidum. No intracranial hemorrhage. Electronically Signed   By: Ulyses Jarred M.D.   On: 10/19/2019 00:47   CT HEAD  WO CONTRAST  Result Date: 10/15/2019 CLINICAL DATA:  Initial evaluation for acute encephalopathy, decreased level of consciousness. EXAM: CT HEAD WITHOUT CONTRAST TECHNIQUE: Contiguous axial images were obtained from the base of the skull through the vertex without intravenous contrast. COMPARISON:  Comparison made with prior CT from 07/24/2019 as well as earlier studies. FINDINGS: Brain: Postoperative changes from prior left frontotemporal and parietal craniectomy with extensive underlying left temporoparietal encephalomalacia again seen, stable. A left parietal approach shunt catheter remains in stable position with tip terminating near the atrium of the left lateral ventricle. Additional orphaned shunt catheter seen at the posterior left parietal convexity. Large area of cystic encephalomalacia again seen communicating with the atrium/occipital horn of the left lateral ventricle, stable. Since previous exam, the cystic cavity is increased in size, with increased protrusion through the craniectomy defect as compared to prior (series 3, image 18). Finding raises the possibility for possible shunt malfunction. Ventricular size itself is relatively stable without hydrocephalus. Trace right-to-left midline shift related to volume loss is relatively unchanged. Underlying cerebral atrophy noted, stable. No acute intracranial hemorrhage. No acute large vessel territory infarct. No extra-axial fluid collection. Vascular: No hyperdense vessel. Scattered  vascular calcifications noted within the carotid siphons. Skull: Scalp soft tissues demonstrate no acute finding. Sequelae of prior left craniectomy. Shunt catheter within the left posterior scalp. Sinuses/Orbits: Globes and orbital soft tissues demonstrate no acute finding. Chronic mucoperiosteal thickening noted within the ethmoidal air cells and left maxillary sinus. Paranasal sinuses are otherwise clear. Mastoid air cells and middle ear cavities are well pneumatized  and free of fluid. Other: None. IMPRESSION: 1. Postoperative changes from prior left frontotemporal and parietal craniectomy with underlying encephalomalacia of and left parietal approach shunt catheter in place. There is increased size of an area of cystic encephalomalacia of within the left parietotemporal region, with increased protrusion at the craniectomy defect as compared to previous. Finding raises the possibility for possible shunt malfunction. No hydrocephalus. 2. Otherwise stable appearance of the brain with no other acute intracranial abnormality identified. 3. Chronic ethmoidal and maxillary sinusitis. Electronically Signed   By: Jeannine Boga M.D.   On: 10/15/2019 01:19   MR BRAIN WO CONTRAST  Result Date: 10/20/2019 CLINICAL DATA:  44 year old male with chronic CSF shunt, history of brain tumor resection as a child. Recent encephalopathy. Pupillary change - ophthalmoplegia. EXAM: MRI HEAD WITHOUT CONTRAST TECHNIQUE: Multiplanar, multiecho pulse sequences of the brain and surrounding structures were obtained without intravenous contrast. COMPARISON:  Recent head CTs 10/19/2019 and earlier. Brain MRI 12/16/2006. FINDINGS: Brain: Left posterior hemisphere susceptibility artifact. No restricted diffusion or evidence of acute infarction. Lateral and 3rd ventricle size has substantially increased since 2008, although stable since September this year. A 2018 head CT had a somewhat intermediate ventricle size, suggesting the change has been gradual. The cerebral aqueduct remains small. The 4th ventricle has also increased although not as much. No transependymal edema suspected. There was some abnormal right cerebellar peduncle region signal on the 2008 MRI, but there is now widespread abnormal T2 and FLAIR signal in the bilateral cerebellar peduncles, dorsal brainstem (series 15, image 8). There is also asymmetric new signal abnormality in the pons, including in the midline (image 9). No definite  superimposed chronic blood products. Overall severe brainstem atrophy since 2008 (compare sagittal series 14, image 13 today to series 2, image 6 in 2008. Some superimposed Wallerian degeneration from the chronic left hemisphere findings is noted although appears substantially progressed since 2018 (asymmetric midbrain volume now on image 11). There is also progressed and fairly widespread now nonspecific bilateral cerebral white matter T2 and FLAIR hyperintensity. On T2 this somewhat resembles multiple scattered chronic white matter lacune is a (series 15, image 17). Chronic posterior hemisphere encephalomalacia is superimposed and itself appears not significantly changed since 2008. Most of the left parietal, occipital, and posterior left temporal lobes are affected with a cystic cavity that communicates with the left lateral ventricle. Two shunts in the region appears stable since 2008 by MRI (series 15, images 17 and 15). The left thalamus and left internal capsule has partially atrophied since 2008, but otherwise the deep gray nuclei appear spared. No midline shift, evidence of mass lesion, or acute intracranial hemorrhage. Pituitary within normal limits. Vascular: Poor appearance of the distal left vertebral artery flow void is new since 2,080. Other Major intracranial vascular flow voids appear stable since that time. Skull and upper cervical spine: Suggestion of some spinal cord atrophy since 2008 on the sagittal images. Otherwise negative visible cervical spine. Hyperostosis of the calvarium with normal bone marrow signal. Sinuses/Orbits: Disconjugate gaze. There is some chronic architectural distortion in the area of the optic chiasm which is not clearly  identified. Intraorbital soft tissues seen within normal limits. Bilateral paranasal sinus air-fluid levels with only mild mucosal thickening. Hyperplastic sinuses. Other: New bilateral mastoid effusions since 2008. The patient is intubated in there is  some fluid layering in the pharynx. IMPRESSION: 1. There is evidence of distal Left Vertebral Artery poor flow or occlusion which is new since the 2008 MRI, although no acute or subacute infarct is identified. 2. Severe global brainstem atrophy since 2008, with new and widespread nonspecific abnormal brainstem and cerebellar peduncle T2/FLAIR signal. Probable associated spinal cord atrophy over the same time. Etiology is unclear. And sparing of most cerebellar parenchyma would seem to argue against an ischemic etiology secondary to #1. 3. Superimposed progressed bilateral cerebral white matter signal abnormality since 2008, nonspecific although on T2 somewhat resembles multiple chronic white matter lacunae. Partial atrophy of the left deep gray matter nuclei also since 2008. But the other chronic pronounced left hemisphere encephalomalacia seems stable. 4. Substantial ventricular enlargement since 2008, although likely gradual given the CT head appearance in 2018. No evidence of transependymal edema. Chronic CSF shunt noted. Electronically Signed   By: Genevie Ann M.D.   On: 10/20/2019 10:29   IR GASTROSTOMY TUBE MOD SED  Result Date: 11/02/2019 INDICATION: 44 year old with dysphagia.  Request for gastrostomy tube placement. EXAM: PERCUTANEOUS GASTROSTOMY TUBE WITH FLUOROSCOPIC GUIDANCE Physician: Stephan Minister. Anselm Pancoast, MD MEDICATIONS: Ancef 2 g; Antibiotics were administered within 1 hour of the procedure. Glucagon 0.5 mg IV ANESTHESIA/SEDATION: Versed 2.5 mg IV; Fentanyl 100 mcg IV Moderate Sedation Time:  17 minutes The patient was continuously monitored during the procedure by the interventional radiology nurse under my direct supervision. FLUOROSCOPY TIME:  Fluoroscopy Time: 3 minutes, 6 seconds, 6 mGy COMPLICATIONS: None immediate. PROCEDURE: Informed consent was obtained for a percutaneous gastrostomy tube. The patient was placed on the interventional table. Fluoroscopy demonstrated oral contrast in the transverse  colon. An orogastric tube was placed with fluoroscopic guidance. The anterior abdomen was prepped and draped in sterile fashion. Maximal barrier sterile technique was utilized including caps, mask, sterile gowns, sterile gloves, sterile drape, hand hygiene and skin antiseptic. Stomach was inflated with air through the orogastric tube. The skin and subcutaneous tissues were anesthetized with 1% lidocaine. A 17 gauge needle was directed into the distended stomach with fluoroscopic guidance. A wire was advanced into the stomach and a T-tact was deployed. A 9-French vascular sheath was placed and the orogastric tube was snared using a Gooseneck snare device. The orogastric tube and snare were pulled out of the patient's mouth. The snare device was connected to a 20-French gastrostomy tube. The snare device and gastrostomy tube were pulled through the patient's mouth and out the anterior abdominal wall. The gastrostomy tube was cut to an appropriate length. Contrast injection through gastrostomy tube confirmed placement within the stomach. Fluoroscopic images were obtained for documentation. The gastrostomy tube was flushed with normal saline. IMPRESSION: Successful fluoroscopic guided percutaneous gastrostomy tube placement. Electronically Signed   By: Markus Daft M.D.   On: 11/02/2019 11:07   DG CHEST PORT 1 VIEW  Result Date: 10/29/2019 CLINICAL DATA:  Pneumonia. EXAM: PORTABLE CHEST 1 VIEW COMPARISON:  October 23, 2019. FINDINGS: The heart size and mediastinal contours are within normal limits. Feeding tube is seen entering the stomach. Left-sided ventriculoperitoneal shunt is again noted. No pneumothorax or pleural effusion is noted. Minimal bibasilar subsegmental atelectasis is noted. The visualized skeletal structures are unremarkable. IMPRESSION: Minimal bibasilar subsegmental atelectasis. Feeding tube is seen entering the stomach. Electronically Signed  By: Marijo Conception M.D.   On: 10/29/2019 08:35   DG  Chest Port 1 View  Result Date: 10/23/2019 CLINICAL DATA:  Recent respiratory failure. EXAM: PORTABLE CHEST 1 VIEW COMPARISON:  Chest x-ray 10/22/2019 FINDINGS: The endotracheal tube has been removed. The NG tube has been removed. The left subclavian central venous catheter is stable. 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 The cardiac silhouette, mediastinal and hilar contours are within normal limits and stable. Persistent streaky bibasilar atelectasis versus infiltrate and small pleural effusions. IMPRESSION: 1. Removal of ET and NG tubes. 2. Stable left subclavian central venous catheter. 3. Streaky bibasilar atelectasis versus infiltrates and small pleural effusions. Electronically Signed   By: Marijo Sanes M.D.   On: 10/23/2019 06:29   AM DG Chest Port 1 View  Result Date: 10/22/2019 CLINICAL DATA:  44 year old male with respiratory failure. Recent sepsis. Chronic CSF shunt. EXAM: PORTABLE CHEST 1 VIEW COMPARISON:  10/21/2019 portable chest and earlier. FINDINGS: Portable AP semi upright view at 0541 hours. Stable endotracheal tube, left subclavian central line and enteric tube. Superimposed CSF shunt coursing through the left chest. Scoliosis. Normal cardiac size and mediastinal contours. Stable lung volumes with improved right lung base ventilation, now appearing clear. Continued veiling opacity at the left lung base is stable. No pulmonary edema or pneumothorax. Negative visible bowel gas pattern. IMPRESSION: 1.  Stable lines and tubes. 2. Normalized right lung base ventilation. Stable veiling opacity on the left suggesting a combination of pleural effusion and airspace disease. Electronically Signed   By: Genevie Ann M.D.   On: 10/22/2019 07:50   DG Chest Port 1 View  Result Date: 10/21/2019 CLINICAL DATA:  44 year old male with recent sepsis. Chronic CSF shunt, brain tumor resection as a child. Intubated. EXAM: PORTABLE CHEST 1 VIEW COMPARISON:  Portable chest 10/20/2019 and earlier. FINDINGS:  Portable AP semi upright view at 0540 hours. Endotracheal tube tip remains in good position between the level the clavicles and carina. Enteric tube in good position, side hole the level of the proximal gastric body. Stable left subclavian central line. Scoliosis. Mediastinal contours remain normal. Stable lung volumes. Left greater than right veiling lower lung opacity not significantly changed since 10/19/2019. Right lung ventilation has improved since 10/15/2019. No pneumothorax or pulmonary edema. IMPRESSION: 1.  Stable lines and tubes. 2. Continued left greater than right veiling lower lung opacity which might reflect a combination of pleural effusions and airspace disease due to either infection or atelectasis. Ventilation not significantly changed since 10/19/2019. Electronically Signed   By: Genevie Ann M.D.   On: 10/21/2019 08:30   DG Chest Port 1 View  Result Date: 10/20/2019 CLINICAL DATA:  Endotracheal tube placement. EXAM: PORTABLE CHEST 1 VIEW COMPARISON:  10/19/2019 FINDINGS: Patient is rotated to the left. Endotracheal tube has tip 4.8 cm above the carina. Enteric tube has tip over the stomach in the left upper quadrant and unchanged. Left subclavian central venous catheter has tip over the SVC. Left-sided VP shunt tubing unchanged. Lungs are adequately inflated and demonstrate hazy bilateral perihilar/bibasilar opacification without significant change in may be due to interstitial edema or infection. Likely small left effusion. Cardiomediastinal silhouette and remainder of the exam is unchanged. IMPRESSION: Stable hazy opacification over the perihilar bibasilar region which may be due to interstitial edema versus infection. Small left pleural effusion. Tubes and lines as described. Electronically Signed   By: Marin Olp M.D.   On: 10/20/2019 05:45   DG Chest [facility] 1 View  Result  Date: 10/19/2019 CLINICAL DATA:  Respiratory failure. EXAM: PORTABLE CHEST 1 VIEW COMPARISON:  10/15/2019  FINDINGS: The endotracheal tube is 3.8 cm above the carina. The NG tube is coursing down the esophagus and into the stomach. Left subclavian central venous catheter is stable. A left-sided ventriculoperitoneal shunt catheter is again noted. The cardiac silhouette, mediastinal and hilar contours are stable. Persistent bibasilar infiltrates.  Suspect small left effusion. IMPRESSION: 1. Stable support apparatus. 2. Persistent bilateral lower lobe infiltrates. Electronically Signed   By: Marijo Sanes M.D.   On: 10/19/2019 06:50   DG CHEST PORT 1 VIEW  Result Date: 10/15/2019 CLINICAL DATA:  Endotracheal tube placement. EXAM: PORTABLE CHEST 1 VIEW COMPARISON:  Radiograph earlier this day at 0317 hour FINDINGS: Endotracheal tube tip 4.2 cm from the carina. Left subclavian central line remains in place. No significant change in right perihilar and infrahilar airspace opacities. Left infrahilar opacities have slightly increased. Unchanged heart size. No pneumothorax or pleural effusion. IMPRESSION: 1. Endotracheal tube tip 4.2 cm from the carina. 2. Unchanged right perihilar and infrahilar airspace opacities. Left infrahilar opacities have slightly increased. Findings suspicious for pneumonia. Electronically Signed   By: Keith Rake M.D.   On: 10/15/2019 06:10   DG CHEST PORT 1 VIEW  Result Date: 10/15/2019 CLINICAL DATA:  Central line placement. EXAM: PORTABLE CHEST 1 VIEW COMPARISON:  Radiograph yesterday. FINDINGS: Left subclavian central venous catheter tip projects over the mid SVC. No pneumothorax. 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 Worsening airspace disease in the right mid lower lung zones since prior. Developing patchy opacity at the left lung base. Unchanged heart size and mediastinal contours. No visualized pleural fluid. Scoliotic curvature of spine. IMPRESSION: 1. Left subclavian central venous catheter tip projects over the mid SVC. No  pneumothorax. 2. Worsening airspace disease in the right mid and lower lung zones. Developing patchy opacity at the left lung base. Findings suspicious for pneumonia. Electronically Signed   By: Keith Rake M.D.   On: 10/15/2019 03:31   DG Chest Port 1 View  Result Date: 10/14/2019 CLINICAL DATA:  Shortness of breath.  Unresponsive. EXAM: PORTABLE CHEST 1 VIEW COMPARISON:  07/30/2019 and prior radiographs FINDINGS: New airspace disease within the mid and lower RIGHT lung noted. Cardiomediastinal silhouette is unchanged. The LEFT lung is clear. No pleural effusion or pneumothorax. A VP shunt overlying the LEFT chest is again identified. IMPRESSION: New RIGHT lung airspace disease suspicious for pneumonia or possibly aspiration. Electronically Signed   By: Margarette Canada M.D.   On: 10/14/2019 20:42   DG Abd Portable 1V  Result Date: 10/15/2019 CLINICAL DATA:  Orogastric tube placement. EXAM: PORTABLE ABDOMEN - 1 VIEW COMPARISON:  None. FINDINGS: An orogastric tube is seen with tip overlying the proximal gastric body. The bowel gas pattern is normal. Catheter noted within the bladder. VP shunt tubing also seen with tip in the right lower quadrant. Patient is rotated to the right. IMPRESSION: Orogastric tube tip overlies the proximal gastric body. Normal bowel gas pattern. Electronically Signed   By: Marlaine Hind M.D.   On: 10/15/2019 18:00   DG Swallowing Func-Speech Pathology  Result Date: 10/30/2019 Objective Swallowing Evaluation: Type of Study: MBS-Modified Barium Swallow Study  Patient Details Name: [name] MRN: [id number] Date of Birth: 10/16/1976 Today's Date: 10/30/2019 Time: SLP Start Time (ACUTE ONLY): [id number] -SLP Stop Time (ACUTE ONLY): 0953 SLP Time Calculation (min) (ACUTE ONLY): 14 min Past Medical History: Past Medical History: Diagnosis Date . Brain  tumor (benign) ([place])  . Hypertension  . Mental retardation, mild (I.Q. 50-70)  . Seizures ([place])  Past Surgical History: Past Surgical  History: Procedure Laterality Date . BRAIN SURGERY   HPI:  44 year old male with a childhood brain tumor and developmental delay as well as seizures disorder, admitted from home.  He presents with several days of decreased oral intake dehydration and decreased mentation and mental status from baseline.  Was admitted early the morning of 12/19 found to be in septic shock, severe dehydration with severe hypernatremia, with suspected source to be UTI.  He was intubated for worsening respiratory acidosis and clinical coma. (ETT 12/20-27). CT brain 12/24 Slightly decreased size of the lateral ventricles with mild leftward.  Pt had MBS when admitted to [facility] in Sept 2020, found to have dysphagia with silent aspiration and dysphagia 1/nectar liquids was recommended.  Subjective: Awake, alert, participative Assessment / Plan / Recommendation CHL IP CLINICAL IMPRESSIONS 10/30/2019 Clinical Impression Pt presents with severe oropharyngeal dysphagia c/b decreased lingual coordination, premature spillage, delayed swallow initiation, reduced base of tongue retraction, incomplete laryngeal closure, and diminished sensation.  These deficits resulted in silent aspiration of all consistencies trialed.  Aspiration of liquids typically occured during the swallow, while there was suspected aspiration of puree and simulated ground consistency residuals after the swallow.  There was a very infrequent cough response during this assessment.  Both reflexive and cued cough were insufficient to clear aspiration or penetration.  Following liquid bolus with heavier puree bolus was not consistently beneficial at clearing penetration.  Pt appears to have difficulty with secretion management, and although secretions could not be visualized 2/2 MBSS, suspect that secretions are mixing with residuals, thinning out thicker consistencies, and contributing to aspiration.  This appears to be a progression of chronic dysphagia which was noted in MBSS in  September with silent aspiration.  Recommend long term alternate means of nutrition; unfortunately, given secretion management issues, pt may remain at risk for aspiration of secretions and possible aspiration pneumonia even with PEG placement.  Recommend frequent, thorough oral care to reduce risk of aspiration pneumonia.  SLP will continue to follow for trial swallowing therapy.  Pt may not be a good candidate for swallowing therapy 2/2 cognitive impairments. SLP Visit Diagnosis Dysphagia, oropharyngeal phase (R13.12) Attention and concentration deficit following -- Frontal lobe and executive function deficit following -- Impact on safety and function Severe aspiration risk   CHL IP TREATMENT RECOMMENDATION 10/30/2019 Treatment Recommendations Therapy as outlined in treatment plan below   Prognosis 10/30/2019 Prognosis for Safe Diet Advancement [place] Barriers to Reach Goals Cognitive deficits;Severity of deficits;Time post onset Barriers/Prognosis Comment -- CHL IP DIET RECOMMENDATION 10/30/2019 SLP Diet Recommendations NPO;Alternative means - long-term Liquid Administration via -- Medication Administration -- Compensations -- Postural Changes --   CHL IP OTHER RECOMMENDATIONS 07/26/2019 Recommended Consults -- Oral Care Recommendations Oral care BID;Staff/trained caregiver to provide oral care Other Recommendations Order thickener from pharmacy   CHL IP FOLLOW UP RECOMMENDATIONS 10/30/2019 Follow up Recommendations (No Data)   CHL IP FREQUENCY AND DURATION 10/30/2019 Speech Therapy Frequency (ACUTE ONLY) min 2x/week Treatment Duration 2 weeks      CHL IP ORAL PHASE 10/30/2019 Oral Phase Impaired Oral - Pudding Teaspoon -- Oral - Pudding Cup -- Oral - Honey Teaspoon -- Oral - Honey Cup Decreased bolus cohesion;Premature spillage Oral - Nectar Teaspoon -- Oral - Nectar Cup Decreased bolus cohesion;Premature spillage Oral - Nectar Straw -- Oral - Thin Teaspoon -- Oral - Thin Cup Decreased bolus  cohesion;Premature spillage Oral -  Thin Straw -- Oral - Puree Decreased bolus cohesion;Premature spillage Oral - Mech Soft Decreased bolus cohesion;Premature spillage Oral - Regular -- Oral - Multi-Consistency -- Oral - Pill -- Oral Phase - Comment --  CHL IP PHARYNGEAL PHASE 10/30/2019 Pharyngeal Phase Impaired Pharyngeal- Pudding Teaspoon -- Pharyngeal -- Pharyngeal- Pudding Cup -- Pharyngeal -- Pharyngeal- Honey Teaspoon -- Pharyngeal -- Pharyngeal- Honey Cup Delayed swallow initiation-vallecula;Delayed swallow initiation-pyriform sinuses;Reduced airway/laryngeal closure;Reduced tongue base retraction;Penetration/Aspiration during swallow;Penetration/Apiration after swallow;Penetration/Aspiration before swallow;Trace aspiration;Pharyngeal residue - valleculae Pharyngeal Material enters airway, passes BELOW cords without attempt by patient to eject out (silent aspiration) Pharyngeal- Nectar Teaspoon -- Pharyngeal -- Pharyngeal- Nectar Cup Delayed swallow initiation-vallecula;Delayed swallow initiation-pyriform sinuses;Reduced airway/laryngeal closure;Reduced tongue base retraction;Penetration/Aspiration before swallow;Penetration/Aspiration during swallow;Trace aspiration Pharyngeal Material enters airway, passes BELOW cords without attempt by patient to eject out (silent aspiration) Pharyngeal- Nectar Straw -- Pharyngeal -- Pharyngeal- Thin Teaspoon -- Pharyngeal -- Pharyngeal- Thin Cup Delayed swallow initiation-pyriform sinuses;Reduced airway/laryngeal closure;Penetration/Aspiration during swallow Pharyngeal Material enters airway, passes BELOW cords without attempt by patient to eject out (silent aspiration) Pharyngeal- Thin Straw -- Pharyngeal -- Pharyngeal- Puree Delayed swallow initiation-pyriform sinuses;Reduced airway/laryngeal closure;Penetration/Aspiration during swallow;Penetration/Apiration after swallow Pharyngeal Material enters airway, passes BELOW cords without attempt by patient to eject out (silent aspiration) Pharyngeal-  Mechanical Soft Delayed swallow initiation-vallecula;Reduced tongue base retraction;Reduced airway/laryngeal closure;Penetration/Apiration after swallow;Penetration/Aspiration during swallow Pharyngeal Material enters airway, passes BELOW cords without attempt by patient to eject out (silent aspiration) Pharyngeal- Regular -- Pharyngeal -- Pharyngeal- Multi-consistency -- Pharyngeal -- Pharyngeal- Pill -- Pharyngeal -- Pharyngeal Comment --  CHL IP CERVICAL ESOPHAGEAL PHASE 10/30/2019 Cervical Esophageal Phase WFL Pudding Teaspoon -- Pudding Cup -- Honey Teaspoon -- Honey Cup -- Nectar Teaspoon -- Nectar Cup -- Nectar Straw -- Thin Teaspoon -- Thin Cup -- Thin Straw -- Puree -- Mechanical Soft -- Regular -- Multi-consistency -- Pill -- Cervical Esophageal Comment -- Celedonio Savage, MA, CCC-SLP Acute Rehabilitation Services Office: [phone] 10/30/2019, 10:43 AM              EEG adult  Result Date: 10/21/2019 Greta Doom, MD     10/22/2019  7:44 AM History: 44 yo M being evaluated for AMS Sedation: None Technique: This is a 21 channel routine scalp EEG performed at the bedside with bipolar and monopolar montages arranged in accordance to the international 10/20 system of electrode placement. One channel was dedicated to EKG recording. Background: The background is obscured during most of the recording by myogenic artifact. That being said, there is some background visible at times with a low voltage posterior dominant rhythm of 8 Hz seen briefly. There is some generalized irregular low voltage delta range activity intruding into the background as well. There is no rhythmic or epileptiform appearing activity, wither during the periods of quiescence or underneath the muscle. No sleep was seen. Photic stimulation: Physiologic driving is none EEG Abnormalities: 1) Generalized irregular slow activity. 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 Though limited by muscle artifact, no seizure or seizure predisposition recorded on this study. Please note that lack of epileptiform activity on EEG does not preclude the possibility of epilepsy. Roland Rack, MD [facility] [phone] If 7pm- 7am, please page neurology on call as listed in [place].    Microbiology: No results found for this or any previous visit (from the past 240  hour(s)).   Labs: Basic Metabolic Panel: Recent Labs  Lab 10/31/19 0847 11/02/19 0354 11/03/19 0606 11/04/19 0415  NA 143 141 141 140  K 4.2 4.7 4.2 4.5  CL 106 104 107 103  CO2 27 27 27 27   GLUCOSE 103* 90 80 80  BUN 16 14 11 14   CREATININE 0.41* 0.51* 0.50* 0.46*  CALCIUM 8.4* 8.5* 8.7* 8.6*   Liver Function Tests: No results for input(s): AST, ALT, ALKPHOS, BILITOT, PROT, ALBUMIN in the last 168 hours. No results for input(s): LIPASE, AMYLASE in the last 168 hours. No results for input(s): AMMONIA in the last 168 hours. CBC: Recent Labs  Lab 10/30/19 0545 10/31/19 0847 11/02/19 0354 11/03/19 0606 11/04/19 0415 11/05/19 0151  WBC 5.9 3.8* 3.6* 4.1 3.8* 3.5*  NEUTROABS 3.4 1.9  --   --   --   --   HGB 7.6* 8.1* 7.6* 7.7* 7.9* 7.4*  HCT 24.7* 26.1* 24.3* 24.6* 24.2* 23.8*  MCV 98.0 98.5 100.0 98.0 96.8 97.5  PLT 367 338 276 284 162 237   Cardiac Enzymes: No results for input(s): CKTOTAL, CKMB, CKMBINDEX, TROPONINI in the last 168 hours. BNP: BNP (last 3 results) No results for input(s): BNP in the last 8760 hours.  ProBNP (last 3 results) No results for input(s): PROBNP in the last 8760 hours.  CBG: Recent Labs  Lab 11/04/19 1954 11/05/19 0021 11/05/19 0349 11/05/19 0725 11/05/19 1119  GLUCAP 100* 100* 93 106* 99       Signed:  Domenic Polite MD.  [facility] 11/05/2019, 11:25 AM

## 2019-11-06 LAB — GLUCOSE, CAPILLARY
Glucose-Capillary: 102 mg/dL — ABNORMAL HIGH (ref 70–99)
Glucose-Capillary: 107 mg/dL — ABNORMAL HIGH (ref 70–99)
Glucose-Capillary: 115 mg/dL — ABNORMAL HIGH (ref 70–99)
Glucose-Capillary: 87 mg/dL (ref 70–99)

## 2019-11-06 NOTE — TOC Transition Note (Signed)
Transition of Care [facility]) - CM/SW Discharge Note   Patient Details  Name: [name] MRN: [id number] Date of Birth: [birth year]  [facility] CM/SW Contact:  Bartholomew Crews, RN Phone Number: [phone] 11/06/2019, 1:24 PM   Clinical Narrative:    Received call from [facility]. New hospital bed has been delivered. [facility] arranged for transport home. Tranpsort paperwork on chart. [facility] and [facility] notified. No further TOC needs identified.    Final next level of care: [place] Barriers to Discharge: No Barriers Identified   Patient Goals and CMS Choice Patient states their goals for this hospitalization and ongoing recovery are:: return home with family CMS Medicare.gov Compare Post Acute Care list provided to:: Patient Represenative (must comment) Choice offered to / list presented to : Sibling  Discharge Placement                       Discharge Plan and Services   Discharge Planning Services: CM Consult Post Acute Care Choice: Home Health          DME Arranged: Hospital bed DME Agency: [facility] Date DME Agency Contacted: 11/06/19 Time DME Agency Contacted: [phone] Representative spoke with at DME Agency: [place]: RN, Speech Therapy [place] Agency: [place] Date [place]: 11/06/19 Time [place]: 1111 Representative spoke with at [place]: [place] ([place]) Interventions     Readmission Risk Interventions No flowsheet data found.

## 2019-11-06 NOTE — Plan of Care (Signed)
  Problem: Nutrition: Goal: Adequate nutrition will be maintained Outcome: Progressing   Problem: Skin Integrity: Goal: Risk for impaired skin integrity will decrease Outcome: Progressing   

## 2019-11-06 NOTE — TOC Transition Note (Signed)
Transition of Care [facility]) - CM/SW Discharge Note   Patient Details  Name: [name] MRN: [id number] Date of Birth: 1976/10/25  [facility]) CM/SW Contact:  Bartholomew Crews, RN Phone Number: [phone] 11/06/2019, 11:11 AM   Clinical Narrative:    Spoke with liaison from [place] about bed delivery. New hospital bed to be delivered today. Spoke with patient's sister, Janett Billow, who confirmed that she received call from [place] last night about delivery of bed today but time could not be provided. Jessica to call NCM when bed is delivered. NCM to make arrangements with [facility] for transporation home once bed has been delivered. TOC team following for transition needs.    Final next level of care: [place] Barriers to Discharge: No Barriers Identified   Patient Goals and CMS Choice Patient states their goals for this hospitalization and ongoing recovery are:: return home with family CMS Medicare.gov Compare Post Acute Care list provided to:: Patient Represenative (must comment) Choice offered to / list presented to : Sibling  Discharge Placement                       Discharge Plan and Services   Discharge Planning Services: CM Consult Post Acute Care Choice: Home Health          DME Arranged: Hospital bed DME Agency: [facility] Date DME Agency Contacted: 11/06/19 Time DME Agency Contacted: [phone] Representative spoke with at DME Agency: [place]: RN, Speech Therapy [place] Agency: [place] Date [place]: 11/06/19 Time [place]: 1111 Representative spoke with at [place]: [place] ([place]) Interventions     Readmission Risk Interventions No flowsheet data found.

## 2019-11-06 NOTE — Plan of Care (Signed)

## 2019-12-25 DEATH — deceased

## 2020-06-25 IMAGING — DX DG CHEST 1V PORT
1 series · 1 of 1 positions shown · non-contrast
Comparison: Portable chest 10/20/2019 and earlier.

CLINICAL DATA: 43-year-old male with recent sepsis. Chronic CSF
shunt, brain tumor resection as a child. Intubated.

EXAM:
PORTABLE CHEST 1 VIEW

[chest ap]
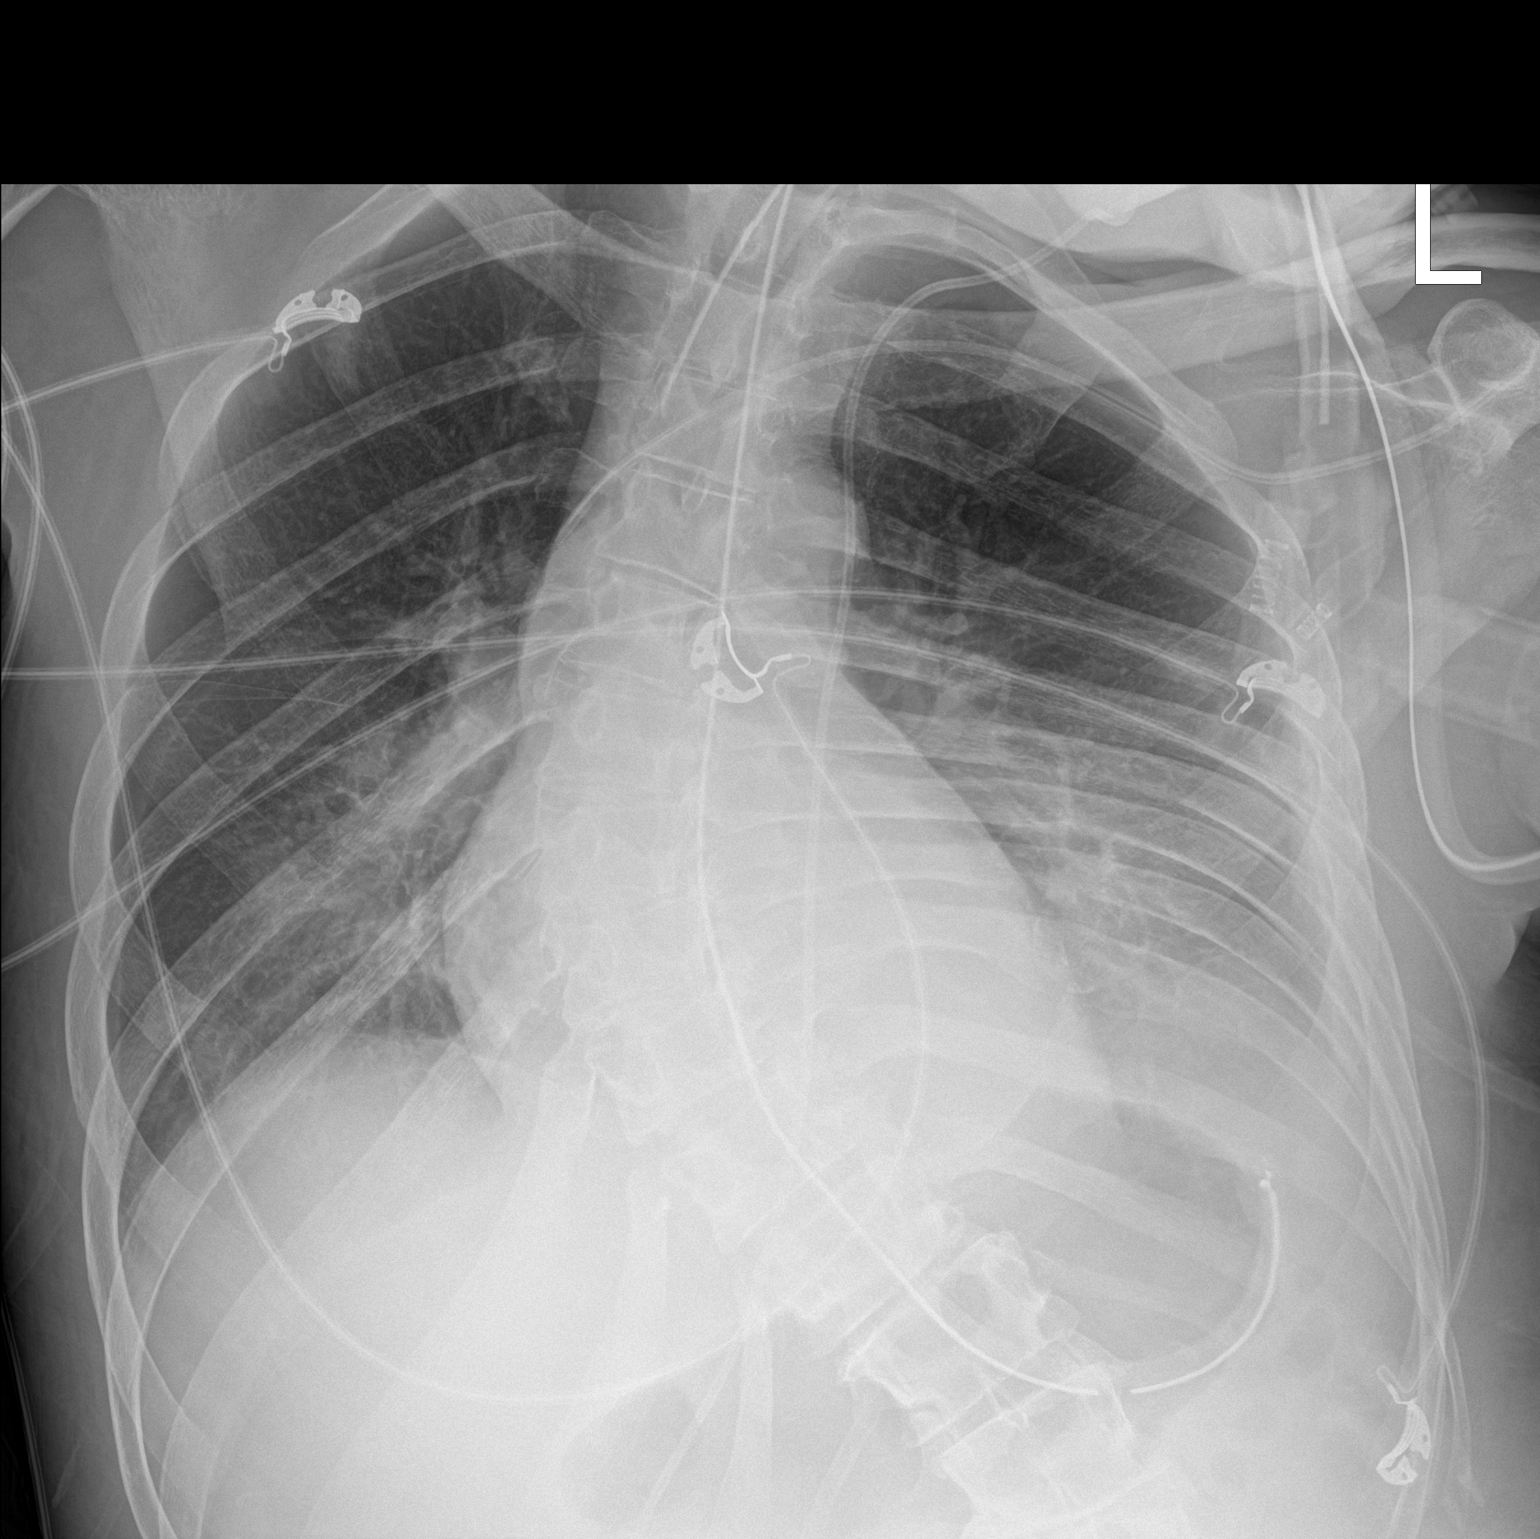

[1 of 1 positions shown; findings below may reference images not displayed]

FINDINGS: Portable AP semi upright view at 2002 hours. Endotracheal tube tip
remains in good position between the level the clavicles and carina.
Enteric tube in good position, side hole the level of the proximal
gastric body. Stable left subclavian central line.

Scoliosis. Mediastinal contours remain normal. Stable lung volumes.
Left greater than right veiling lower lung opacity not significantly
changed since 10/19/2019. Right lung ventilation has improved since
10/15/2019. No pneumothorax or pulmonary edema.
IMPRESSION: 1.  Stable lines and tubes.
2. Continued left greater than right veiling lower lung opacity
which might reflect a combination of pleural effusions and airspace
disease due to either infection or atelectasis.
Ventilation not significantly changed since 10/19/2019.

## 2021-07-24 NOTE — Telephone Encounter (Signed)
error
